# Patient Record
Sex: Male | Born: 1960 | Race: Black or African American | Hispanic: No | Marital: Married | Smoking: Former smoker
Health system: Southern US, Community
[De-identification: ages and names within clinical notes are randomized; demographics above are authoritative.]

## PROBLEM LIST (undated history)

## (undated) DIAGNOSIS — F1990 Other psychoactive substance use, unspecified, uncomplicated: Secondary | ICD-10-CM

## (undated) DIAGNOSIS — Z9981 Dependence on supplemental oxygen: Secondary | ICD-10-CM

## (undated) DIAGNOSIS — J90 Pleural effusion, not elsewhere classified: Secondary | ICD-10-CM

## (undated) DIAGNOSIS — F1010 Alcohol abuse, uncomplicated: Secondary | ICD-10-CM

## (undated) DIAGNOSIS — C801 Malignant (primary) neoplasm, unspecified: Secondary | ICD-10-CM

## (undated) DIAGNOSIS — F17200 Nicotine dependence, unspecified, uncomplicated: Secondary | ICD-10-CM

## (undated) HISTORY — DX: Malignant (primary) neoplasm, unspecified: C80.1

## (undated) HISTORY — PX: OTHER SURGICAL HISTORY: SHX169

---

## 1998-12-31 ENCOUNTER — Observation Stay (HOSPITAL_COMMUNITY): Admission: EM | Admit: 1998-12-31 | Discharge: 1998-12-31 | Payer: Self-pay | Admitting: Emergency Medicine

## 1999-01-13 ENCOUNTER — Emergency Department (HOSPITAL_COMMUNITY): Admission: EM | Admit: 1999-01-13 | Discharge: 1999-01-13 | Payer: Self-pay | Admitting: Emergency Medicine

## 2007-07-17 HISTORY — PX: HERNIA REPAIR: SHX51

## 2015-04-22 ENCOUNTER — Emergency Department (HOSPITAL_COMMUNITY): Payer: Medicaid Other

## 2015-04-22 ENCOUNTER — Inpatient Hospital Stay (HOSPITAL_COMMUNITY)
Admission: EM | Admit: 2015-04-22 | Discharge: 2015-04-28 | DRG: 166 | Disposition: A | Discharge status: Home / Self Care | Payer: Medicaid Other | Attending: Internal Medicine | Admitting: Internal Medicine

## 2015-04-22 ENCOUNTER — Encounter (HOSPITAL_COMMUNITY): Payer: Self-pay | Admitting: Emergency Medicine

## 2015-04-22 DIAGNOSIS — F17200 Nicotine dependence, unspecified, uncomplicated: Secondary | ICD-10-CM | POA: Diagnosis not present

## 2015-04-22 DIAGNOSIS — R079 Chest pain, unspecified: Secondary | ICD-10-CM

## 2015-04-22 DIAGNOSIS — J189 Pneumonia, unspecified organism: Secondary | ICD-10-CM | POA: Diagnosis present

## 2015-04-22 DIAGNOSIS — J441 Chronic obstructive pulmonary disease with (acute) exacerbation: Secondary | ICD-10-CM | POA: Diagnosis present

## 2015-04-22 DIAGNOSIS — F1020 Alcohol dependence, uncomplicated: Secondary | ICD-10-CM | POA: Diagnosis present

## 2015-04-22 DIAGNOSIS — Z6822 Body mass index (BMI) 22.0-22.9, adult: Secondary | ICD-10-CM

## 2015-04-22 DIAGNOSIS — C3411 Malignant neoplasm of upper lobe, right bronchus or lung: Secondary | ICD-10-CM | POA: Diagnosis present

## 2015-04-22 DIAGNOSIS — C801 Malignant (primary) neoplasm, unspecified: Secondary | ICD-10-CM | POA: Diagnosis not present

## 2015-04-22 DIAGNOSIS — J948 Other specified pleural conditions: Secondary | ICD-10-CM | POA: Diagnosis not present

## 2015-04-22 DIAGNOSIS — D72829 Elevated white blood cell count, unspecified: Secondary | ICD-10-CM | POA: Diagnosis not present

## 2015-04-22 DIAGNOSIS — F17210 Nicotine dependence, cigarettes, uncomplicated: Secondary | ICD-10-CM | POA: Diagnosis not present

## 2015-04-22 DIAGNOSIS — J690 Pneumonitis due to inhalation of food and vomit: Secondary | ICD-10-CM | POA: Diagnosis not present

## 2015-04-22 DIAGNOSIS — J9811 Atelectasis: Secondary | ICD-10-CM | POA: Diagnosis not present

## 2015-04-22 DIAGNOSIS — E440 Moderate protein-calorie malnutrition: Secondary | ICD-10-CM | POA: Diagnosis present

## 2015-04-22 DIAGNOSIS — R918 Other nonspecific abnormal finding of lung field: Secondary | ICD-10-CM | POA: Diagnosis not present

## 2015-04-22 DIAGNOSIS — J910 Malignant pleural effusion: Secondary | ICD-10-CM | POA: Diagnosis not present

## 2015-04-22 DIAGNOSIS — J984 Other disorders of lung: Secondary | ICD-10-CM | POA: Diagnosis not present

## 2015-04-22 DIAGNOSIS — J90 Pleural effusion, not elsewhere classified: Secondary | ICD-10-CM

## 2015-04-22 DIAGNOSIS — Z9889 Other specified postprocedural states: Secondary | ICD-10-CM

## 2015-04-22 HISTORY — DX: Nicotine dependence, unspecified, uncomplicated: F17.200

## 2015-04-22 LAB — CBC
HCT: 34 % — ABNORMAL LOW (ref 39.0–52.0)
Hemoglobin: 11.4 g/dL — ABNORMAL LOW (ref 13.0–17.0)
MCH: 29.2 pg (ref 26.0–34.0)
MCHC: 33.5 g/dL (ref 30.0–36.0)
MCV: 87.2 fL (ref 78.0–100.0)
PLATELETS: 641 10*3/uL — AB (ref 150–400)
RBC: 3.9 MIL/uL — AB (ref 4.22–5.81)
RDW: 14.5 % (ref 11.5–15.5)
WBC: 10.4 10*3/uL (ref 4.0–10.5)

## 2015-04-22 LAB — BASIC METABOLIC PANEL
Anion gap: 6 (ref 5–15)
BUN: 10 mg/dL (ref 6–20)
CALCIUM: 8.4 mg/dL — AB (ref 8.9–10.3)
CHLORIDE: 104 mmol/L (ref 101–111)
CO2: 27 mmol/L (ref 22–32)
CREATININE: 0.99 mg/dL (ref 0.61–1.24)
Glucose, Bld: 113 mg/dL — ABNORMAL HIGH (ref 65–99)
Potassium: 4.1 mmol/L (ref 3.5–5.1)
SODIUM: 137 mmol/L (ref 135–145)

## 2015-04-22 LAB — HEPATIC FUNCTION PANEL
ALBUMIN: 2.7 g/dL — AB (ref 3.5–5.0)
ALT: 11 U/L — ABNORMAL LOW (ref 17–63)
AST: 16 U/L (ref 15–41)
Alkaline Phosphatase: 87 U/L (ref 38–126)
BILIRUBIN TOTAL: 0.2 mg/dL — AB (ref 0.3–1.2)
Bilirubin, Direct: <0.1 mg/dL — ABNORMAL LOW (ref 0.1–0.5)
TOTAL PROTEIN: 6.6 g/dL (ref 6.5–8.1)

## 2015-04-22 LAB — TROPONIN I

## 2015-04-22 MED ORDER — IOHEXOL 300 MG/ML  SOLN
75.0000 mL | Freq: Once | INTRAMUSCULAR | Status: AC | PRN
  Administered 2015-04-22: 100 mL via INTRAVENOUS

## 2015-04-22 MED ORDER — SODIUM CHLORIDE 0.9 % IV SOLN
INTRAVENOUS | Status: DC
  Administered 2015-04-22 – 2015-04-23 (×3): via INTRAVENOUS

## 2015-04-22 MED ORDER — DEXTROSE 5 % IV SOLN
1.0000 g | INTRAVENOUS | Status: DC
  Administered 2015-04-22 – 2015-04-26 (×5): 1 g via INTRAVENOUS
  Filled 2015-04-22 (×7): qty 10

## 2015-04-22 MED ORDER — ASPIRIN 81 MG PO CHEW
324.0000 mg | CHEWABLE_TABLET | Freq: Once | ORAL | Status: AC
  Administered 2015-04-22: 324 mg via ORAL
  Filled 2015-04-22: qty 4

## 2015-04-22 MED ORDER — DEXTROSE 5 % IV SOLN
500.0000 mg | INTRAVENOUS | Status: DC
  Administered 2015-04-22 – 2015-04-26 (×5): 500 mg via INTRAVENOUS
  Filled 2015-04-22 (×8): qty 500

## 2015-04-22 MED ORDER — SODIUM CHLORIDE 0.9 % IV BOLUS (SEPSIS)
250.0000 mL | Freq: Once | INTRAVENOUS | Status: AC
Start: 2015-04-22 — End: 2015-04-22
  Administered 2015-04-22: 250 mL via INTRAVENOUS

## 2015-04-22 NOTE — H&P (Signed)
[facility] History and Physical  Marsalis Beaulieu GLO:[id number] DOB: 07-Sep-1960 DOA: 04/22/2015  Referring physician: Emergency department physician PCP: None   Chief Complaint: Chest pain  HPI: Brian Nelson is a 54 y.o. male  With no past medical history comes to the emergency department with the complaints of chest pain on the right side of the chest started since yesterday. The pain is sharp in nature 7 x 10 intensity, radiates to right shoulder. Workup in the emergency department the chest x-ray was concerning about pneumothorax. Patient underwent CTA of the chest which demonstrated a large right pleural effusion concerning about empyema, large infiltrate concerning about pneumonia commented as cannot exclude malignancy. Patient has underlying severe emphysema with multiple bullae largest being 12 cm. Patient smokes 2 packs a day. Patient states, has been having cough for the last 1 month with occasional blood-tinged sputum. Patient also states that has been having poor appetite with some loss of weight. Patient states does not weigh himself regularly. Concerning about the large pleural effusion concerning about empyema and infiltrate pretty much involving the entire right lung patient will need further evaluation with a thoracentesis, pulmonary consult, possible CT surgery consult. Patient denies having any fever. Patient does not have any elevated white blood cell count. Denied any shortness of breath.   Review of Systems:  Constitutional:  Positive for weight loss,  fatigue.  HEENT:  No headaches, Difficulty swallowing,Tooth/dental problems,Sore throat,  No sneezing, itching, ear ache, nasal congestion, post nasal drip,  Cardio-vascular:  Positive for chest pain, no Orthopnea, PND, swelling in lower extremities, anasarca, dizziness, palpitations  GI:  No heartburn, indigestion, abdominal pain, nausea, vomiting, diarrhea, change in bowel habits, loss of appetite  Resp:  No  shortness of breath with exertion or at rest. No excess mucus, no productive cough, No non-productive cough, No coughing up of blood.No change in color of mucus.No wheezing.No chest wall deformity  Skin:  no rash or lesions.  GU:  no dysuria, change in color of urine, no urgency or frequency. No flank pain.  Musculoskeletal:  No joint pain or swelling. No decreased range of motion. No back pain.  Psych:  No change in mood or affect. No depression or anxiety. No memory loss.   History reviewed. No pertinent past medical history. History reviewed. No pertinent past surgical history. Social History:  reports that he has been smoking Cigarettes.  He has been smoking about 2.00 packs per day. He has never used smokeless tobacco. He reports that he drinks alcohol. His drug history is not on file.  No Known Allergies  Family History  Problem Relation Age of Onset  . Diabetes Mother      Prior to Admission medications   Not on File   Physical Exam: Filed Vitals:   04/22/15 2100 04/22/15 2130 04/22/15 2200 04/22/15 2230  BP: 144/81 140/79 158/98 130/86  Pulse: 90 71  81  Temp:      TempSrc:      Resp: '20 20 18 18  '$ Height:      Weight:      SpO2: 98% 95%  96%    Wt Readings from Last 3 Encounters:  04/22/15 68.04 kg (150 lb)    General:  Appears calm and comfortable Eyes: PERRL, normal lids, irises & conjunctiva ENT: grossly normal hearing, lips & tongue Neck: no LAD, masses or thyromegaly Cardiovascular: RRR, no m/r/g. No LE edema. Telemetry: SR, no arrhythmias  Respiratory: Decreased breath sounds in the right lower lobe, bronchial breath sounds  on the right lung Abdomen: soft, ntnd Skin: no rash or induration seen on limited exam Musculoskeletal: grossly normal tone BUE/BLE Psychiatric: grossly normal mood and affect, speech fluent and appropriate Neurologic: grossly non-focal.          Labs on Admission:  Basic Metabolic Panel:  Recent Labs Lab 04/22/15 1953    NA 137  K 4.1  CL 104  CO2 27  GLUCOSE 113*  BUN 10  CREATININE 0.99  CALCIUM 8.4*   Liver Function Tests:  Recent Labs Lab 04/22/15 1953  AST 16  ALT 11*  ALKPHOS 87  BILITOT 0.2*  PROT 6.6  ALBUMIN 2.7*   No results for input(s): LIPASE, AMYLASE in the last 168 hours. No results for input(s): AMMONIA in the last 168 hours. CBC:  Recent Labs Lab 04/22/15 1953  WBC 10.4  HGB 11.4*  HCT 34.0*  MCV 87.2  PLT 641*   Cardiac Enzymes:  Recent Labs Lab 04/22/15 1953  TROPONINI <0.03    BNP (last 3 results) No results for input(s): BNP in the last 8760 hours.  ProBNP (last 3 results) No results for input(s): PROBNP in the last 8760 hours.  CBG: No results for input(s): GLUCAP in the last 168 hours.  Radiological Exams on Admission: Dg Chest 2 View  04/22/2015   CLINICAL DATA:  Right-sided chest pain and shortness of breath since yesterday.  EXAM: CHEST  2 VIEW  COMPARISON:  None.  FINDINGS: Cardiomediastinal silhouette is normal. Mediastinal contours appear intact.  The lungs are hyperinflated, and there is evidence of emphysematous changes with upper lobe predominance. There is a lucency within the left lung apex, which may represent a pneumothorax versus bullous changes. There is a small left pleural effusion. There is a large right pleural effusion. There is near complete opacification of the right upper lobe of the lung.  Osseous structures are without acute abnormality. Soft tissues are grossly normal.  IMPRESSION: Lung emphysema.  Left apical pneumothorax versus bullous changes of the lung parenchyma. Small left pleural effusion.  Large right pleural effusion. Near complete opacification of the right upper lobe of the lung. This may represent infectious consolidation, however endobronchial lesion causing postobstructive pneumonia cannot be excluded.  CT of the chest may be considered.   Electronically Signed   By: Fidela Salisbury M.D.   On: 04/22/2015 21:11    Ct Chest W Contrast  04/22/2015   CLINICAL DATA:  Intermittent right-sided chest pain and shortness of breath beginning yesterday. History of emphysema.  EXAM: CT CHEST WITH CONTRAST  TECHNIQUE: Multidetector CT imaging of the chest was performed during intravenous contrast administration.  CONTRAST:  155m OMNIPAQUE IOHEXOL 300 MG/ML  SOLN  COMPARISON:  Radiography same day  FINDINGS: There is a background pattern of advanced emphysema I with extensive bullous changes in the upper lobes. On the left, there is a dominant bulla measuring 12 cm in diameter. In the left lung, there are a few foci of nodular density measuring up to 1 cm in size which will require follow-up after treatment of this acute episode. Some of these could meet criteria for suspicion of malignancy.  On the right, there is a large effusion primarily loculated at the base worrisome for empyema. There is considerable volume loss in the right lower lung because of that. There is consolidation in the right upper lobe with fluid filling Bas lie at the apex consistent with advanced right upper lobe pneumonia. Certainly, a mass lesion could be hidden and this area  will will need to be evaluated after treatment. There are slightly prominent lymph nodes in the mediastinum that are likely reactive to the inflammatory process.  There is no pneumothorax. Scans in the upper abdomen do not show any abnormality.  IMPRESSION: Advanced right upper lobe pneumonia. Extensive empyema at the right lung base. Compressive atelectasis of the right lung.  Background pattern of centrilobular emphysema with a 12 cm bulla at the left lung apex. Areas of abnormal density in the left lung which will require follow-up after treatment of this acute inflammatory process. Some of these made meet criteria for suspicion for lung cancer.   Electronically Signed   By: Nelson Chimes M.D.   On: 04/22/2015 22:08    EKG: Independently reviewed. Normal sinus  rhythm  Assessment/Plan Active Problems:  Pleural effusion on right Highly concerning about malignancy Cannot excuse infection Transfer the patient to  [facility] Thoracentesis Fluid for analysis for cell count, pH, protein, LDH, cytology, cultures If concerning for empyema patient will need to have CT surgery consult  Right lung infiltrate Keep the patient on antibodies covering for community-acquired pneumonia with Rocephin and Zithromax Concerning about large infiltrate concerning about malignancy Consults pulmonology Patient is afebrile, normal white blood cell count, does not look toxic  COPD exacerbation mild Keep the patient on duonebs, Solu-Medrol  Tobacco abuse Consult with the patient for 4 minutes Keep the patient on nicotine patch  Alcohol use Patient could not quantify Keep the patient on thiamine  Thrombocytosis Most likely reactive  Protein malnutrition mild Has albumin of 2.7   Keep the patient on DVT prophylaxis with Lovenox    Code Status: Full DVT Prophylaxis: Lovenox Family Communication: Patient Disposition Plan: Home Time spent: 60 minutes  [facility] Pager [phone]

## 2015-04-22 NOTE — ED Provider Notes (Addendum)
CSN: [id number]     Arrival date & time 04/22/15  1917 History   First MD Initiated Contact with Patient 04/22/15 1938     Chief Complaint  Patient presents with  . Chest Pain     (Consider location/radiation/quality/duration/timing/severity/associated sxs/prior Treatment) Patient is a 54 y.o. male presenting with chest pain. The history is provided by the patient and the spouse.  Chest Pain Associated symptoms: back pain and shortness of breath   Associated symptoms: no abdominal pain, no cough, no fever, no headache, no nausea and not vomiting    patient with onset of intermittent substernal chest pain yesterday associated with shortness of breath. Pain last no more than 5 minutes. Nonradiating. Patient states not severe may be 6 out of 10 sometimes less. Also with a complaint of right posterior shoulder pain for a month. No fevers no nausea or vomiting no diaphoresis. No appetite change. Patient is a long-term smoker. No history of any cardiac problems however patient does not have a primary care doctor.  History reviewed. No pertinent past medical history. History reviewed. No pertinent past surgical history. Family History  Problem Relation Age of Onset  . Diabetes Mother    Social History  Substance Use Topics  . Smoking status: Current Every Day Smoker -- 2.00 packs/day    Types: Cigarettes  . Smokeless tobacco: Never Used  . Alcohol Use: Yes    Review of Systems  Constitutional: Negative for fever and unexpected weight change.  HENT: Negative for congestion.   Eyes: Negative for visual disturbance.  Respiratory: Positive for shortness of breath. Negative for cough.   Cardiovascular: Positive for chest pain.  Gastrointestinal: Negative for nausea, vomiting, abdominal pain and diarrhea.  Genitourinary: Negative for dysuria.  Musculoskeletal: Positive for back pain.  Skin: Negative for rash.  Neurological: Negative for headaches.  Hematological: Does not bruise/bleed  easily.  Psychiatric/Behavioral: Negative for confusion.      Allergies  Review of patient's allergies indicates no known allergies.  Home Medications   Prior to Admission medications   Not on File   BP 140/79 mmHg  Pulse 71  Temp(Src) 98.6 F (37 C) (Oral)  Resp 20  Ht '5\' 8"'$  (1.727 m)  Wt 150 lb (68.04 kg)  BMI 22.81 kg/m2  SpO2 95% Physical Exam  Constitutional: He appears well-developed and well-nourished. No distress.  HENT:  Head: Normocephalic and atraumatic.  Mouth/Throat: Oropharynx is clear and moist.  Eyes: Conjunctivae and EOM are normal. Pupils are equal, round, and reactive to light.  Neck: Normal range of motion.  Cardiovascular: Normal rate, regular rhythm and normal heart sounds.   No murmur heard. Pulmonary/Chest: Effort normal and breath sounds normal. No respiratory distress.  Decreased breath sounds on the right  Abdominal: Bowel sounds are normal.  Musculoskeletal: Normal range of motion. He exhibits no edema.  Neurological: He is alert. No cranial nerve deficit. He exhibits normal muscle tone. Coordination normal.  Skin: Skin is warm. No rash noted.  Nursing note and vitals reviewed.   ED Course  Procedures (including critical care time) Labs Review Labs Reviewed  BASIC METABOLIC PANEL - Abnormal; Notable for the following:    Glucose, Bld 113 (*)    Calcium 8.4 (*)    All other components within normal limits  CBC - Abnormal; Notable for the following:    RBC 3.90 (*)    Hemoglobin 11.4 (*)    HCT 34.0 (*)    Platelets 641 (*)    All other components within  normal limits  HEPATIC FUNCTION PANEL - Abnormal; Notable for the following:    Albumin 2.7 (*)    ALT 11 (*)    Total Bilirubin 0.2 (*)    Bilirubin, Direct <0.1 (*)    All other components within normal limits  TROPONIN I   Results for orders placed or performed during the hospital encounter of 27/03/50  Basic metabolic panel  Result Value Ref Range   Sodium 137 135 - 145  mmol/L   Potassium 4.1 3.5 - 5.1 mmol/L   Chloride 104 101 - 111 mmol/L   CO2 27 22 - 32 mmol/L   Glucose, Bld 113 (H) 65 - 99 mg/dL   BUN 10 6 - 20 mg/dL   Creatinine, Ser 0.99 0.61 - 1.24 mg/dL   Calcium 8.4 (L) 8.9 - 10.3 mg/dL   GFR calc non Af Amer >60 >60 mL/min   GFR calc Af Amer >60 >60 mL/min   Anion gap 6 5 - 15  CBC  Result Value Ref Range   WBC 10.4 4.0 - 10.5 K/uL   RBC 3.90 (L) 4.22 - 5.81 MIL/uL   Hemoglobin 11.4 (L) 13.0 - 17.0 g/dL   HCT 34.0 (L) 39.0 - 52.0 %   MCV 87.2 78.0 - 100.0 fL   MCH 29.2 26.0 - 34.0 pg   MCHC 33.5 30.0 - 36.0 g/dL   RDW 14.5 11.5 - 15.5 %   Platelets 641 (H) 150 - 400 K/uL  Troponin I  Result Value Ref Range   Troponin I <0.03 <0.031 ng/mL  Hepatic function panel  Result Value Ref Range   Total Protein 6.6 6.5 - 8.1 g/dL   Albumin 2.7 (L) 3.5 - 5.0 g/dL   AST 16 15 - 41 U/L   ALT 11 (L) 17 - 63 U/L   Alkaline Phosphatase 87 38 - 126 U/L   Total Bilirubin 0.2 (L) 0.3 - 1.2 mg/dL   Bilirubin, Direct <0.1 (L) 0.1 - 0.5 mg/dL   Indirect Bilirubin NOT CALCULATED 0.3 - 0.9 mg/dL     Imaging Review Dg Chest 2 View  04/22/2015   CLINICAL DATA:  Right-sided chest pain and shortness of breath since yesterday.  EXAM: CHEST  2 VIEW  COMPARISON:  None.  FINDINGS: Cardiomediastinal silhouette is normal. Mediastinal contours appear intact.  The lungs are hyperinflated, and there is evidence of emphysematous changes with upper lobe predominance. There is a lucency within the left lung apex, which may represent a pneumothorax versus bullous changes. There is a small left pleural effusion. There is a large right pleural effusion. There is near complete opacification of the right upper lobe of the lung.  Osseous structures are without acute abnormality. Soft tissues are grossly normal.  IMPRESSION: Lung emphysema.  Left apical pneumothorax versus bullous changes of the lung parenchyma. Small left pleural effusion.  Large right pleural effusion. Near  complete opacification of the right upper lobe of the lung. This may represent infectious consolidation, however endobronchial lesion causing postobstructive pneumonia cannot be excluded.  CT of the chest may be considered.   Electronically Signed   By: Fidela Salisbury M.D.   On: 04/22/2015 21:11   Ct Chest W Contrast  04/22/2015   CLINICAL DATA:  Intermittent right-sided chest pain and shortness of breath beginning yesterday. History of emphysema.  EXAM: CT CHEST WITH CONTRAST  TECHNIQUE: Multidetector CT imaging of the chest was performed during intravenous contrast administration.  CONTRAST:  179m OMNIPAQUE IOHEXOL 300 MG/ML  SOLN  COMPARISON:  Radiography same day  FINDINGS: There is a background pattern of advanced emphysema I with extensive bullous changes in the upper lobes. On the left, there is a dominant bulla measuring 12 cm in diameter. In the left lung, there are a few foci of nodular density measuring up to 1 cm in size which will require follow-up after treatment of this acute episode. Some of these could meet criteria for suspicion of malignancy.  On the right, there is a large effusion primarily loculated at the base worrisome for empyema. There is considerable volume loss in the right lower lung because of that. There is consolidation in the right upper lobe with fluid filling Bas lie at the apex consistent with advanced right upper lobe pneumonia. Certainly, a mass lesion could be hidden and this area will will need to be evaluated after treatment. There are slightly prominent lymph nodes in the mediastinum that are likely reactive to the inflammatory process.  There is no pneumothorax. Scans in the upper abdomen do not show any abnormality.  IMPRESSION: Advanced right upper lobe pneumonia. Extensive empyema at the right lung base. Compressive atelectasis of the right lung.  Background pattern of centrilobular emphysema with a 12 cm bulla at the left lung apex. Areas of abnormal density in  the left lung which will require follow-up after treatment of this acute inflammatory process. Some of these made meet criteria for suspicion for lung cancer.   Electronically Signed   By: Nelson Chimes M.D.   On: 04/22/2015 22:08   I have personally reviewed and evaluated these images and lab results as part of my medical decision-making.   EKG Interpretation   Date/Time:  Friday April 22 2015 19:33:05 EDT Ventricular Rate:  91 PR Interval:  123 QRS Duration: 55 QT Interval:  364 QTC Calculation: 448 R Axis:   79 Text Interpretation:  Sinus rhythm Ventricular premature complex  Anteroseptal infarct, age indeterminate Lateral leads are also involved  Baseline wander in lead(s) II III aVR aVL aVF V4 V5 No previous ECGs  available Confirmed by Malashia Kamaka  MD, [place] [phone]) on 04/22/2015 8:40:00  PM      MDM   Final diagnoses:  Chest pain, unspecified chest pain type  Pleural effusion  CAP (community acquired pneumonia)    Patient presented with chest pain and shortness of breath that started yesterday. Chest pain was substernal intermittent lasting less than 5 minutes. Still occurring today. Also with pain in the right shoulder right posterior aspect of shoulder that's been there for about a month. No nausea no vomiting.  Troponin was negative EKG without any acute changes other than some ventricular premature complexes. However no old EKG for comparison but with troponin being negative no evidence of an acute cardiac event.  Chest x-ray however showed marked abnormalities questionable left apical pneumothorax versus a bolus and patient is known to have emphysema. Small left pleural effusion large right pleural effusion, that is the area where the patient is having the shoulder pain. Findings very concerning for possible lung neoplasm. Patient is a smoker.  CT with contrast ordered to evaluate further also hepatic enzymes ordered. Patient not hypoxic but may require admission  depending on what patient wants to do close follow-up will be very important and patient currently does not have a primary care doctor. However patient could be referred to hematology oncology clinic here directly.    Fredia Sorrow, MD 04/22/15 2158  CT chest results with contrast discussed with hospitalist. Patient will require admission most  likely [facility] so that he can have interventional radiology and/or cardiothoracic consultation if needed. Patient clinically not acting like a severe or significant advanced pneumonia or empyema however very concerned about neoplastic process. Patient will be started on pneumonia antibodies consistent with community-acquired pneumonia. Will be seen by hospitalist here and she will arrange transfer. EMTALA completed.  Patient without fever not tachycardic not showing any signs of toxicity no hypotension. But further evaluation of the very large pleural effusion and questionable pneumonia and empyema is important and needs to be done during hospitalization.   Fredia Sorrow, MD 04/22/15 2234

## 2015-04-22 NOTE — ED Notes (Signed)
Patient starting having intermittent chest pain and shortness of breath yesterday.

## 2015-04-23 ENCOUNTER — Encounter (HOSPITAL_COMMUNITY): Payer: Self-pay | Admitting: Internal Medicine

## 2015-04-23 ENCOUNTER — Inpatient Hospital Stay (HOSPITAL_COMMUNITY): Payer: Medicaid Other

## 2015-04-23 DIAGNOSIS — E440 Moderate protein-calorie malnutrition: Secondary | ICD-10-CM | POA: Diagnosis present

## 2015-04-23 DIAGNOSIS — F17200 Nicotine dependence, unspecified, uncomplicated: Secondary | ICD-10-CM

## 2015-04-23 DIAGNOSIS — J441 Chronic obstructive pulmonary disease with (acute) exacerbation: Secondary | ICD-10-CM

## 2015-04-23 DIAGNOSIS — J189 Pneumonia, unspecified organism: Secondary | ICD-10-CM | POA: Diagnosis present

## 2015-04-23 DIAGNOSIS — F1020 Alcohol dependence, uncomplicated: Secondary | ICD-10-CM

## 2015-04-23 HISTORY — DX: Nicotine dependence, unspecified, uncomplicated: F17.200

## 2015-04-23 LAB — BODY FLUID CELL COUNT WITH DIFFERENTIAL
EOS FL: 2 %
LYMPHS FL: 87 %
MONOCYTE-MACROPHAGE-SEROUS FLUID: 4 % — AB (ref 50–90)
Neutrophil Count, Fluid: 7 % (ref 0–25)
Total Nucleated Cell Count, Fluid: 954 cu mm (ref 0–1000)

## 2015-04-23 LAB — GRAM STAIN

## 2015-04-23 LAB — BASIC METABOLIC PANEL
ANION GAP: 8 (ref 5–15)
BUN: 7 mg/dL (ref 6–20)
CHLORIDE: 104 mmol/L (ref 101–111)
CO2: 24 mmol/L (ref 22–32)
Calcium: 8.5 mg/dL — ABNORMAL LOW (ref 8.9–10.3)
Creatinine, Ser: 0.88 mg/dL (ref 0.61–1.24)
Glucose, Bld: 135 mg/dL — ABNORMAL HIGH (ref 65–99)
POTASSIUM: 3.8 mmol/L (ref 3.5–5.1)
SODIUM: 136 mmol/L (ref 135–145)

## 2015-04-23 LAB — LACTATE DEHYDROGENASE, PLEURAL OR PERITONEAL FLUID: LD FL: 965 U/L — AB (ref 3–23)

## 2015-04-23 LAB — GLUCOSE, SEROUS FLUID: GLUCOSE FL: 141 mg/dL

## 2015-04-23 LAB — CBC
HEMATOCRIT: 34.2 % — AB (ref 39.0–52.0)
HEMOGLOBIN: 11.5 g/dL — AB (ref 13.0–17.0)
MCH: 29.6 pg (ref 26.0–34.0)
MCHC: 33.6 g/dL (ref 30.0–36.0)
MCV: 87.9 fL (ref 78.0–100.0)
Platelets: 646 10*3/uL — ABNORMAL HIGH (ref 150–400)
RBC: 3.89 MIL/uL — AB (ref 4.22–5.81)
RDW: 14.6 % (ref 11.5–15.5)
WBC: 10.1 10*3/uL (ref 4.0–10.5)

## 2015-04-23 LAB — PROTEIN, BODY FLUID: TOTAL PROTEIN, FLUID: 3.8 g/dL

## 2015-04-23 LAB — APTT: APTT: 37 s (ref 24–37)

## 2015-04-23 LAB — PROTIME-INR
INR: 1.08 (ref 0.00–1.49)
Prothrombin Time: 14.2 seconds (ref 11.6–15.2)

## 2015-04-23 MED ORDER — HYDROCODONE-ACETAMINOPHEN 5-325 MG PO TABS
1.0000 | ORAL_TABLET | ORAL | Status: DC | PRN
  Administered 2015-04-23 – 2015-04-28 (×7): 1 via ORAL
  Filled 2015-04-23 (×7): qty 1

## 2015-04-23 MED ORDER — ALBUTEROL SULFATE (2.5 MG/3ML) 0.083% IN NEBU
2.5000 mg | INHALATION_SOLUTION | Freq: Four times a day (QID) | RESPIRATORY_TRACT | Status: DC
  Administered 2015-04-23: 2.5 mg via RESPIRATORY_TRACT
  Filled 2015-04-23: qty 3

## 2015-04-23 MED ORDER — IPRATROPIUM-ALBUTEROL 0.5-2.5 (3) MG/3ML IN SOLN
3.0000 mL | Freq: Four times a day (QID) | RESPIRATORY_TRACT | Status: DC
  Administered 2015-04-23 – 2015-04-24 (×7): 3 mL via RESPIRATORY_TRACT
  Filled 2015-04-23 (×7): qty 3

## 2015-04-23 MED ORDER — VITAMIN B-1 100 MG PO TABS
100.0000 mg | ORAL_TABLET | Freq: Every day | ORAL | Status: DC
  Administered 2015-04-23 – 2015-04-28 (×6): 100 mg via ORAL
  Filled 2015-04-23 (×6): qty 1

## 2015-04-23 MED ORDER — DOCUSATE SODIUM 100 MG PO CAPS
100.0000 mg | ORAL_CAPSULE | Freq: Two times a day (BID) | ORAL | Status: DC
  Administered 2015-04-23 – 2015-04-28 (×10): 100 mg via ORAL
  Filled 2015-04-23 (×11): qty 1

## 2015-04-23 MED ORDER — ENSURE ENLIVE PO LIQD
237.0000 mL | Freq: Two times a day (BID) | ORAL | Status: DC
  Administered 2015-04-23 – 2015-04-28 (×10): 237 mL via ORAL

## 2015-04-23 MED ORDER — THIAMINE HCL 100 MG/ML IJ SOLN
Freq: Once | INTRAVENOUS | Status: AC
  Administered 2015-04-23: 03:00:00 via INTRAVENOUS
  Filled 2015-04-23: qty 1000

## 2015-04-23 MED ORDER — ENOXAPARIN SODIUM 40 MG/0.4ML ~~LOC~~ SOLN
40.0000 mg | SUBCUTANEOUS | Status: DC
  Administered 2015-04-23 – 2015-04-26 (×4): 40 mg via SUBCUTANEOUS
  Filled 2015-04-23 (×4): qty 0.4

## 2015-04-23 MED ORDER — METHYLPREDNISOLONE SODIUM SUCC 125 MG IJ SOLR
60.0000 mg | Freq: Four times a day (QID) | INTRAMUSCULAR | Status: DC
  Administered 2015-04-23 – 2015-04-24 (×5): 60 mg via INTRAVENOUS
  Filled 2015-04-23 (×5): qty 2

## 2015-04-23 MED ORDER — LIDOCAINE HCL (PF) 1 % IJ SOLN
INTRAMUSCULAR | Status: AC
Start: 2015-04-23 — End: 2015-04-23
  Filled 2015-04-23: qty 10

## 2015-04-23 MED ORDER — NICOTINE 21 MG/24HR TD PT24
21.0000 mg | MEDICATED_PATCH | Freq: Every day | TRANSDERMAL | Status: DC
  Administered 2015-04-24: 21 mg via TRANSDERMAL
  Filled 2015-04-23 (×4): qty 1

## 2015-04-23 MED ORDER — IPRATROPIUM BROMIDE 0.02 % IN SOLN
0.5000 mg | Freq: Four times a day (QID) | RESPIRATORY_TRACT | Status: DC
  Administered 2015-04-23: 0.5 mg via RESPIRATORY_TRACT
  Filled 2015-04-23: qty 2.5

## 2015-04-23 MED ORDER — FOLIC ACID 1 MG PO TABS
1.0000 mg | ORAL_TABLET | Freq: Every day | ORAL | Status: DC
  Administered 2015-04-24 – 2015-04-28 (×5): 1 mg via ORAL
  Filled 2015-04-23 (×5): qty 1

## 2015-04-23 NOTE — Progress Notes (Signed)
[facility] PROGRESS NOTE  Brian Nelson ZOX:[id number] DOB: 1961-02-19 DOA: 04/22/2015 PCP: No primary care provider on file.  Assessment/Plan: #1 right pleural effusion Concern for malignancy. Possible infectious etiology. Patient status post ultrasound-guided diagnostic and therapeutic right thoracentesis 04/23/2015. Interventional radiology. Fluid studies pending. If cytology is positive will need a pulmonary consult for possible bronchoscopy. Continue empiric IV Rocephin and azithromycin. Will add nebulizers. Follow.  #2 mild COPD exacerbation Likely triggered by problem #1. Clinical improvement. Continue IV Solu-Medrol, nebulizer treatments, empiric antibiotics.  #3 probable community-acquired pneumonia Patient afebrile. WBC at 10.1. Continue oxygen, empiric IV Rocephin and azithromycin, nebulizer treatments.  #4 tobacco abuse Tobacco cessation. Nicotine patch.  #5 Alcohol use Stable. No signs of withdrawal. Continue thiamine. Add folic acid.  #6 thrombocytosis Likely reactive. Follow.  #7 moderate malnutrition  Continue on nutritional supplements.  #8 prophylaxis Lovenox for DVT prophylaxis.    Code Status: Full Family Communication: Updated patient. No family at bedside. Disposition Plan: Home when medically stable.   Consultants:  None  Procedures:  Chest x-ray 04/22/2015  CT chest 04/22/2015  Ultrasound-guided diagnostic and therapeutic right thoracentesis 04/23/2015 the interventional radiology Dr. Kathlene Cote.  Antibiotics:  IV Rocephin 04/22/2015  IV azithromycin 04/22/2015  HPI/Subjective: Patient states shortness of breath improved since admission. Patient denies chest pain. Patient states she's feeling better.  Objective: Filed Vitals:   04/23/15 1048  BP: 139/79  Pulse:   Temp:   Resp:     Intake/Output Summary (Last 24 hours) at 04/23/15 1238 Last data filed at 04/23/15 0730  Gross per 24 hour  Intake    360 ml  Output   1100  ml  Net   -740 ml   Filed Weights   04/22/15 1936  Weight: 68.04 kg (150 lb)    Exam:   General:  NAD  Cardiovascular: RRR  Respiratory: Decreased breath sounds on the right greater than left. No wheezing.  Abdomen: Soft, nontender, nondistended, positive bowel sounds.  Musculoskeletal: No edema. Clubbing noted on fingers.  Data Reviewed: Basic Metabolic Panel:  Recent Labs Lab 04/22/15 1953 04/23/15 0421  NA 137 136  K 4.1 3.8  CL 104 104  CO2 27 24  GLUCOSE 113* 135*  BUN 10 7  CREATININE 0.99 0.88  CALCIUM 8.4* 8.5*   Liver Function Tests:  Recent Labs Lab 04/22/15 1953  AST 16  ALT 11*  ALKPHOS 87  BILITOT 0.2*  PROT 6.6  ALBUMIN 2.7*   No results for input(s): LIPASE, AMYLASE in the last 168 hours. No results for input(s): AMMONIA in the last 168 hours. CBC:  Recent Labs Lab 04/22/15 1953 04/23/15 0421  WBC 10.4 10.1  HGB 11.4* 11.5*  HCT 34.0* 34.2*  MCV 87.2 87.9  PLT 641* 646*   Cardiac Enzymes:  Recent Labs Lab 04/22/15 1953  TROPONINI <0.03   BNP (last 3 results) No results for input(s): BNP in the last 8760 hours.  ProBNP (last 3 results) No results for input(s): PROBNP in the last 8760 hours.  CBG: No results for input(s): GLUCAP in the last 168 hours.  Recent Results (from the past 240 hour(s))  Gram stain     Status: None   Collection Time: 04/23/15 11:18 AM  Result Value Ref Range Status   Specimen Description FLUID PLEURAL RIGHT  Final   Special Requests NONE  Final   Gram Stain   Final    FEW WBC PRESENT, PREDOMINANTLY MONONUCLEAR NO ORGANISMS SEEN    Report Status 04/23/2015 FINAL  Final  Studies: Dg Chest 1 View  04/23/2015   CLINICAL DATA:  Right thoracentesis.  EXAM: CHEST 1 VIEW  COMPARISON:  04/22/2015  FINDINGS: Much less pleural density in the right lower chest compared to the yesterday study. Right upper lobe infiltrate with opacified bullous disease at the right apex appears unchanged. Severe  emphysema again noted on the left with increased interstitial markings. No pneumothorax post procedure.  IMPRESSION: No complication following right paracentesis. Much less pleural density at the right lung base. Persistent right upper lobe pneumonia with opacified bullous disease at the apex.   Electronically Signed   By: Nelson Chimes M.D.   On: 04/23/2015 11:23   Dg Chest 2 View  04/22/2015   CLINICAL DATA:  Right-sided chest pain and shortness of breath since yesterday.  EXAM: CHEST  2 VIEW  COMPARISON:  None.  FINDINGS: Cardiomediastinal silhouette is normal. Mediastinal contours appear intact.  The lungs are hyperinflated, and there is evidence of emphysematous changes with upper lobe predominance. There is a lucency within the left lung apex, which may represent a pneumothorax versus bullous changes. There is a small left pleural effusion. There is a large right pleural effusion. There is near complete opacification of the right upper lobe of the lung.  Osseous structures are without acute abnormality. Soft tissues are grossly normal.  IMPRESSION: Lung emphysema.  Left apical pneumothorax versus bullous changes of the lung parenchyma. Small left pleural effusion.  Large right pleural effusion. Near complete opacification of the right upper lobe of the lung. This may represent infectious consolidation, however endobronchial lesion causing postobstructive pneumonia cannot be excluded.  CT of the chest may be considered.   Electronically Signed   By: Fidela Salisbury M.D.   On: 04/22/2015 21:11   Ct Chest W Contrast  04/22/2015   CLINICAL DATA:  Intermittent right-sided chest pain and shortness of breath beginning yesterday. History of emphysema.  EXAM: CT CHEST WITH CONTRAST  TECHNIQUE: Multidetector CT imaging of the chest was performed during intravenous contrast administration.  CONTRAST:  164m OMNIPAQUE IOHEXOL 300 MG/ML  SOLN  COMPARISON:  Radiography same day  FINDINGS: There is a background  pattern of advanced emphysema I with extensive bullous changes in the upper lobes. On the left, there is a dominant bulla measuring 12 cm in diameter. In the left lung, there are a few foci of nodular density measuring up to 1 cm in size which will require follow-up after treatment of this acute episode. Some of these could meet criteria for suspicion of malignancy.  On the right, there is a large effusion primarily loculated at the base worrisome for empyema. There is considerable volume loss in the right lower lung because of that. There is consolidation in the right upper lobe with fluid filling Bas lie at the apex consistent with advanced right upper lobe pneumonia. Certainly, a mass lesion could be hidden and this area will will need to be evaluated after treatment. There are slightly prominent lymph nodes in the mediastinum that are likely reactive to the inflammatory process.  There is no pneumothorax. Scans in the upper abdomen do not show any abnormality.  IMPRESSION: Advanced right upper lobe pneumonia. Extensive empyema at the right lung base. Compressive atelectasis of the right lung.  Background pattern of centrilobular emphysema with a 12 cm bulla at the left lung apex. Areas of abnormal density in the left lung which will require follow-up after treatment of this acute inflammatory process. Some of these made meet criteria for suspicion  for lung cancer.   Electronically Signed   By: Nelson Chimes M.D.   On: 04/22/2015 22:08    Scheduled Meds: . azithromycin  500 mg Intravenous Q24H  . cefTRIAXone (ROCEPHIN)  IV  1 g Intravenous Q24H  . docusate sodium  100 mg Oral BID  . enoxaparin (LOVENOX) injection  40 mg Subcutaneous Q24H  . feeding supplement (ENSURE ENLIVE)  237 mL Oral BID BM  . ipratropium-albuterol  3 mL Nebulization Q6H  . lidocaine (PF)      . methylPREDNISolone (SOLU-MEDROL) injection  60 mg Intravenous Q6H  . nicotine  21 mg Transdermal Daily  . thiamine  100 mg Oral Daily    Continuous Infusions: . sodium chloride 75 mL/hr at 04/23/15 0920    Principal Problem:   Pleural effusion on right Active Problems:   Malnutrition of moderate degree   COPD exacerbation (HCC)   CAP (community acquired pneumonia)   Tobacco use disorder   Alcohol dependence ([place])    Time spent: 22 minutes    THOMPSON,DANIEL M.D. [facility] Pager [phone]. If 7PM-7AM, please contact night-coverage at [email or web address], password [facility] 04/23/2015, 12:38 PM  LOS: 1 day

## 2015-04-23 NOTE — Procedures (Signed)
US guided diagnostic/therapeutic right thoracentesis performed yielding 1.4 liters turbid, amber fluid. The fluid was sent to the lab for preordered studies. F/u CXR pending. No immediate complications.

## 2015-04-23 NOTE — Progress Notes (Signed)
Initial Nutrition Assessment  DOCUMENTATION CODES:  Non-severe (moderate) malnutrition in context of chronic illness  Pt meets criteria for MODERATE MALNUTRITION in the context of CHRONIC ILLNESS as evidenced by Mild muscle wasting and an oral intake that meets < or equal to 75% of estimated needs for > 1 month.  INTERVENTION:  Ensure Enlive po BID, each supplement provides 350 kcal and 20 grams of protein  Magic cup bID with meals, each supplement provides 290 kcal and 9 grams of protein  NUTRITION DIAGNOSIS:  Inadequate oral intake related to poor appetite as evidenced by energy intake < 75% for > or equal to 1 month.  GOAL:  Patient will meet greater than or equal to 90% of their needs  MONITOR:  PO intake, Supplement acceptance, Labs, I & O's, Work up  REASON FOR ASSESSMENT:  Malnutrition Screening Tool    ASSESSMENT:  54 y/o male w/ no significant PMHx presents with complaints of chest pain, wt loss, poor appetite. Evaluation in ED shows large right pleural effusion and infiltrate concerning for empyema r/t PNA vs malnignancy. now s/p thoracentesis.   Pt reports that he simply has not been hungry recently. However, pt has never been a big eater anyway. He states he  usually eats 1 meal a day or sometimes he will only just have small snacks. There has been no n/v/c/d associated with this weight loss. He also has been very stressed out and anxious because he does not know what is going on. He believes this also has had a significant impact on his appetite.  He has had significant fatigue and SOB. He says since his thoracentesis this is much improved.   He reports that he has never been a big person and the most he has ever weighed in his life has been 160-170 lbs. He thought he had lost more weight than he actually has.   He would like to gain some of his weight back. He was agreeable to Ensure BID and Magic Cup Ice cream on L/D Trays  NFPE: Mild muscle wasting in clavicle  region. He is unsure if his collar bone has always been this pronounced.    Diet Order:  Diet regular Room service appropriate?: Yes; Fluid consistency:: Thin  Skin:  Reviewed, no issues  Last BM:  10/7  Height:  Ht Readings from Last 1 Encounters:  04/22/15 '5\' 8"'$  (1.727 m)   Weight:  Wt Readings from Last 1 Encounters:  04/22/15 150 lb (68.04 kg)   Ideal Body Weight:  70 kg  BMI:  Body mass index is 22.81 kg/(m^2).  Estimated Nutritional Needs:  Kcal:  2050-2200 kcals (30-32 kcal/kg) Protein:  82-95 g Pro (1.2-1.4 g/kg bw) Fluid:  2 liters  EDUCATION NEEDS:  No education needs identified at this time  Burtis Junes RD, LDN Nutrition Pager: [phone] 04/23/2015 12:05 PM

## 2015-04-23 NOTE — Evaluation (Signed)
Physical Therapy Evaluation Patient Details Name: Brian Nelson MRN: [id number] DOB: [birth year] Today's Date: 04/23/2015   History of Present Illness  Patient is a 54 yo male admitted 04/22/15 with Rt-sided chest pain.  Patient with Rt pleural effusion.  Clinical Impression  Patient is functioning at independent level with all mobility and gait.  Good balance with gait.  No acute PT needs identified - PT will sign off.  Encouraged ambulation in hallway with nursing.    Follow Up Recommendations No PT follow up;Supervision - Intermittent    Equipment Recommendations  None recommended by PT    Recommendations for Other Services       Precautions / Restrictions Precautions Precautions: None Restrictions Weight Bearing Restrictions: No      Mobility  Bed Mobility Overal bed mobility: Independent                Transfers Overall transfer level: Independent Equipment used: None                Ambulation/Gait Ambulation/Gait assistance: Independent Ambulation Distance (Feet): 300 Feet Assistive device: None Gait Pattern/deviations: WFL(Within Functional Limits)   Gait velocity interpretation: at or above normal speed for age/gender General Gait Details: Patient with good gait pattern, speed, and balance.  Stairs            Wheelchair Mobility    Modified Rankin (Stroke Patients Only)       Balance Overall balance assessment: Independent                                           Pertinent Vitals/Pain Pain Assessment: No/denies pain    Home Living Family/patient expects to be discharged to:: Private residence Living Arrangements: Parent Available Help at Discharge: Family;Available 24 hours/day Type of Home: Mobile home Home Access: Stairs to enter Entrance Stairs-Rails: Psychiatric nurse of Steps: 4 Home Layout: One level Home Equipment: None      Prior Function Level of Independence: Independent                Hand Dominance        Extremity/Trunk Assessment   Upper Extremity Assessment: Overall WFL for tasks assessed           Lower Extremity Assessment: Overall WFL for tasks assessed      Cervical / Trunk Assessment: Normal  Communication   Communication: No difficulties  Cognition Arousal/Alertness: Awake/alert Behavior During Therapy: WFL for tasks assessed/performed Overall Cognitive Status: Within Functional Limits for tasks assessed                      General Comments      Exercises        Assessment/Plan    PT Assessment Patent does not need any further PT services  PT Diagnosis Generalized weakness   PT Problem List    PT Treatment Interventions     PT Goals (Current goals can be found in the Care Plan section) Acute Rehab PT Goals PT Goal Formulation: All assessment and education complete, DC therapy    Frequency     Barriers to discharge        Co-evaluation               End of Session   Activity Tolerance: Patient tolerated treatment well Patient left: in chair;with call bell/phone within reach Nurse Communication: Mobility status (In  chair; No PT needs; Encourage ambulation)         Time: 4967-5916 PT Time Calculation (min) (ACUTE ONLY): 15 min   Charges:   PT Evaluation $Initial PT Evaluation Tier I: 1 Procedure     PT G CodesDespina Nelson 05-11-15, 7:06 PM Brian Nelson. Brian Nelson, Brian Nelson Pager [phone]

## 2015-04-23 NOTE — Progress Notes (Signed)
Utilization Review Completed.  

## 2015-04-24 DIAGNOSIS — D72829 Elevated white blood cell count, unspecified: Secondary | ICD-10-CM | POA: Clinically undetermined

## 2015-04-24 DIAGNOSIS — Z9889 Other specified postprocedural states: Secondary | ICD-10-CM

## 2015-04-24 LAB — BASIC METABOLIC PANEL
Anion gap: 11 (ref 5–15)
BUN: 9 mg/dL (ref 6–20)
CHLORIDE: 102 mmol/L (ref 101–111)
CO2: 24 mmol/L (ref 22–32)
CREATININE: 0.75 mg/dL (ref 0.61–1.24)
Calcium: 9.4 mg/dL (ref 8.9–10.3)
GFR calc non Af Amer: >60 mL/min (ref >60)
Glucose, Bld: 149 mg/dL — ABNORMAL HIGH (ref 65–99)
Potassium: 4.1 mmol/L (ref 3.5–5.1)
SODIUM: 137 mmol/L (ref 135–145)

## 2015-04-24 LAB — CBC
HCT: 33.9 % — ABNORMAL LOW (ref 39.0–52.0)
HEMOGLOBIN: 11.3 g/dL — AB (ref 13.0–17.0)
MCH: 29.2 pg (ref 26.0–34.0)
MCHC: 33.3 g/dL (ref 30.0–36.0)
MCV: 87.6 fL (ref 78.0–100.0)
Platelets: 707 10*3/uL — ABNORMAL HIGH (ref 150–400)
RBC: 3.87 MIL/uL — AB (ref 4.22–5.81)
RDW: 14.7 % (ref 11.5–15.5)
WBC: 22.6 10*3/uL — AB (ref 4.0–10.5)

## 2015-04-24 LAB — MAGNESIUM: MAGNESIUM: 2 mg/dL (ref 1.7–2.4)

## 2015-04-24 MED ORDER — METHYLPREDNISOLONE SODIUM SUCC 125 MG IJ SOLR
60.0000 mg | Freq: Three times a day (TID) | INTRAMUSCULAR | Status: DC
  Administered 2015-04-24 – 2015-04-25 (×3): 60 mg via INTRAVENOUS
  Filled 2015-04-24 (×4): qty 2

## 2015-04-24 MED ORDER — IPRATROPIUM-ALBUTEROL 0.5-2.5 (3) MG/3ML IN SOLN
3.0000 mL | Freq: Two times a day (BID) | RESPIRATORY_TRACT | Status: DC
  Administered 2015-04-25: 3 mL via RESPIRATORY_TRACT
  Filled 2015-04-24: qty 3

## 2015-04-24 NOTE — Progress Notes (Signed)
[facility] PROGRESS NOTE  Brian Nelson PJS:[id number] DOB: [birth year] DOA: 04/22/2015 PCP: No primary care provider on file.  Assessment/Plan: #1 right pleural effusion Concern for malignancy. Per Lights criteria and exudate. Pleural fluid Gram stain with no organisms seen. Pleural fluid culture pending. Cytology is pending. Patient status post ultrasound-guided diagnostic and therapeutic right thoracentesis 04/23/2015. Interventional radiology.  If cytology is positive will need a pulmonary consult for possible bronchoscopy. Continue empiric IV Rocephin and azithromycin, nebulizers. Follow.  #2 mild COPD exacerbation Likely triggered by problem #1. Clinical improvement. Continue IV Solu-Medrol taper, nebulizer treatments, empiric antibiotics.  #3 probable community-acquired pneumonia Patient afebrile. WBC at 22.6. Likely a component of steroids. Continue oxygen, empiric IV Rocephin and azithromycin, nebulizer treatments.  #4 leukocytosis Likely a component of steroids. Patient on empiric IV antibiotics. Pleural fluid Gram stain with no organisms seen. Pleural fluid cultures pending. Patient is afebrile. Monitor with steroids taper.  #5 tobacco abuse Tobacco cessation. Nicotine patch.  #6 Alcohol use Stable. No signs of withdrawal. Continue thiamine and folic acid.  #7 thrombocytosis Likely reactive. Follow.  #8 moderate malnutrition  Continue on nutritional supplements.  #9 prophylaxis Lovenox for DVT prophylaxis.    Code Status: Full Family Communication: Updated patient. No family at bedside. Disposition Plan: Home when medically stable.   Consultants:  None  Procedures:  Chest x-ray 04/22/2015  CT chest 04/22/2015  Ultrasound-guided diagnostic and therapeutic right thoracentesis 04/23/2015 the interventional radiology Dr. Kathlene Cote.  Antibiotics:  IV Rocephin 04/22/2015  IV azithromycin 04/22/2015  HPI/Subjective: Patient states shortness of breath  improved since admission. Patient denies chest pain. Patient states she's feeling better. Patient asking when he can go home.  Objective: Filed Vitals:   04/24/15 1335  BP: 122/68  Pulse: 95  Temp: 98.1 F (36.7 C)  Resp: 18    Intake/Output Summary (Last 24 hours) at 04/24/15 1428 Last data filed at 04/24/15 1230  Gross per 24 hour  Intake 4293.33 ml  Output   3100 ml  Net 1193.33 ml   Filed Weights   04/22/15 1936  Weight: 68.04 kg (150 lb)    Exam:   General:  NAD  Cardiovascular: RRR  Respiratory: Decreased breath sounds on the right greater than left. No wheezing.  Abdomen: Soft, nontender, nondistended, positive bowel sounds.  Musculoskeletal: No edema. Clubbing noted on fingers.  Data Reviewed: Basic Metabolic Panel:  Recent Labs Lab 04/22/15 1953 04/23/15 0421 04/24/15 0330  NA 137 136 137  K 4.1 3.8 4.1  CL 104 104 102  CO2 '27 24 24  '$ GLUCOSE 113* 135* 149*  BUN '10 7 9  '$ CREATININE 0.99 0.88 0.75  CALCIUM 8.4* 8.5* 9.4  MG  --   --  2.0   Liver Function Tests:  Recent Labs Lab 04/22/15 1953  AST 16  ALT 11*  ALKPHOS 87  BILITOT 0.2*  PROT 6.6  ALBUMIN 2.7*   No results for input(s): LIPASE, AMYLASE in the last 168 hours. No results for input(s): AMMONIA in the last 168 hours. CBC:  Recent Labs Lab 04/22/15 1953 04/23/15 0421 04/24/15 0330  WBC 10.4 10.1 22.6*  HGB 11.4* 11.5* 11.3*  HCT 34.0* 34.2* 33.9*  MCV 87.2 87.9 87.6  PLT 641* 646* 707*   Cardiac Enzymes:  Recent Labs Lab 04/22/15 1953  TROPONINI <0.03   BNP (last 3 results) No results for input(s): BNP in the last 8760 hours.  ProBNP (last 3 results) No results for input(s): PROBNP in the last 8760 hours.  CBG: No  results for input(s): GLUCAP in the last 168 hours.  Recent Results (from the past 240 hour(s))  Culture, body fluid-bottle     Status: None (Preliminary result)   Collection Time: 04/23/15 11:18 AM  Result Value Ref Range Status   Specimen  Description FLUID PLEURAL RIGHT  Final   Special Requests NONE  Final   Culture NO GROWTH < 24 HOURS  Final   Report Status PENDING  Incomplete  Gram stain     Status: None   Collection Time: 04/23/15 11:18 AM  Result Value Ref Range Status   Specimen Description FLUID PLEURAL RIGHT  Final   Special Requests NONE  Final   Gram Stain   Final    FEW WBC PRESENT, PREDOMINANTLY MONONUCLEAR NO ORGANISMS SEEN    Report Status 04/23/2015 FINAL  Final     Studies: Dg Chest 1 View  04/23/2015   CLINICAL DATA:  Right thoracentesis.  EXAM: CHEST 1 VIEW  COMPARISON:  04/22/2015  FINDINGS: Much less pleural density in the right lower chest compared to the yesterday study. Right upper lobe infiltrate with opacified bullous disease at the right apex appears unchanged. Severe emphysema again noted on the left with increased interstitial markings. No pneumothorax post procedure.  IMPRESSION: No complication following right paracentesis. Much less pleural density at the right lung base. Persistent right upper lobe pneumonia with opacified bullous disease at the apex.   Electronically Signed   By: Nelson Chimes M.D.   On: 04/23/2015 11:23   Dg Chest 2 View  04/22/2015   CLINICAL DATA:  Right-sided chest pain and shortness of breath since yesterday.  EXAM: CHEST  2 VIEW  COMPARISON:  None.  FINDINGS: Cardiomediastinal silhouette is normal. Mediastinal contours appear intact.  The lungs are hyperinflated, and there is evidence of emphysematous changes with upper lobe predominance. There is a lucency within the left lung apex, which may represent a pneumothorax versus bullous changes. There is a small left pleural effusion. There is a large right pleural effusion. There is near complete opacification of the right upper lobe of the lung.  Osseous structures are without acute abnormality. Soft tissues are grossly normal.  IMPRESSION: Lung emphysema.  Left apical pneumothorax versus bullous changes of the lung  parenchyma. Small left pleural effusion.  Large right pleural effusion. Near complete opacification of the right upper lobe of the lung. This may represent infectious consolidation, however endobronchial lesion causing postobstructive pneumonia cannot be excluded.  CT of the chest may be considered.   Electronically Signed   By: Fidela Salisbury M.D.   On: 04/22/2015 21:11   Ct Chest W Contrast  04/22/2015   CLINICAL DATA:  Intermittent right-sided chest pain and shortness of breath beginning yesterday. History of emphysema.  EXAM: CT CHEST WITH CONTRAST  TECHNIQUE: Multidetector CT imaging of the chest was performed during intravenous contrast administration.  CONTRAST:  165m OMNIPAQUE IOHEXOL 300 MG/ML  SOLN  COMPARISON:  Radiography same day  FINDINGS: There is a background pattern of advanced emphysema I with extensive bullous changes in the upper lobes. On the left, there is a dominant bulla measuring 12 cm in diameter. In the left lung, there are a few foci of nodular density measuring up to 1 cm in size which will require follow-up after treatment of this acute episode. Some of these could meet criteria for suspicion of malignancy.  On the right, there is a large effusion primarily loculated at the base worrisome for empyema. There is considerable volume loss  in the right lower lung because of that. There is consolidation in the right upper lobe with fluid filling Bas lie at the apex consistent with advanced right upper lobe pneumonia. Certainly, a mass lesion could be hidden and this area will will need to be evaluated after treatment. There are slightly prominent lymph nodes in the mediastinum that are likely reactive to the inflammatory process.  There is no pneumothorax. Scans in the upper abdomen do not show any abnormality.  IMPRESSION: Advanced right upper lobe pneumonia. Extensive empyema at the right lung base. Compressive atelectasis of the right lung.  Background pattern of centrilobular  emphysema with a 12 cm bulla at the left lung apex. Areas of abnormal density in the left lung which will require follow-up after treatment of this acute inflammatory process. Some of these made meet criteria for suspicion for lung cancer.   Electronically Signed   By: Nelson Chimes M.D.   On: 04/22/2015 22:08   US Thoracentesis Asp Pleural Space W/img Guide  04/23/2015   INDICATION: Pneumonia, smoker, cough, dyspnea, chest pain, right pleural effusion. Request is made for diagnostic and therapeutic right thoracentesis.  EXAM: ULTRASOUND GUIDED DIAGNOSTIC AND THERAPEUTIC RIGHT THORACENTESIS  COMPARISON:  None.  MEDICATIONS: None  COMPLICATIONS: None immediate  TECHNIQUE: Informed written consent was obtained from the patient after a discussion of the risks, benefits and alternatives to treatment. A timeout was performed prior to the initiation of the procedure.  Initial ultrasound scanning demonstrates a moderate to large right pleural effusion. The lower chest was prepped and draped in the usual sterile fashion. 1% lidocaine was used for local anesthesia.  An ultrasound image was saved for documentation purposes. A 6 Fr Safe-T-Centesis catheter was introduced. The thoracentesis was performed. The catheter was removed and a dressing was applied. The patient tolerated the procedure well without immediate post procedural complication. The patient was escorted to have an upright chest radiograph.  FINDINGS: A total of approximately 1.4 liters of turbid, amber fluid was removed. Requested samples were sent to the laboratory.  IMPRESSION: Successful ultrasound-guided diagnostic and therapeutic right sided thoracentesis yielding 1.4 liters of pleural fluid.  Read by: Rowe Robert, PA-C   Electronically Signed   By: Aletta Edouard M.D.   On: 04/23/2015 11:06    Scheduled Meds: . azithromycin  500 mg Intravenous Q24H  . cefTRIAXone (ROCEPHIN)  IV  1 g Intravenous Q24H  . docusate sodium  100 mg Oral BID  .  enoxaparin (LOVENOX) injection  40 mg Subcutaneous Q24H  . feeding supplement (ENSURE ENLIVE)  237 mL Oral BID BM  . folic acid  1 mg Oral Daily  . ipratropium-albuterol  3 mL Nebulization Q6H  . methylPREDNISolone (SOLU-MEDROL) injection  60 mg Intravenous 3 times per day  . nicotine  21 mg Transdermal Daily  . thiamine  100 mg Oral Daily   Continuous Infusions:    Principal Problem:   Pleural effusion on right Active Problems:   Malnutrition of moderate degree   COPD exacerbation (HCC)   CAP (community acquired pneumonia)   Tobacco use disorder   Alcohol dependence ([place])   Leukocytosis    Time spent: 53 minutes    Remer Couse M.D. [facility] Pager [phone]. If 7PM-7AM, please contact night-coverage at [email or web address], password [facility] 04/24/2015, 2:28 PM  LOS: 2 days

## 2015-04-25 ENCOUNTER — Telehealth (HOSPITAL_BASED_OUTPATIENT_CLINIC_OR_DEPARTMENT_OTHER): Payer: Self-pay | Admitting: Emergency Medicine

## 2015-04-25 ENCOUNTER — Inpatient Hospital Stay (HOSPITAL_COMMUNITY): Payer: Medicaid Other

## 2015-04-25 DIAGNOSIS — J948 Other specified pleural conditions: Secondary | ICD-10-CM

## 2015-04-25 DIAGNOSIS — E440 Moderate protein-calorie malnutrition: Secondary | ICD-10-CM

## 2015-04-25 DIAGNOSIS — J189 Pneumonia, unspecified organism: Secondary | ICD-10-CM

## 2015-04-25 LAB — BASIC METABOLIC PANEL
Anion gap: 9 (ref 5–15)
BUN: 13 mg/dL (ref 6–20)
CALCIUM: 9.2 mg/dL (ref 8.9–10.3)
CO2: 28 mmol/L (ref 22–32)
CREATININE: 0.82 mg/dL (ref 0.61–1.24)
Chloride: 100 mmol/L — ABNORMAL LOW (ref 101–111)
Glucose, Bld: 124 mg/dL — ABNORMAL HIGH (ref 65–99)
Potassium: 4.3 mmol/L (ref 3.5–5.1)
SODIUM: 137 mmol/L (ref 135–145)

## 2015-04-25 LAB — CBC WITH DIFFERENTIAL/PLATELET
BASOS PCT: 0 %
Basophils Absolute: 0 10*3/uL (ref 0.0–0.1)
EOS ABS: 0 10*3/uL (ref 0.0–0.7)
EOS PCT: 0 %
HCT: 32.6 % — ABNORMAL LOW (ref 39.0–52.0)
HEMOGLOBIN: 10.7 g/dL — AB (ref 13.0–17.0)
LYMPHS ABS: 0.7 10*3/uL (ref 0.7–4.0)
Lymphocytes Relative: 3 %
MCH: 28.2 pg (ref 26.0–34.0)
MCHC: 32.8 g/dL (ref 30.0–36.0)
MCV: 86 fL (ref 78.0–100.0)
MONOS PCT: 4 %
Monocytes Absolute: 0.9 10*3/uL (ref 0.1–1.0)
NEUTROS PCT: 93 %
Neutro Abs: 21.3 10*3/uL — ABNORMAL HIGH (ref 1.7–7.7)
PLATELETS: 718 10*3/uL — AB (ref 150–400)
RBC: 3.79 MIL/uL — ABNORMAL LOW (ref 4.22–5.81)
RDW: 14.7 % (ref 11.5–15.5)
WBC: 22.9 10*3/uL — AB (ref 4.0–10.5)

## 2015-04-25 LAB — PH, BODY FLUID: PH, BODY FLUID: 7.4

## 2015-04-25 MED ORDER — IPRATROPIUM-ALBUTEROL 0.5-2.5 (3) MG/3ML IN SOLN: 3.0000 mL | Freq: Four times a day (QID) | RESPIRATORY_TRACT | Status: DC

## 2015-04-25 MED ORDER — IPRATROPIUM-ALBUTEROL 0.5-2.5 (3) MG/3ML IN SOLN: 3.0000 mL | Freq: Four times a day (QID) | RESPIRATORY_TRACT | Status: DC | PRN

## 2015-04-25 MED ORDER — ONDANSETRON HCL 4 MG/2ML IJ SOLN: 4.0000 mg | Freq: Four times a day (QID) | INTRAMUSCULAR | Status: DC | PRN

## 2015-04-25 MED ORDER — METHYLPREDNISOLONE SODIUM SUCC 125 MG IJ SOLR: 60.0000 mg | Freq: Two times a day (BID) | INTRAMUSCULAR | Status: DC

## 2015-04-25 MED ORDER — METHYLPREDNISOLONE SODIUM SUCC 40 MG IJ SOLR
40.0000 mg | INTRAMUSCULAR | Status: DC
  Administered 2015-04-25: 40 mg via INTRAVENOUS
  Filled 2015-04-25 (×2): qty 1

## 2015-04-25 NOTE — Progress Notes (Signed)
OT Cancellation Note  Patient Details Name: Brian Nelson MRN: [id number] DOB: 12/27/1960   Cancelled Treatment:    Reason Eval/Treat Not Completed: OT screened, no needs identified, will sign off  [name] OTR/L [phone] 04/25/2015, 9:52 AM

## 2015-04-25 NOTE — Telephone Encounter (Signed)
Call placed to attending Dr. Irine Seal, patient needs to be trnsferred to Medical tele airborne bed not cardiac tele

## 2015-04-25 NOTE — Progress Notes (Addendum)
Pt transferred to 5N32; reported off to RN on unit. Transferring pt off unit with belongings to the side to 5N32. P. Amo Brian Wilfong RN.

## 2015-04-25 NOTE — Progress Notes (Signed)
[facility] PROGRESS NOTE  Brian Nelson ZYS:[id number] DOB: [birth year] DOA: 04/22/2015 PCP: No primary care provider on file.  Assessment/Plan: #1 right exudative pleural effusion Concern for malignancy. Per Lights criteria exudate. Pleural fluid Gram stain with no organisms seen. Pleural fluid culture pending. Cytology is pending. Patient status post ultrasound-guided diagnostic and therapeutic right thoracentesis 04/23/2015. Interventional radiology.  If cytology is positive may need a bronchoscopy. Continue empiric IV Rocephin and azithromycin, nebulizers. Pulmonary have been consulted and are following.  #2 mild COPD exacerbation Likely triggered by problem #1. Clinical improvement. Continue IV Solu-Medrol taper, nebulizer treatments, empiric antibiotics.  #3 probable RUL community-acquired pneumonia vs malignancy Versus TB  Patient afebrile. WBC at 22.9. Likely a component of steroids.  patient with history of clubbing, tobacco use, history of incarceration. Concern for possible TB and malignancy. Patient has been seen in consultation by pulmonary and recommend possible repeat CT chest, Quant [place], HIV, alpha-1 antitrypsin, sputum AFB. Continue oxygen, empiric IV Rocephin and azithromycin, nebulizer treatments.May need a bronchoscopy. Pulmonary following and appreciate input and recommendations.  #4 leukocytosis Likely a component of steroids. Patient on empiric IV antibiotics. Pleural fluid Gram stain with no organisms seen. Pleural fluid cultures pending. Patient is afebrile. Monitor with steroids taper.  #5 tobacco abuse Tobacco cessation. Nicotine patch.  #6 Alcohol use Stable. No signs of withdrawal. Continue thiamine and folic acid.  #7 thrombocytosis Likely reactive. Follow.  #8 moderate malnutrition  Continue on nutritional supplements.  #9 prophylaxis Lovenox for DVT prophylaxis.    Code Status: Full Family Communication: Updated patient. No family at  bedside. Disposition Plan: Home when medically stable.   Consultants:  Pulmonary: Dr. Lake Bells 04/25/2015  Procedures:  Chest x-ray 04/22/2015, 04/25/2015  CT chest 04/22/2015  Ultrasound-guided diagnostic and therapeutic right thoracentesis 04/23/2015 the interventional radiology Dr. Kathlene Cote.  Antibiotics:  IV Rocephin 04/22/2015  IV azithromycin 04/22/2015  HPI/Subjective: Patient denies SOB. No CP. Feeling better. Nonproductive cough.  Objective: Filed Vitals:   04/25/15 0457  BP: 122/78  Pulse: 83  Temp: 97.9 F (36.6 C)  Resp: 18    Intake/Output Summary (Last 24 hours) at 04/25/15 1343 Last data filed at 04/25/15 0900  Gross per 24 hour  Intake    840 ml  Output    600 ml  Net    240 ml   Filed Weights   04/22/15 1936  Weight: 68.04 kg (150 lb)    Exam:   General:  NAD  Cardiovascular: RRR  Respiratory: Decreased breath sounds on the right greater than left. No wheezing.  Abdomen: Soft, nontender, nondistended, positive bowel sounds.  Musculoskeletal: No edema. Clubbing noted on fingers.  Data Reviewed: Basic Metabolic Panel:  Recent Labs Lab 04/22/15 1953 04/23/15 0421 04/24/15 0330 04/25/15 0421  NA 137 136 137 137  K 4.1 3.8 4.1 4.3  CL 104 104 102 100*  CO2 '27 24 24 28  '$ GLUCOSE 113* 135* 149* 124*  BUN '10 7 9 13  '$ CREATININE 0.99 0.88 0.75 0.82  CALCIUM 8.4* 8.5* 9.4 9.2  MG  --   --  2.0  --    Liver Function Tests:  Recent Labs Lab 04/22/15 1953  AST 16  ALT 11*  ALKPHOS 87  BILITOT 0.2*  PROT 6.6  ALBUMIN 2.7*   No results for input(s): LIPASE, AMYLASE in the last 168 hours. No results for input(s): AMMONIA in the last 168 hours. CBC:  Recent Labs Lab 04/22/15 1953 04/23/15 0421 04/24/15 0330 04/25/15 0421  WBC 10.4 10.1 22.6*  22.9*  NEUTROABS  --   --   --  21.3*  HGB 11.4* 11.5* 11.3* 10.7*  HCT 34.0* 34.2* 33.9* 32.6*  MCV 87.2 87.9 87.6 86.0  PLT 641* 646* 707* 718*   Cardiac Enzymes:  Recent  Labs Lab 04/22/15 1953  TROPONINI <0.03   BNP (last 3 results) No results for input(s): BNP in the last 8760 hours.  ProBNP (last 3 results) No results for input(s): PROBNP in the last 8760 hours.  CBG: No results for input(s): GLUCAP in the last 168 hours.  Recent Results (from the past 240 hour(s))  Culture, body fluid-bottle     Status: None (Preliminary result)   Collection Time: 04/23/15 11:18 AM  Result Value Ref Range Status   Specimen Description FLUID PLEURAL RIGHT  Final   Special Requests NONE  Final   Culture NO GROWTH 2 DAYS  Final   Report Status PENDING  Incomplete  Gram stain     Status: None   Collection Time: 04/23/15 11:18 AM  Result Value Ref Range Status   Specimen Description FLUID PLEURAL RIGHT  Final   Special Requests NONE  Final   Gram Stain   Final    FEW WBC PRESENT, PREDOMINANTLY MONONUCLEAR NO ORGANISMS SEEN    Report Status 04/23/2015 FINAL  Final     Studies: Dg Chest 2 View  04/25/2015   CLINICAL DATA:  Pleural effusion  EXAM: CHEST  2 VIEW  COMPARISON:  04/23/2015  FINDINGS: Dense consolidation again noted in the right upper lobe. Small right pleural effusion. Underlying COPD with bullous changes in the apices. No confluent opacity on the left. Heart is normal size.  IMPRESSION: Small right pleural effusion. Dense consolidation persists in the right upper lobe.  Severe COPD.   Electronically Signed   By: Rolm Baptise M.D.   On: 04/25/2015 11:26    Scheduled Meds: . azithromycin  500 mg Intravenous Q24H  . cefTRIAXone (ROCEPHIN)  IV  1 g Intravenous Q24H  . docusate sodium  100 mg Oral BID  . enoxaparin (LOVENOX) injection  40 mg Subcutaneous Q24H  . feeding supplement (ENSURE ENLIVE)  237 mL Oral BID BM  . folic acid  1 mg Oral Daily  . methylPREDNISolone (SOLU-MEDROL) injection  40 mg Intravenous Q24H  . nicotine  21 mg Transdermal Daily  . thiamine  100 mg Oral Daily   Continuous Infusions:    Principal Problem:   Pleural  effusion on right Active Problems:   Malnutrition of moderate degree   COPD exacerbation (HCC)   CAP (community acquired pneumonia)   Tobacco use disorder   Alcohol dependence ([place])   Leukocytosis   S/P thoracentesis    Time spent: 40 minutes    Glendon Fiser M.D. [facility] Pager [phone]. If 7PM-7AM, please contact night-coverage at [email or web address], password [email or web address] 04/25/2015, 1:43 PM  LOS: 3 days

## 2015-04-25 NOTE — Consult Note (Signed)
Name: Brian Nelson MRN: [id number] DOB: [birth year]    ADMISSION DATE:  04/22/2015 CONSULTATION DATE:  04/25/15  REFERRING MD :  Dr. Grandville Silos   CHIEF COMPLAINT:  Right pleural effusion  BRIEF PATIENT DESCRIPTION: 54 y/o M, 2ppd smoker, with no documented medical history admitted on 10/7 with complaints of right sided chest pain and shortness of breath with exertion.  Patient was found to have an exudative R pleural effusion  SIGNIFICANT EVENTS  10/07  Admit with R sided chest pain, found to have large R pleural effusion, large infiltrate, severe emphysema w multiple bullae 10/08  R Thoracentesis per IR (exudate).  Pleural fluid:  1.4 L turbid, amber,LD 965, 87% lymphs, mesothelial cells+, protein 3.8  STUDIES:  10/10  CT Chest >> advanced RUL PNA, extensive empyema at R lung base, compressive atx of R lung, background pattern of centrilobular emphysema with 12 cm bulla at L lung base   HISTORY OF PRESENT ILLNESS:  54 y/o M, 2ppd smoker / marijuana abuse (started at age 54, quit for 7 years while in prison, released in 2010), no known medical history, who presented to [facility] on 10/7 with complaints of right sided chest pain and shortness of breath with exertion.    The patient reports he lives with his mother in a trailer park and works as a Horticulturist, commercial.  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  He was previously incarcerated for 7 years - he quit smoking during that time and was reportedly TB negative.  On 10/7 that he had difficulty walking to the mailbox due to shortness of breath which he normally does not experience.  Upon questioning, he reports he has had approximately 10 lb weight loss over the few months and cough (typically non-productive) for one year.  The patient reports one episode of hemoptysis but none since.  He denies fevers, chills, & night sweats.  Upon admission, CXR demonstrated left emphysema and questionable pneumothorax, large  R effusion, near complete opacification of RUL.  He was further evaluated with a CTA of the chest which advanced RUL PNA, extensive empyema at R lung base, compressive atx of R lung, background pattern of centrilobular emphysema with 12 cm bulla at L lung base.  He was admitted per [facility] and treated for CAP with rocephin & azithromycin.  On 10/8, he underwent a R thoracentesis which yeilded 1.4 ml of turbid amber fluid consistent with an exudate by Light's Criteria.  The patient has noted a decreased appetite over the past few months but reports it is back to normal.  PCCM consulted for evaluation of effusion & emphysema.   PAST MEDICAL HISTORY :   has a past medical history of Tobacco use disorder (04/23/2015).  has no past surgical history on file.   Prior to Admission medications   Not on File   No Known Allergies  FAMILY HISTORY:  family history includes Diabetes in his mother.   SOCIAL HISTORY:  reports that he has been smoking Cigarettes.  He has been smoking about 2.00 packs per day. He has never used smokeless tobacco. He reports that he drinks alcohol.  REVIEW OF SYSTEMS:   Constitutional: Negative for fever, chills, weight loss, malaise/fatigue and diaphoresis.  HENT: Negative for hearing loss, ear pain, nosebleeds, congestion, sore throat, neck pain, tinnitus and ear discharge.   Eyes: Negative for blurred vision, double vision, photophobia, pain, discharge and redness.  Respiratory: Negative for sputum production,  wheezing and stridor.  SEE HPI - positive for non-productive cough, shortness of breath with exertion and one episode of hemoptysis.  Cardiovascular: Negative for chest pain, palpitations, orthopnea, claudication, leg swelling and PND.  Gastrointestinal: Negative for heartburn, nausea, vomiting, abdominal pain, diarrhea, constipation, blood in stool and melena.  Genitourinary: Negative for dysuria, urgency, frequency, hematuria and flank pain.  Musculoskeletal: Negative  for myalgias, back pain, joint pain and falls.  Skin: Negative for itching and rash.  Neurological: Negative for dizziness, tingling, tremors, sensory change, speech change, focal weakness, seizures, loss of consciousness, weakness and headaches.  Endo/Heme/Allergies: Negative for environmental allergies and polydipsia. Does not bruise/bleed easily.  SUBJECTIVE:   VITAL SIGNS: Temp:  [97.9 F (36.6 C)-98.3 F (36.8 C)] 97.9 F (36.6 C) (10/10 0457) Pulse Rate:  [83-103] 83 (10/10 0457) Resp:  [18] 18 (10/10 0457) BP: (122-134)/(68-78) 122/78 mmHg (10/10 0457) SpO2:  [94 %-97 %] 97 % (10/10 0800)  PHYSICAL EXAMINATION: General:  Thin adult male in NAD Neuro:  AAOx4, speech clear, MAE  HEENT:  MM pink/moist, no JVD Cardiovascular:  s1s2 rrr, no m/r/g  Lungs:  resp's even/non-labored, lungs bilaterally distant Abdomen:  Flat, soft, NTND Musculoskeletal:  No acute deformities Skin:  Warm/dry, no edema, no rashes or lesions Extremities: clubbing of nails   Recent Labs Lab 04/23/15 0421 04/24/15 0330 04/25/15 0421  NA 136 137 137  K 3.8 4.1 4.3  CL 104 102 100*  CO2 '24 24 28  '$ BUN '7 9 13  '$ CREATININE 0.88 0.75 0.82  GLUCOSE 135* 149* 124*    Recent Labs Lab 04/23/15 0421 04/24/15 0330 04/25/15 0421  HGB 11.5* 11.3* 10.7*  HCT 34.2* 33.9* 32.6*  WBC 10.1 22.6* 22.9*  PLT 646* 707* 718*   Dg Chest 1 View  04/23/2015   CLINICAL DATA:  Right thoracentesis.  EXAM: CHEST 1 VIEW  COMPARISON:  04/22/2015  FINDINGS: Much less pleural density in the right lower chest compared to the yesterday study. Right upper lobe infiltrate with opacified bullous disease at the right apex appears unchanged. Severe emphysema again noted on the left with increased interstitial markings. No pneumothorax post procedure.  IMPRESSION: No complication following right paracentesis. Much less pleural density at the right lung base. Persistent right upper lobe pneumonia with opacified bullous disease at  the apex.   Electronically Signed   By: Nelson Chimes M.D.   On: 04/23/2015 11:23   US Thoracentesis Asp Pleural Space W/img Guide  04/23/2015   INDICATION: Pneumonia, smoker, cough, dyspnea, chest pain, right pleural effusion. Request is made for diagnostic and therapeutic right thoracentesis.  EXAM: ULTRASOUND GUIDED DIAGNOSTIC AND THERAPEUTIC RIGHT THORACENTESIS  COMPARISON:  None.  MEDICATIONS: None  COMPLICATIONS: None immediate  TECHNIQUE: Informed written consent was obtained from the patient after a discussion of the risks, benefits and alternatives to treatment. A timeout was performed prior to the initiation of the procedure.  Initial ultrasound scanning demonstrates a moderate to large right pleural effusion. The lower chest was prepped and draped in the usual sterile fashion. 1% lidocaine was used for local anesthesia.  An ultrasound image was saved for documentation purposes. A 6 Fr Safe-T-Centesis catheter was introduced. The thoracentesis was performed. The catheter was removed and a dressing was applied. The patient tolerated the procedure well without immediate post procedural complication. The patient was escorted to have an upright chest radiograph.  FINDINGS: A total of approximately 1.4 liters of turbid, amber fluid was removed. Requested samples were sent to the laboratory.  IMPRESSION: Successful ultrasound-guided diagnostic and therapeutic right sided thoracentesis yielding 1.4 liters of pleural fluid.  Read by: Rowe Robert, PA-C   Electronically Signed   By: Aletta Edouard M.D.   On: 04/23/2015 11:06    ASSESSMENT / PLAN:   Right Exudative Pleural Effusion - ddx includes infection vs malignancy.  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    Plan: Await pleural cytology.  Pending am CXR, we may repeat a contrasted CT of the chest to review assist in differentiating pleural findings He may need TCTS consultation pending above.   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   Mr. Samons is an ex-prisoner, active smoker with clubbing, a loculated effusion vs mass vs complicated pneumonia in the right upper lobe in the setting of weight loss and hemoptysis.  He has had a cough for several months.    On exam he has rhonchi in the right base, normal CV exam and he has digital clubbing.  CT chest images personally reviewed: right lower lobe loculated effusion with consolidation in the upper lobes> either necrotic pneumonia or pneumonia with consolidation in the right upper lobe or fluid filled bullae.    Lymphocyte predominant pleural effusion  Impression/Plan Given his weight loss, smoking history and long duration of his symptoms my ddx includes lung cancer vs TB.  I suppose this could be an atypical presentation of acute community acquired pneumonia but the clubbing and weight loss argue against this. Centrilobular emphysema  -respiratory precaution -f/u repeat CXR today> may need repeat CT chest to assess extent of residual ?mass vs loculated effusion? -send quanitferon gold, HIV, alpha-1 -Sputum AFB  Depending on blood work results, may need bronchoscopy later this week.  Roselie Awkward, MD [place] [facility] Pager: [phone] Cell: [phone] After 3pm or if no response, call [phone]

## 2015-04-26 DIAGNOSIS — R918 Other nonspecific abnormal finding of lung field: Secondary | ICD-10-CM

## 2015-04-26 LAB — BASIC METABOLIC PANEL
Anion gap: 8 (ref 5–15)
BUN: 15 mg/dL (ref 6–20)
CHLORIDE: 98 mmol/L — AB (ref 101–111)
CO2: 30 mmol/L (ref 22–32)
Calcium: 9.3 mg/dL (ref 8.9–10.3)
Creatinine, Ser: 0.82 mg/dL (ref 0.61–1.24)
GFR calc Af Amer: >60 mL/min (ref >60)
GFR calc non Af Amer: >60 mL/min (ref >60)
GLUCOSE: 87 mg/dL (ref 65–99)
POTASSIUM: 3.9 mmol/L (ref 3.5–5.1)
Sodium: 136 mmol/L (ref 135–145)

## 2015-04-26 LAB — CBC WITH DIFFERENTIAL/PLATELET
Basophils Absolute: 0 10*3/uL (ref 0.0–0.1)
Basophils Relative: 0 %
EOS PCT: 0 %
Eosinophils Absolute: 0.1 10*3/uL (ref 0.0–0.7)
HCT: 34.4 % — ABNORMAL LOW (ref 39.0–52.0)
HEMOGLOBIN: 11.5 g/dL — AB (ref 13.0–17.0)
LYMPHS ABS: 2.3 10*3/uL (ref 0.7–4.0)
LYMPHS PCT: 13 %
MCH: 29.3 pg (ref 26.0–34.0)
MCHC: 33.4 g/dL (ref 30.0–36.0)
MCV: 87.5 fL (ref 78.0–100.0)
Monocytes Absolute: 2.2 10*3/uL — ABNORMAL HIGH (ref 0.1–1.0)
Monocytes Relative: 12 %
Neutro Abs: 13.2 10*3/uL — ABNORMAL HIGH (ref 1.7–7.7)
Neutrophils Relative %: 75 %
PLATELETS: 694 10*3/uL — AB (ref 150–400)
RBC: 3.93 MIL/uL — AB (ref 4.22–5.81)
RDW: 14.8 % (ref 11.5–15.5)
WBC: 17.7 10*3/uL — AB (ref 4.0–10.5)

## 2015-04-26 LAB — MAGNESIUM: Magnesium: 2 mg/dL (ref 1.7–2.4)

## 2015-04-26 LAB — HIV ANTIBODY (ROUTINE TESTING W REFLEX): HIV Screen 4th Generation wRfx: NONREACTIVE

## 2015-04-26 LAB — ALPHA-1-ANTITRYPSIN: A-1 Antitrypsin, Ser: 239 mg/dL — ABNORMAL HIGH (ref 90–200)

## 2015-04-26 MED ORDER — ENOXAPARIN SODIUM 40 MG/0.4ML ~~LOC~~ SOLN: 40.0000 mg | SUBCUTANEOUS | Status: DC

## 2015-04-26 NOTE — Progress Notes (Signed)
   Name: Brian Nelson MRN: [id number] DOB: [birth year]    ADMISSION DATE:  04/22/2015 CONSULTATION DATE:  04/25/15  REFERRING MD :  Dr. Grandville Silos   CHIEF COMPLAINT:  Right pleural effusion  BRIEF PATIENT DESCRIPTION: 54 y/o M, 2ppd smoker, with no documented medical history admitted on 10/7 with complaints of right sided chest pain and shortness of breath with exertion.  Patient was found to have an exudative R pleural effusion  SIGNIFICANT EVENTS  10/07  Admit with R sided chest pain, found to have large R pleural effusion, large infiltrate, severe emphysema w multiple bullae 10/08  R Thoracentesis per IR (exudate).  Pleural fluid:  1.4 L turbid, amber,LD 965, 87% lymphs, mesothelial cells+, protein 3.8  STUDIES:  10/10  CT Chest >> advanced RUL PNA, extensive empyema at R lung base, compressive atx of R lung, background pattern of centrilobular emphysema with 12 cm bulla at L lung base   SUBJECTIVE:  Feels well, not coughing up anything  VITAL SIGNS: Temp:  [98 F (36.7 C)-98.8 F (37.1 C)] 98.8 F (37.1 C) (10/11 0547) Pulse Rate:  [67-92] 67 (10/11 0547) Resp:  [16-18] 18 (10/11 0547) BP: (112-141)/(65-90) 129/79 mmHg (10/11 0547) SpO2:  [97 %-100 %] 98 % (10/11 0547)  PHYSICAL EXAMINATION: General:  No distress HEENT: NCAT OP clear PULM: CTA B CV: RRR, no mgr GI: BS+, soft, nontender MSK: normal bulk and tone Neuro: Awake and alert, no distress   Recent Labs Lab 04/24/15 0330 04/25/15 0421 04/26/15 0552  NA 137 137 136  K 4.1 4.3 3.9  CL 102 100* 98*  CO2 '24 28 30  '$ BUN '9 13 15  '$ CREATININE 0.75 0.82 0.82  GLUCOSE 149* 124* 87    Recent Labs Lab 04/24/15 0330 04/25/15 0421 04/26/15 0552  HGB 11.3* 10.7* 11.5*  HCT 33.9* 32.6* 34.4*  WBC 22.6* 22.9* 17.7*  PLT 707* 718* 694*   Dg Chest 2 View  04/25/2015   CLINICAL DATA:  Pleural effusion  EXAM: CHEST  2 VIEW  COMPARISON:  04/23/2015  FINDINGS: Dense consolidation again noted in the right upper lobe.  Small right pleural effusion. Underlying COPD with bullous changes in the apices. No confluent opacity on the left. Heart is normal size.  IMPRESSION: Small right pleural effusion. Dense consolidation persists in the right upper lobe.  Severe COPD.   Electronically Signed   By: Rolm Baptise M.D.   On: 04/25/2015 11:26    ASSESSMENT / PLAN:   Right Exudative Pleural Effusion - ddx includes infection vs malignancy.  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

## 2015-04-26 NOTE — Progress Notes (Signed)
[facility] PROGRESS NOTE  Brian Nelson XTK:[id number] DOB: September 04, 1960 DOA: 04/22/2015 PCP: No primary care provider on file.  Brief interval summary Brian Nelson is a 54 y.o. male  With no past medical history presented to the emergency department with the complaints of chest pain on the right side of the chest started since yesterday. The pain is sharp in nature 7 x 10 intensity, radiated to right shoulder. Workup in the emergency department the chest x-ray was concerning about pneumothorax. Patient underwent CTA of the chest which demonstrated a large right pleural effusion concerning about empyema, large infiltrate concerning about pneumonia commented as cannot exclude malignancy. Patient has underlying severe emphysema with multiple bullae largest being 12 cm. Patient smokes 2 packs a day. Patient stated, has been having cough for the last 1 month with occasional blood-tinged sputum. Patient also stated that has been having poor appetite with some loss of weight. Patient stated does not weigh himself regularly. Concerning about the large pleural effusion concerning about empyema and infiltrate pretty much involving the entire right lung patient will need further evaluation with a thoracentesis, pulmonary consult, possible CT surgery consult. Patient denied having any fever. Patient does not have any elevated white blood cell count. Denied any shortness of breath.  Patient was hospitalized. Placed on telemetry and patient underwent thoracentesis per interventional radiology which per lights criteria was consistent with an exudate. Gram stain and culture had no growth. Cytology pending. Patient was placed empirically on IV antibiotics, nebulizer treatments, steroid taper. Pulmonary was consulted and assessed the patient and due to patient's history of incarceration, weight loss, cough, upper lobe findings there was concern for reactivation TB versus malignancy. Patient was subsequently transferred  to a negative pressure room. Sputum was ordered for AF bees, QuantiFERON gold was also ordered. Patient was placed on airborne isolation. Patient to undergo bronchoscopy per pulmonary tomorrow, 04/27/2015. Patient currently stable.       Assessment/Plan: #1 right exudative pleural effusion Concern for malignancy. Per Lights criteria exudate. Pleural fluid Gram stain with no organisms seen. Pleural fluid culture pending. Cytology is pending. Patient status post ultrasound-guided diagnostic and therapeutic right thoracentesis 04/23/2015. Interventional radiology.  If cytology is positive may need a bronchoscopy. Continue empiric IV Rocephin and azithromycin, nebulizers. Patient for bronchoscopy tomorrow. Pulmonary following.   #2 mild COPD exacerbation Likely triggered by problem #1. Clinical improvement. Steroids have been discontinued by pulmonary. Continue  nebulizer treatments, empiric antibiotics.  #3 probable RUL community-acquired pneumonia vs malignancy Versus TB  Patient afebrile. WBC at 22.9. Likely a component of steroids.  patient with history of clubbing, tobacco use, history of incarceration. Concern for possible TB and malignancy. Patient has been seen in consultation by pulmonary and recommend possible repeat CT chest, Quantferon gold pending, HIV negative, alpha-1 antitrypsin WNL, sputum AFB pending. Continue oxygen, empiric IV Rocephin and azithromycin, nebulizer treatments. Patient for bronchoscopy tomorrow for further evaluation. Pulmonary following and appreciate input and recommendations.  #4 leukocytosis Likely a component of steroids. WBC trending down. Patient on empiric IV antibiotics. Pleural fluid Gram stain with no organisms seen. Pleural fluid cultures pending. Patient is afebrile. Steroids have been discontinued per pulmonary.   #5 tobacco abuse Tobacco cessation. Nicotine patch.  #6 Alcohol use Stable. No signs of withdrawal. Continue thiamine and folic  acid.  #7 thrombocytosis Likely reactive. Follow.  #8 moderate malnutrition  Continue on nutritional supplements.  #9 prophylaxis SCDs for DVT prophylaxis.    Code Status: Full Family Communication: Updated patient. No family at bedside.  Disposition Plan: Home when medically stable, and per pulmonary.   Consultants:  Pulmonary: Dr. Lake Bells 04/25/2015  Procedures:  Chest x-ray 04/22/2015, 04/25/2015  CT chest 04/22/2015  Ultrasound-guided diagnostic and therapeutic right thoracentesis 04/23/2015 the interventional radiology Dr. Kathlene Cote.  Antibiotics:  IV Rocephin 04/22/2015  IV azithromycin 04/22/2015  HPI/Subjective: Patient states SOB improved. No CP. Feeling better. Nonproductive cough. Patient asking for food.Patient asking when he can go home.  Objective: Filed Vitals:   04/26/15 1300  BP: 116/64  Pulse: 84  Temp: 98.6 F (37 C)  Resp: 18    Intake/Output Summary (Last 24 hours) at 04/26/15 1556 Last data filed at 04/26/15 0600  Gross per 24 hour  Intake    720 ml  Output      0 ml  Net    720 ml   Filed Weights   04/22/15 1936  Weight: 68.04 kg (150 lb)    Exam:   General:  NAD  Cardiovascular: RRR  Respiratory: Decreased breath sounds on the right upper lobe greater than left. No wheezing.  Abdomen: Soft, nontender, nondistended, positive bowel sounds.  Musculoskeletal: No edema. Clubbing noted on fingers.  Data Reviewed: Basic Metabolic Panel:  Recent Labs Lab 04/22/15 1953 04/23/15 0421 04/24/15 0330 04/25/15 0421 04/26/15 0552  NA 137 136 137 137 136  K 4.1 3.8 4.1 4.3 3.9  CL 104 104 102 100* 98*  CO2 '27 24 24 28 30  '$ GLUCOSE 113* 135* 149* 124* 87  BUN '10 7 9 13 15  '$ CREATININE 0.99 0.88 0.75 0.82 0.82  CALCIUM 8.4* 8.5* 9.4 9.2 9.3  MG  --   --  2.0  --  2.0   Liver Function Tests:  Recent Labs Lab 04/22/15 1953  AST 16  ALT 11*  ALKPHOS 87  BILITOT 0.2*  PROT 6.6  ALBUMIN 2.7*   No results for  input(s): LIPASE, AMYLASE in the last 168 hours. No results for input(s): AMMONIA in the last 168 hours. CBC:  Recent Labs Lab 04/22/15 1953 04/23/15 0421 04/24/15 0330 04/25/15 0421 04/26/15 0552  WBC 10.4 10.1 22.6* 22.9* 17.7*  NEUTROABS  --   --   --  21.3* 13.2*  HGB 11.4* 11.5* 11.3* 10.7* 11.5*  HCT 34.0* 34.2* 33.9* 32.6* 34.4*  MCV 87.2 87.9 87.6 86.0 87.5  PLT 641* 646* 707* 718* 694*   Cardiac Enzymes:  Recent Labs Lab 04/22/15 1953  TROPONINI <0.03   BNP (last 3 results) No results for input(s): BNP in the last 8760 hours.  ProBNP (last 3 results) No results for input(s): PROBNP in the last 8760 hours.  CBG: No results for input(s): GLUCAP in the last 168 hours.  Recent Results (from the past 240 hour(s))  Culture, body fluid-bottle     Status: None (Preliminary result)   Collection Time: 04/23/15 11:18 AM  Result Value Ref Range Status   Specimen Description FLUID PLEURAL RIGHT  Final   Special Requests NONE  Final   Culture NO GROWTH 3 DAYS  Final   Report Status PENDING  Incomplete  Gram stain     Status: None   Collection Time: 04/23/15 11:18 AM  Result Value Ref Range Status   Specimen Description FLUID PLEURAL RIGHT  Final   Special Requests NONE  Final   Gram Stain   Final    FEW WBC PRESENT, PREDOMINANTLY MONONUCLEAR NO ORGANISMS SEEN    Report Status 04/23/2015 FINAL  Final     Studies: Dg Chest 2 View  04/25/2015  CLINICAL DATA:  Pleural effusion  EXAM: CHEST  2 VIEW  COMPARISON:  04/23/2015  FINDINGS: Dense consolidation again noted in the right upper lobe. Small right pleural effusion. Underlying COPD with bullous changes in the apices. No confluent opacity on the left. Heart is normal size.  IMPRESSION: Small right pleural effusion. Dense consolidation persists in the right upper lobe.  Severe COPD.   Electronically Signed   By: Rolm Baptise M.D.   On: 04/25/2015 11:26    Scheduled Meds: . azithromycin  500 mg Intravenous Q24H  .  cefTRIAXone (ROCEPHIN)  IV  1 g Intravenous Q24H  . docusate sodium  100 mg Oral BID  . feeding supplement (ENSURE ENLIVE)  237 mL Oral BID BM  . folic acid  1 mg Oral Daily  . nicotine  21 mg Transdermal Daily  . thiamine  100 mg Oral Daily   Continuous Infusions:    Principal Problem:   Pleural effusion on right Active Problems:   Malnutrition of moderate degree   COPD exacerbation (HCC)   CAP (community acquired pneumonia)   Tobacco use disorder   Alcohol dependence ([place])   Leukocytosis   S/P thoracentesis    Time spent: 18 minutes    Sarabi Sockwell M.D. [facility] Pager [phone]. If 7PM-7AM, please contact night-coverage at [email or web address], password [facility] 04/26/2015, 3:56 PM  LOS: 4 days

## 2015-04-27 ENCOUNTER — Inpatient Hospital Stay (HOSPITAL_COMMUNITY): Payer: Medicaid Other

## 2015-04-27 ENCOUNTER — Encounter (HOSPITAL_COMMUNITY)
Admission: EM | Disposition: A | Discharge status: Home / Self Care | Payer: Self-pay | Source: Home / Self Care | Attending: Internal Medicine

## 2015-04-27 ENCOUNTER — Encounter (HOSPITAL_COMMUNITY): Payer: Self-pay | Admitting: Radiology

## 2015-04-27 DIAGNOSIS — F17200 Nicotine dependence, unspecified, uncomplicated: Secondary | ICD-10-CM

## 2015-04-27 DIAGNOSIS — J984 Other disorders of lung: Secondary | ICD-10-CM

## 2015-04-27 HISTORY — PX: VIDEO BRONCHOSCOPY: SHX5072

## 2015-04-27 LAB — BASIC METABOLIC PANEL
Anion gap: 11 (ref 5–15)
BUN: 14 mg/dL (ref 6–20)
CALCIUM: 9 mg/dL (ref 8.9–10.3)
CHLORIDE: 95 mmol/L — AB (ref 101–111)
CO2: 27 mmol/L (ref 22–32)
CREATININE: 0.94 mg/dL (ref 0.61–1.24)
GFR calc Af Amer: >60 mL/min (ref >60)
GFR calc non Af Amer: >60 mL/min (ref >60)
Glucose, Bld: 89 mg/dL (ref 65–99)
Potassium: 4.2 mmol/L (ref 3.5–5.1)
SODIUM: 133 mmol/L — AB (ref 135–145)

## 2015-04-27 LAB — CBC WITH DIFFERENTIAL/PLATELET
Basophils Absolute: 0.1 10*3/uL (ref 0.0–0.1)
Basophils Relative: 1 %
EOS ABS: 1 10*3/uL — AB (ref 0.0–0.7)
EOS PCT: 8 %
HCT: 35.2 % — ABNORMAL LOW (ref 39.0–52.0)
HEMOGLOBIN: 12 g/dL — AB (ref 13.0–17.0)
LYMPHS ABS: 1.9 10*3/uL (ref 0.7–4.0)
Lymphocytes Relative: 14 %
MCH: 29.1 pg (ref 26.0–34.0)
MCHC: 34.1 g/dL (ref 30.0–36.0)
MCV: 85.2 fL (ref 78.0–100.0)
MONO ABS: 2.2 10*3/uL — AB (ref 0.1–1.0)
MONOS PCT: 16 %
Neutro Abs: 8.4 10*3/uL — ABNORMAL HIGH (ref 1.7–7.7)
Neutrophils Relative %: 61 %
PLATELETS: 681 10*3/uL — AB (ref 150–400)
RBC: 4.13 MIL/uL — ABNORMAL LOW (ref 4.22–5.81)
RDW: 14.7 % (ref 11.5–15.5)
WBC: 13.6 10*3/uL — ABNORMAL HIGH (ref 4.0–10.5)

## 2015-04-27 LAB — QUANTIFERON IN TUBE
QFT TB AG MINUS NIL VALUE: <0 IU/mL
QUANTIFERON MITOGEN VALUE: 0.11 [IU]/mL
QUANTIFERON NIL VALUE: 0.08 [IU]/mL
QUANTIFERON TB AG VALUE: 0.05 [IU]/mL
QUANTIFERON TB GOLD: UNDETERMINED

## 2015-04-27 LAB — QUANTIFERON TB GOLD ASSAY (BLOOD)

## 2015-04-27 SURGERY — BRONCHOSCOPY, WITH FLUOROSCOPY
Anesthesia: Moderate Sedation | Laterality: Bilateral

## 2015-04-27 MED ORDER — PHENYLEPHRINE HCL 0.25 % NA SOLN
NASAL | Status: DC | PRN
  Administered 2015-04-27: 2 via NASAL

## 2015-04-27 MED ORDER — LIDOCAINE HCL (PF) 1 % IJ SOLN
INTRAMUSCULAR | Status: DC | PRN
  Administered 2015-04-27: 6 mL

## 2015-04-27 MED ORDER — IOHEXOL 300 MG/ML  SOLN
100.0000 mL | Freq: Once | INTRAMUSCULAR | Status: AC | PRN
  Administered 2015-04-27: 100 mL via INTRAVENOUS

## 2015-04-27 MED ORDER — LIDOCAINE HCL 2 % EX GEL
CUTANEOUS | Status: DC | PRN
  Administered 2015-04-27: 1

## 2015-04-27 MED ORDER — CEFUROXIME AXETIL 500 MG PO TABS
500.0000 mg | ORAL_TABLET | Freq: Two times a day (BID) | ORAL | Status: DC
  Administered 2015-04-27 – 2015-04-28 (×3): 500 mg via ORAL
  Filled 2015-04-27 (×4): qty 1

## 2015-04-27 MED ORDER — BUTAMBEN-TETRACAINE-BENZOCAINE 2-2-14 % EX AERO
1.0000 | INHALATION_SPRAY | Freq: Once | CUTANEOUS | Status: DC
  Filled 2015-04-27: qty 20

## 2015-04-27 MED ORDER — FENTANYL CITRATE (PF) 100 MCG/2ML IJ SOLN
INTRAMUSCULAR | Status: AC
  Filled 2015-04-27: qty 4

## 2015-04-27 MED ORDER — PHENYLEPHRINE HCL 0.25 % NA SOLN
1.0000 | Freq: Four times a day (QID) | NASAL | Status: DC | PRN
  Filled 2015-04-27: qty 15

## 2015-04-27 MED ORDER — SODIUM CHLORIDE 0.9 % IV SOLN
INTRAVENOUS | Status: DC
  Administered 2015-04-27: 08:00:00 via INTRAVENOUS

## 2015-04-27 MED ORDER — MIDAZOLAM HCL 10 MG/2ML IJ SOLN
INTRAMUSCULAR | Status: DC | PRN
  Administered 2015-04-27 (×2): 1 mg via INTRAVENOUS
  Administered 2015-04-27: 2 mg via INTRAVENOUS

## 2015-04-27 MED ORDER — LIDOCAINE HCL 2 % EX GEL
1.0000 "application " | Freq: Once | CUTANEOUS | Status: DC
  Filled 2015-04-27: qty 5

## 2015-04-27 MED ORDER — IOHEXOL 300 MG/ML  SOLN
25.0000 mL | INTRAMUSCULAR | Status: AC
  Administered 2015-04-27 (×2): 25 mL via ORAL

## 2015-04-27 MED ORDER — FENTANYL CITRATE (PF) 100 MCG/2ML IJ SOLN
INTRAMUSCULAR | Status: DC | PRN
  Administered 2015-04-27 (×2): 25 ug via INTRAVENOUS
  Administered 2015-04-27: 50 ug via INTRAVENOUS

## 2015-04-27 MED ORDER — MIDAZOLAM HCL 5 MG/ML IJ SOLN
INTRAMUSCULAR | Status: AC
  Filled 2015-04-27: qty 2

## 2015-04-27 NOTE — Op Note (Signed)
PCCM Video Bronchoscopy Procedure Note  The patient was informed of the risks (including but not limited to bleeding, infection, respiratory failure, lung injury, tooth/oral injury) and benefits of the procedure and gave consent, see chart.  Indication: right upper lobe mass  Post Procedure Diagnosis: right upper lobe mass  Location: [facility]  Condition pre procedure: Stable  Medications for procedure: Versed '4mg'$  IV, Fentanyl 162mg IV  Procedure description: The bronchoscope was introduced through the nose and passed to the bilateral lungs to the level of the subsegmental bronchi throughout the tracheobronchial tree.  Airway exam revealed acanthosis of the larynx but no mass, the larynx was functioning normally.  The trachea was normal and the carina was sharp.  The left tracheobronchial tree was normal in appearance without airway lesion, secretion or edema.  Right tracheobronchial tree was normal with the exception of the right upper lobe.  The anterior segment was patent but the apical segment and the posterior segments of the right upper lobe were nearly occluded from either infiltration of the mucosa or edema.  The scope could not be passed into these airways.  A BAL was performed in the posterior segment of the right upper lobe, then brushings and biopsies were performed there, see details below.  After completion of the procedures the scope was removed.  Procedures performed:  1) BAL right upper lobe, posterior segment, 60cc sterile saline injected, 20cc cloudy return 2) Transbronchial brushings from the right upper lobe: two from the apical segment, two from the posterior segment under fluorscopic guidance 3) Endobronchial biopsies from the right upper lobe: the area of edema vs infiltration in the right upper lobe  Specimens sent:  1) BAL> culture (AFB, Fungal, Bacterial and MTB-PCR) and cytology 2) Brushing> cytology, special stains requested 3) Biopsy> routine  histology with special stains  Condition post procedure:  Stable  EBL: < 5cc  Complications: None immediate  BRoselie Awkward MD [facility] Pager: [phone]: [phone] 3pm or if no response, call [phone]

## 2015-04-27 NOTE — Progress Notes (Signed)
[facility] PROGRESS NOTE  Brian Nelson YSA:[id number] DOB: Mar 15, 1961 DOA: 04/22/2015 PCP: No primary care provider on file.  Brief interval summary Brian Nelson is a 54 y.o. male  With no past medical history presented to the emergency department with the complaints of chest pain on the right side of the chest started since yesterday. The pain is sharp in nature 7 x 10 intensity, radiated to right shoulder. Workup in the emergency department the chest x-ray was concerning about pneumothorax. Patient underwent CTA of the chest which demonstrated a large right pleural effusion concerning about empyema, large infiltrate concerning about pneumonia commented as cannot exclude malignancy. Patient has underlying severe emphysema with multiple bullae largest being 12 cm. Patient smokes 2 packs a day. Patient stated, has been having cough for the last 1 month with occasional blood-tinged sputum. Patient also stated that has been having poor appetite with some loss of weight. Patient stated does not weigh himself regularly. Concerning about the large pleural effusion concerning about empyema and infiltrate pretty much involving the entire right lung patient will need further evaluation with a thoracentesis, pulmonary consult, possible CT surgery consult. Patient denied having any fever. Patient does not have any elevated white blood cell count. Denied any shortness of breath.  Patient was hospitalized. Placed on telemetry and patient underwent thoracentesis per interventional radiology which per lights criteria was consistent with an exudate. Gram stain and culture had no growth. Cytology pending. Patient was placed empirically on IV antibiotics, nebulizer treatments, steroid taper. Pulmonary was consulted and assessed the patient and due to patient's history of incarceration, weight loss, cough, upper lobe findings there was concern for reactivation TB versus malignancy. Patient was subsequently transferred  to a negative pressure room. Sputum was ordered for AFb, QuantiFERON gold was also ordered. Patient was placed on airborne isolation.        Assessment/Plan: #1 right malignant pleural effusion/mass/infiltrate Thoracentesis cytology back today and shows non-small cell malignant cells, positive for CDX-2 bringing up the possibility of GI origin.  Had broke with biopsy today. Have consulted oncology. Discussed with Dr. Edson Snowball. Will get MRI brain and CT abdomen and pelvis. Discontinue telemetry. Change to by mouth antibiotics. TB studies pending.  HIV negative. Alpha-1 antitrypsin normal.  #2 mild COPD exacerbation/bullous emphysema Exacerbation resolved Resolved  tobacco abuse Tobacco cessation. Nicotine patch.   Alcohol use Stable. No signs of withdrawal. Continue thiamine and folic acid.  moderate malnutrition  Continue on nutritional supplements.  prophylaxis SCDs for DVT prophylaxis.  Code Status: Full Family Communication: Updated patient. No family at bedside. Disposition Plan: Not stable for discharge.   Consultants:  Pulmonary: Dr. [place] Bells 04/25/2015  Procedures:  Chest x-ray 04/22/2015, 04/25/2015  CT chest 04/22/2015  Ultrasound-guided diagnostic and therapeutic right thoracentesis 04/23/2015 the interventional radiology Dr. Kathlene Cote.  Antibiotics:  IV Rocephin 04/22/2015  IV azithromycin 04/22/2015  HPI/Subjective: Wants to go home. No dyspnea.  Objective: Filed Vitals:   04/27/15 1300  BP: 113/54  Pulse: 99  Temp: 98.8 F (37.1 C)  Resp: 20    Intake/Output Summary (Last 24 hours) at 04/27/15 1453 Last data filed at 04/27/15 1300  Gross per 24 hour  Intake    240 ml  Output      0 ml  Net    240 ml   Filed Weights   04/22/15 1936 04/27/15 0810  Weight: 68.04 kg (150 lb) 68.04 kg (150 lb)    Exam:   General:  Initially calm. Periods of agitation. Thin  Cardiovascular: RRR  Respiratory: Decreased breath sounds on the right upper  lobe greater than left. No wheezing.  Abdomen: Soft, nontender, nondistended, positive bowel sounds.  Musculoskeletal: No edema. Clubbing noted on fingers.  Data Reviewed: Basic Metabolic Panel:  Recent Labs Lab 04/23/15 0421 04/24/15 0330 04/25/15 0421 04/26/15 0552 04/27/15 0715  NA 136 137 137 136 133*  K 3.8 4.1 4.3 3.9 4.2  CL 104 102 100* 98* 95*  CO2 '24 24 28 30 27  '$ GLUCOSE 135* 149* 124* 87 89  BUN '7 9 13 15 14  '$ CREATININE 0.88 0.75 0.82 0.82 0.94  CALCIUM 8.5* 9.4 9.2 9.3 9.0  MG  --  2.0  --  2.0  --    Liver Function Tests:  Recent Labs Lab 04/22/15 1953  AST 16  ALT 11*  ALKPHOS 87  BILITOT 0.2*  PROT 6.6  ALBUMIN 2.7*   No results for input(s): LIPASE, AMYLASE in the last 168 hours. No results for input(s): AMMONIA in the last 168 hours. CBC:  Recent Labs Lab 04/23/15 0421 04/24/15 0330 04/25/15 0421 04/26/15 0552 04/27/15 0715  WBC 10.1 22.6* 22.9* 17.7* 13.6*  NEUTROABS  --   --  21.3* 13.2* 8.4*  HGB 11.5* 11.3* 10.7* 11.5* 12.0*  HCT 34.2* 33.9* 32.6* 34.4* 35.2*  MCV 87.9 87.6 86.0 87.5 85.2  PLT 646* 707* 718* 694* 681*   Cardiac Enzymes:  Recent Labs Lab 04/22/15 1953  TROPONINI <0.03   BNP (last 3 results) No results for input(s): BNP in the last 8760 hours.  ProBNP (last 3 results) No results for input(s): PROBNP in the last 8760 hours.  CBG: No results for input(s): GLUCAP in the last 168 hours.  Recent Results (from the past 240 hour(s))  Culture, body fluid-bottle     Status: None (Preliminary result)   Collection Time: 04/23/15 11:18 AM  Result Value Ref Range Status   Specimen Description FLUID PLEURAL RIGHT  Final   Special Requests NONE  Final   Culture NO GROWTH 4 DAYS  Final   Report Status PENDING  Incomplete  Gram stain     Status: None   Collection Time: 04/23/15 11:18 AM  Result Value Ref Range Status   Specimen Description FLUID PLEURAL RIGHT  Final   Special Requests NONE  Final   Gram Stain    Final    FEW WBC PRESENT, PREDOMINANTLY MONONUCLEAR NO ORGANISMS SEEN    Report Status 04/23/2015 FINAL  Final     Studies: Dg Chest Port 1 View  04/27/2015  CLINICAL DATA:  Status post bronchoscopy with biopsy EXAM: PORTABLE CHEST 1 VIEW COMPARISON:  April 25, 2015. FINDINGS: The heart size and mediastinal contours are within normal limits. No pneumothorax is noted. Mild right pleural effusion is noted. 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 Stable diffuse opacification of right upper lobe is noted. The visualized skeletal structures are unremarkable. IMPRESSION: Stable right upper lobe opacity is noted concerning for pneumonia. Mild right pleural effusion is noted. Electronically Signed   By: Marijo Conception, M.D.   On: 04/27/2015 10:36   Dg C-arm Bronchoscopy  04/27/2015  CLINICAL DATA:  C-ARM BRONCHOSCOPY Fluoroscopy was utilized by the requesting physician.  No radiographic interpretation.    Scheduled Meds: . azithromycin  500 mg Intravenous Q24H  . butamben-tetracaine-benzocaine  1 spray Topical Once  . cefTRIAXone (ROCEPHIN)  IV  1 g Intravenous Q24H  . docusate sodium  100 mg Oral BID  .  feeding supplement (ENSURE ENLIVE)  237 mL Oral BID BM  . folic acid  1 mg Oral Daily  . lidocaine  1 application Topical Once  . nicotine  21 mg Transdermal Daily  . thiamine  100 mg Oral Daily   Continuous Infusions: . sodium chloride Stopped (04/27/15 0958)    Principal Problem:   Pleural effusion on right Active Problems:   Malnutrition of moderate degree   COPD exacerbation (HCC)   CAP (community acquired pneumonia)   Tobacco use disorder   Alcohol dependence ([place])   Leukocytosis   S/P thoracentesis   Cavitating mass in right upper lung lobe    Time spent: 35 minutes    Skanda Worlds L M.D. [facility] [email or web address], password [facility] 04/27/2015, 2:53 PM  LOS: 5 days

## 2015-04-27 NOTE — Progress Notes (Signed)
[facility]  Pre-sedation evaluation  Exam as I have documented this morning He feels well Lungs clear Labs, vitals normal  Moderate sedation planned Respiratory complications possible  Roselie Awkward, MD [place] [facility] Pager: [phone] Cell: [phone] After 3pm or if no response, call [phone]

## 2015-04-27 NOTE — H&P (Signed)
[facility] H&P for procedure  HPI: Mr. Heitzenrater is a 54 y/o male smoker, ex prisoner with a right upper lobe mass and effusion.  He is here for a bronchoscopy.  See my notes from this week.  Past Medical History  Diagnosis Date  . Tobacco use disorder 04/23/2015     Family History  Problem Relation Age of Onset  . Diabetes Mother      Social History   Social History  . Marital Status: Married    Spouse Name: N/A  . Number of Children: N/A  . Years of Education: N/A   Occupational History  . Not on file.   Social History Main Topics  . Smoking status: Current Every Day Smoker -- 2.00 packs/day    Types: Cigarettes  . Smokeless tobacco: Never Used  . Alcohol Use: Yes  . Drug Use: Not on file  . Sexual Activity: Not on file   Other Topics Concern  . Not on file   Social History Narrative     No Known Allergies   '@encmedstart'$ @  Filed Vitals:   04/27/15 0845 04/27/15 0850 04/27/15 0855 04/27/15 0900  BP: 116/72 126/70 132/107 124/70  Pulse: 66 69 72 66  Temp:      TempSrc:      Resp: '16 16 15 16  '$ Height:      Weight:      SpO2: 98% 100% 98% 98%   Gen: well appearing HENT: OP clear, neck supple PULM: CTA B, normal percussion CV: RRR, no mgr, trace edema GI: BS+, soft, nontender Derm: no cyanosis or rash Psyche: normal mood and affect  CBC    Component Value Date/Time   WBC 13.6* 04/27/2015 0715   RBC 4.13* 04/27/2015 0715   HGB 12.0* 04/27/2015 0715   HCT 35.2* 04/27/2015 0715   PLT 681* 04/27/2015 0715   MCV 85.2 04/27/2015 0715   MCH 29.1 04/27/2015 0715   MCHC 34.1 04/27/2015 0715   RDW 14.7 04/27/2015 0715   LYMPHSABS 1.9 04/27/2015 0715   MONOABS 2.2* 04/27/2015 0715   EOSABS 1.0* 04/27/2015 0715   BASOSABS 0.1 04/27/2015 0715    BMET    Component Value Date/Time   NA 133* 04/27/2015 0715   K 4.2 04/27/2015 0715   CL 95* 04/27/2015 0715   CO2 27 04/27/2015 0715   GLUCOSE 89 04/27/2015 0715   BUN 14 04/27/2015 0715   CREATININE 0.94  04/27/2015 0715   CALCIUM 9.0 04/27/2015 0715   GFRNONAA >60 04/27/2015 0715   GFRAA >60 04/27/2015 0715      CT images reviewed> right upper lobe posterior segment mass, right sided effusion, large bullae left upper lobe  Impression/Plan Right upper lobe mass> TB vs malignancy vs pneumonia> plan bronchoscopy today for further evaluation  Roselie Awkward, MD [place] [facility] Pager: [phone] Cell: [phone] After 3pm or if no response, call [phone]

## 2015-04-27 NOTE — Progress Notes (Signed)
Video bronchoscopy performed.  Intervention bronchial washing. Intervention bronchial brushing. Intervention bronchial biopsy.  No complications noted.  Will continue to monitor. 

## 2015-04-27 NOTE — Progress Notes (Signed)
Oncology Short Note  Oncology consult received from Dr Conley Canal. 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 Lung v/s GI source though lung primary more likely. Malignant pleural effusion would metastatic disease. Unindentifiable characteristics of primary tumor at this time. Had bronchoscopy today. Awaiting pathology from bronchoscopy.  Plan -will official see patient tomorrow morning -CT abd w contrast and MRI brain w contrast ordered for completion of staging -CEA tumor marker. -if lung NSCLC would need EGFR/ALK gene rearrangement tumor markers. -will continue to follow  Sullivan Lone MD [place] AAHIVMS [facility] Hematology/Oncology Physician [place]  (Office):       [phone] (Work cell):  [phone] (Fax):           [phone]

## 2015-04-28 ENCOUNTER — Encounter (HOSPITAL_COMMUNITY): Payer: Self-pay | Admitting: Pulmonary Disease

## 2015-04-28 ENCOUNTER — Inpatient Hospital Stay (HOSPITAL_COMMUNITY): Payer: Medicaid Other

## 2015-04-28 DIAGNOSIS — C801 Malignant (primary) neoplasm, unspecified: Secondary | ICD-10-CM

## 2015-04-28 LAB — CULTURE, BODY FLUID-BOTTLE: CULTURE: NO GROWTH

## 2015-04-28 LAB — MISC LABCORP TEST (SEND OUT): Labcorp test code: 183764

## 2015-04-28 LAB — CEA: CEA: 96.5 ng/mL — ABNORMAL HIGH (ref 0.0–4.7)

## 2015-04-28 LAB — CULTURE, BODY FLUID W GRAM STAIN -BOTTLE

## 2015-04-28 NOTE — Progress Notes (Signed)
CM received call from RN caring for patient. CM advised that the discharge order and disposition needed to come from the discharging MD but that it needed to be noted in the chart that the TB was negative and that isolation could be discontinued so that the patient can be safely discharged into the community. CM remains available for further discharge planning needs.

## 2015-04-28 NOTE — Progress Notes (Signed)
[facility]  Came by to see patient, he was gone for a procedure Chart reviewed> pleural fluid with non-small cell carcinoma quantiferon gold test re-assuring but did not have expected positive control response  From my perspective this is all likely just lung cancer, but certainly there are many people who will have both TB and lung cancer, so it is important to follow up the MTB-PCR sent from the BAL yesterday.  If negative then would remove from respiratory isolation.  Call us back if we are needed.  Roselie Awkward, MD [place] [facility] Pager: [phone] Cell: [phone] After 3pm or if no response, call [phone]

## 2015-04-28 NOTE — Progress Notes (Signed)
Oncologist saw patient, attending provider Dr. Conley Canal made aware.  New orders for discharge present.  Case management consulted.  Dr. Conley Canal made aware that discharge summary needed to be complete, Negative TB confirmation needed and Airborne Isolation discontinuation needed prior to discharge.  Will pass in report to oncoming RN, patient has contacted discharge transportation.

## 2015-04-28 NOTE — Progress Notes (Signed)
Received call back from Dr. Conley Canal. Received verbal confirmation that isolation precautions would be d/c'd and that the d/c summary would include a negative TB result. Case Management consulted and aware. AC made aware. Pt will now be discharged at this time.

## 2015-04-28 NOTE — Progress Notes (Signed)
Patient discharge teaching given, including activity, diet, follow-up appoints, and medications. Patient verbalized understanding of all discharge instructions. IV access was d/c'd. Vitals are stable. Skin is intact except as charted in most recent assessments. Pt to be escorted out by NT, to be driven home by family. 

## 2015-04-28 NOTE — Consult Note (Signed)
Marland Kitchen    HEMATOLOGY/ONCOLOGY CONSULTATION NOTE  Date of Service: 04/28/2015  Dr Kara Dies  CHIEF COMPLAINTS/PURPOSE OF CONSULTATION:   Newly diagnosed malignant pleural effusion likely due to non-small cell lung cancer  HISTORY OF PRESENTING ILLNESS:  Brian Nelson is a  54 y.o. male African-American gentleman who has been referred to [place] by Dr Annia Friendly for evaluation and management of newly diagnosed malignant pleural effusion likely due to underlying primary lung non-small cell lung cancer.  Patient  hahas a history of heavy smoking with no known past medical history and presented to the emergency department complaining of right-sided chest pain and increasing shortness of breath and dyspnea on exertion on 04/22/2015.  he also noted one-month history of occasionally but in sputum, poor appetite and about 20 pound weight loss . He underwent a CT of the chest which showed a right-sided large pleural effusion with concern for empyema. He had a therapeutic drainage of the right-sided effusion which on cytology showed MOC 31,CDX2 positive, chromogranin, Naphasin, calreticulin,TTF-1 negative malignant cells. His CT also showed severe emphysema with large emphysematous bullae. Patient was treated with empiric antibiotics IV, nebulizer treatments and steroid taper. Patient was also placed on MRI isolation for concern of possible AFB due to upper lobe infiltrates, previous history of incarceration and his symptoms. He underwent a bronchoscopy the right upper lobe was nearly occluded with infiltration of the mucosa or edema. A BAL was performed from the right upper lobe along with brushings and biopsy which also showed malignant cells which were morphologically thought to appear like adenocarcinoma. Patient ruled out for TB. He had an MRI of the brain that showed no evidence of metastatic disease. Had a CT of the abdomen did not show any overt abdominal disease.  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 His pleural effusion immunophenotype was not very typical of non-small cell lung cancer and could not rule out the possibility of an upper GI cancer. His CEA levels were elevated At 96.5 which could be from lung cancer or colorectal source. His last colonoscopy was 7 years ago .  Patient notes that he lives in [place] and would like to follow up for his cancer clear cares close to home . I recommended he consult with Dr. Ancil Linsey hospital for continued care if she has availability.  Patient notes that he is feeling much better after thoracentesis.   MEDICAL HISTORY:  Past Medical History  Diagnosis Date  . Tobacco use disorder 04/23/2015    SURGICAL HISTORY: Past Surgical History  Procedure Laterality Date  . Video bronchoscopy Bilateral 04/27/2015    Procedure: VIDEO BRONCHOSCOPY WITH FLUORO;  Surgeon: Juanito Doom, MD;  Location: [facility];  Service: Cardiopulmonary;  Laterality: Bilateral;    SOCIAL HISTORY: Social History   Social History  . Marital Status: Married    Spouse Name: N/A  . Number of Children: N/A  . Years of Education: N/A   Occupational History  . Not on file.   Social History Main Topics  . Smoking status: Current Every Day Smoker -- 2.00 packs/day    Types: Cigarettes  . Smokeless tobacco: Never Used  . Alcohol Use: Yes  . Drug Use: Not on file  . Sexual Activity: Not on file   Other Topics Concern  . Not on file   Social History Narrative    FAMILY HISTORY: Family History  Problem Relation Age of Onset  . Diabetes Mother  ALLERGIES:  has No Known Allergies.  MEDICATIONS:  Current Facility-Administered Medications  Medication Dose Route Frequency Provider Last Rate Last Dose  . butamben-tetracaine-benzocaine (CETACAINE) spray 1 spray  1 spray Topical Once Juanito Doom, MD      . cefUROXime (CEFTIN) tablet 500 mg  500  mg Oral BID WC Delfina Redwood, MD   500 mg at 04/28/15 0852  . docusate sodium (COLACE) capsule 100 mg  100 mg Oral BID Monica Becton, MD   100 mg at 04/28/15 1100  . feeding supplement (ENSURE ENLIVE) (ENSURE ENLIVE) liquid 237 mL  237 mL Oral BID BM Etta Quill, DO   237 mL at 04/28/15 1100  . folic acid (FOLVITE) tablet 1 mg  1 mg Oral Daily Eugenie Filler, MD   1 mg at 04/28/15 1101  . HYDROcodone-acetaminophen (NORCO/VICODIN) 5-325 MG per tablet 1 tablet  1 tablet Oral Q4H PRN Dianne Dun, NP   1 tablet at 04/28/15 1255  . ipratropium-albuterol (DUONEB) 0.5-2.5 (3) MG/3ML nebulizer solution 3 mL  3 mL Nebulization Q6H PRN Donita Brooks, NP      . lidocaine (XYLOCAINE) 2 % jelly 1 application  1 application Topical Once Juanito Doom, MD      . nicotine (NICODERM CQ - dosed in mg/24 hours) patch 21 mg  21 mg Transdermal Daily Padmaja Vasireddy, MD   21 mg at 04/24/15 1007  . ondansetron (ZOFRAN) injection 4 mg  4 mg Intravenous Q6H PRN Gardiner Barefoot, NP      . phenylephrine (NEO-SYNEPHRINE) 0.25 % nasal spray 1 spray  1 spray Each Nare Q6H PRN Juanito Doom, MD      . thiamine (VITAMIN B-1) tablet 100 mg  100 mg Oral Daily Padmaja Vasireddy, MD   100 mg at 04/28/15 1101    REVIEW OF SYSTEMS:    10 Point review of Systems was done is negative except as noted above.  PHYSICAL EXAMINATION: ECOG PERFORMANCE STATUS: 1 - Symptomatic but completely ambulatory  .'@VITALS2'$ @ Filed Weights   04/22/15 1936 04/27/15 0810  Weight: 150 lb (68.04 kg) 150 lb (68.04 kg)   .Body mass index is 22.81 kg/(m^2).  GENERAL: African-American gentleman in no acute distress  SKIN: no acute rashes EYES:  Anicteric but muddy sclera OROPHARYNX:  some evidence of submucous fibrosis vs leukoplakia, melanoplakia. NECK: supple, no JVD, thyroid normal size, non-tender, without nodularity LYMPH:  no palpable lymphadenopathy in the cervical, axillary or inguinal LUNGS:  decreased air entry bilaterally, more on the right lung base  HEART: regular rate & rhythm,  no murmurs and no lower extremity edema ABDOMEN: abdomen soft, non-tender, normoactive bowel sounds  Musculoskeletal:  Clubbing noted PSYCH:  Anxious to leave the hospital  NEURO:Alert and oriented 3 with somewhat limited comprehension , no focal motor/sensory deficits  LABORATORY DATA:  I have reviewed the data as listed  . CBC Latest Ref Rng 04/27/2015 04/26/2015 04/25/2015  WBC 4.0 - 10.5 K/uL 13.6(H) 17.7(H) 22.9(H)  Hemoglobin 13.0 - 17.0 g/dL 12.0(L) 11.5(L) 10.7(L)  Hematocrit 39.0 - 52.0 % 35.2(L) 34.4(L) 32.6(L)  Platelets 150 - 400 K/uL 681(H) 694(H) 718(H)    . CMP Latest Ref Rng 04/27/2015 04/26/2015 04/25/2015  Glucose 65 - 99 mg/dL 89 87 124(H)  BUN 6 - 20 mg/dL '14 15 13  '$ Creatinine 0.61 - 1.24 mg/dL 0.94 0.82 0.82  Sodium 135 - 145 mmol/L 133(L) 136 137  Potassium 3.5 - 5.1 mmol/L 4.2 3.9 4.3  Chloride 101 -  111 mmol/L 95(L) 98(L) 100(L)  CO2 22 - 32 mmol/L $RemoveB'27 30 28  'MvBMrrJT$ Calcium 8.9 - 10.3 mg/dL 9.0 9.3 9.2  Total Protein 6.5 - 8.1 g/dL - - -  Total Bilirubin 0.3 - 1.2 mg/dL - - -  Alkaline Phos 38 - 126 U/L - - -  AST 15 - 41 U/L - - -  ALT 17 - 63 U/L - - -       Comment Morphologically adenocarcinoma is favored. There is no material for ancillary testing.   RADIOGRAPHIC STUDIES: I have personally reviewed the radiological images as listed and agreed with the findings in the report. Dg Chest 1 View  04/23/2015  CLINICAL DATA:  Right thoracentesis. EXAM: CHEST 1 VIEW COMPARISON:  04/22/2015 FINDINGS: Much less pleural density in the right lower chest compared to the yesterday study. Right upper lobe infiltrate with opacified bullous disease at the right apex appears unchanged. Severe emphysema again noted on the left with increased interstitial markings. No pneumothorax post procedure. IMPRESSION: No complication following right paracentesis. Much less pleural density  at the right lung base. Persistent right upper lobe pneumonia with opacified bullous disease at the apex. Electronically Signed   By: Nelson Chimes M.D.   On: 04/23/2015 11:23   Dg Chest 2 View  04/25/2015  CLINICAL DATA:  Pleural effusion EXAM: CHEST  2 VIEW COMPARISON:  04/23/2015 FINDINGS: Dense consolidation again noted in the right upper lobe. Small right pleural effusion. Underlying COPD with bullous changes in the apices. No confluent opacity on the left. Heart is normal size. IMPRESSION: Small right pleural effusion. Dense consolidation persists in the right upper lobe. Severe COPD. Electronically Signed   By: Rolm Baptise M.D.   On: 04/25/2015 11:26   Dg Chest 2 View  04/22/2015  CLINICAL DATA:  Right-sided chest pain and shortness of breath since yesterday. EXAM: CHEST  2 VIEW COMPARISON:  None. FINDINGS: Cardiomediastinal silhouette is normal. Mediastinal contours appear intact. The lungs are hyperinflated, and there is evidence of emphysematous changes with upper lobe predominance. There is a lucency within the left lung apex, which may represent a pneumothorax versus bullous changes. There is a small left pleural effusion. There is a large right pleural effusion. There is near complete opacification of the right upper lobe of the lung. Osseous structures are without acute abnormality. Soft tissues are grossly normal. IMPRESSION: Lung emphysema. Left apical pneumothorax versus bullous changes of the lung parenchyma. Small left pleural effusion. Large right pleural effusion. Near complete opacification of the right upper lobe of the lung. This may represent infectious consolidation, however endobronchial lesion causing postobstructive pneumonia cannot be excluded. CT of the chest may be considered. Electronically Signed   By: Fidela Salisbury M.D.   On: 04/22/2015 21:11   Ct Chest W Contrast  04/22/2015  CLINICAL DATA:  Intermittent right-sided chest pain and shortness of breath beginning  yesterday. History of emphysema. EXAM: CT CHEST WITH CONTRAST TECHNIQUE: Multidetector CT imaging of the chest was performed during intravenous contrast administration. CONTRAST:  175mL OMNIPAQUE IOHEXOL 300 MG/ML  SOLN COMPARISON:  Radiography same day FINDINGS: There is a background pattern of advanced emphysema I with extensive bullous changes in the upper lobes. On the left, there is a dominant bulla measuring 12 cm in diameter. In the left lung, there are a few foci of nodular density measuring up to 1 cm in size which will require follow-up after treatment of this acute episode. Some of these could meet criteria for suspicion of  malignancy. On the right, there is a large effusion primarily loculated at the base worrisome for empyema. There is considerable volume loss in the right lower lung because of that. There is consolidation in the right upper lobe with fluid filling Bas lie at the apex consistent with advanced right upper lobe pneumonia. Certainly, a mass lesion could be hidden and this area will will need to be evaluated after treatment. There are slightly prominent lymph nodes in the mediastinum that are likely reactive to the inflammatory process. There is no pneumothorax. Scans in the upper abdomen do not show any abnormality. IMPRESSION: Advanced right upper lobe pneumonia. Extensive empyema at the right lung base. Compressive atelectasis of the right lung. Background pattern of centrilobular emphysema with a 12 cm bulla at the left lung apex. Areas of abnormal density in the left lung which will require follow-up after treatment of this acute inflammatory process. Some of these made meet criteria for suspicion for lung cancer. Electronically Signed   By: Nelson Chimes M.D.   On: 04/22/2015 22:08   Mr Jeri Cos GY Contrast  04/28/2015  CLINICAL DATA:  Malignant effusion. Cytology positive for non-small cell carcinoma. EXAM: MRI HEAD WITHOUT AND WITH CONTRAST TECHNIQUE: Multiplanar, multiecho pulse  sequences of the brain and surrounding structures were obtained without and with intravenous contrast. CONTRAST:  14 mL MultiHance COMPARISON:  None. FINDINGS: No acute infarct, hemorrhage, or mass lesion is present. The ventricles are of normal size. No significant extraaxial fluid collection is present. The postcontrast images demonstrate no pathologic enhancement to suggest metastatic disease the brain or meninges. No significant white matter disease is present. The internal auditory canals are within normal limits. The brainstem is within normal limits. Flow is present in the major intracranial arteries. The globes and orbits are within normal limits. The paranasal sinuses are clear. There is some fluid in the left mastoid air cells. No obstructing nasopharyngeal lesion is present. The skullbase is within normal limits. Midline structures demonstrate degenerative change in the upper cervical spine. Intracranial midline structures are within normal limits. IMPRESSION: 1. Normal MRI appearance of the brain. No evidence for metastatic disease to the brain or meninges. 2. Degenerative changes in the upper cervical spine without definite osseous metastases. Electronically Signed   By: San Morelle M.D.   On: 04/28/2015 12:10   Ct Abdomen Pelvis W Contrast  04/27/2015  CLINICAL DATA:  Malignant pleural effusions suggesting GI origin. No abdominal complaints. EXAM: CT ABDOMEN AND PELVIS WITH CONTRAST TECHNIQUE: Multidetector CT imaging of the abdomen and pelvis was performed using the standard protocol following bolus administration of intravenous contrast. CONTRAST:  180mL OMNIPAQUE IOHEXOL 300 MG/ML  SOLN COMPARISON:  Ultrasound abdomen 04/23/2015 FINDINGS: Moderate size right pleural effusion with probable loculation. Mild nodular pleural thickening. This may represent malignant effusion. Atelectasis or consolidation in the right lung base. Irregular nodule in the left lung base anteriorly measuring 11  mm diameter. Additional ground-glass nodules demonstrated in the left lung base. Metastasis is suspected. Emphysematous changes in the lungs. Scattered calcified granulomas in the liver. No focal liver lesions otherwise demonstrated. Gallbladder is contracted, likely physiologic. The pancreas, spleen, kidneys, and inferior vena cava are unremarkable. Calcification of abdominal aorta without aneurysm. No definite retroperitoneal lymphadenopathy. Decreased abdominal fat limits evaluation of the retroperitoneum. Stomach and small bowel are decompressed. Contrast material is present in the colon suggesting no evidence of bowel obstruction. Diffusely stool-filled colon. No free air or free fluid demonstrated in the abdomen. Pelvis: Calcification in the prostate  gland. Mild thickening of the bladder wall may indicate cystitis or hypertrophy due to outlet obstruction. No free or loculated pelvic fluid collections. Appendix not identified. Rectosigmoid colon is mostly decompressed. There is a bone sclerosis at multiple endplates likely representing discogenic change. No definite destructive bone lesions. Degenerative changes in the lumbar spine. IMPRESSION: Moderate right pleural effusion with loculation and pleural nodularity suggesting malignant effusion. Atelectasis in the right lung base. Ground-glass nodules in the left lung base, largest 1.1 cm. This is likely metastatic although inflammatory nodularity not entirely excluded. No definite evidence of malignancy demonstrated in the abdomen or pelvis although technical factors limits evaluation of bowel and colon. Bladder wall thickening may be due to cystitis or outlet obstruction. Electronically Signed   By: Lucienne Capers M.D.   On: 04/27/2015 23:05   Dg Chest Port 1 View  04/27/2015  CLINICAL DATA:  Status post bronchoscopy with biopsy EXAM: PORTABLE CHEST 1 VIEW COMPARISON:  April 25, 2015. FINDINGS: The heart size and mediastinal contours are within normal  limits. No pneumothorax is noted. Mild right pleural effusion is noted. 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 Stable diffuse opacification of right upper lobe is noted. The visualized skeletal structures are unremarkable. IMPRESSION: Stable right upper lobe opacity is noted concerning for pneumonia. Mild right pleural effusion is noted. Electronically Signed   By: Marijo Conception, M.D.   On: 04/27/2015 10:36   US Thoracentesis Asp Pleural Space W/img Guide  04/23/2015  INDICATION: Pneumonia, smoker, cough, dyspnea, chest pain, right pleural effusion. Request is made for diagnostic and therapeutic right thoracentesis. EXAM: ULTRASOUND GUIDED DIAGNOSTIC AND THERAPEUTIC RIGHT THORACENTESIS COMPARISON:  None. MEDICATIONS: None COMPLICATIONS: None immediate TECHNIQUE: Informed written consent was obtained from the patient after a discussion of the risks, benefits and alternatives to treatment. A timeout was performed prior to the initiation of the procedure. Initial ultrasound scanning demonstrates a moderate to large right pleural effusion. The lower chest was prepped and draped in the usual sterile fashion. 1% lidocaine was used for local anesthesia. An ultrasound image was saved for documentation purposes. A 6 Fr Safe-T-Centesis catheter was introduced. The thoracentesis was performed. The catheter was removed and a dressing was applied. The patient tolerated the procedure well without immediate post procedural complication. The patient was escorted to have an upright chest radiograph. FINDINGS: A total of approximately 1.4 liters of turbid, amber fluid was removed. Requested samples were sent to the laboratory. IMPRESSION: Successful ultrasound-guided diagnostic and therapeutic right sided thoracentesis yielding 1.4 liters of pleural fluid. Read by: Rowe Robert, PA-C Electronically Signed   By: Aletta Edouard M.D.   On: 04/23/2015 11:06   Dg C-arm  Bronchoscopy  04/27/2015  CLINICAL DATA:  C-ARM BRONCHOSCOPY Fluoroscopy was utilized by the requesting physician.  No radiographic interpretation.    ASSESSMENT & PLAN:   54 year old African-American gentleman with heavy history of smoking with  #1 Newly diagnosed malignancy presenting with malignant right pleural effusion. Likely TxNxM1a lung adenocarcinoma though the immunophenotype of the malignant cells in the pleural effusion did not rule out a gastrointestinal/colorectal malignancy.  Elevated CEA level last colonoscopy was 7 years ago .  #2 heavy smoker  #3 severe emphysema . #4  reactive thrombocytosis likely related to his malignancy . Plan  -Patient would likely need a colonoscopy. -A GI workup negative it will confirm the presence of a primary lung cancer which clinically and radiographically appears to be the case . -Will  discuss with pathology to determine if we have enough tissue for EGFR mutation/ALK gene rearrangement and ROS-1 testing. -If insufficient tissue might consider EGFR mutation testing on peripheral blood . -Smoking cessation counseling done .patient does not appear very keen to quit smoking . -Patient will need to set up a primary care physician for other cares . -Optimize COPD management as per pulmonary . -Patient lives in [place] and does not drive. He and his family are keen to get their care locally. We will refer the patient to Dr. Ancil Linsey and [facility] for further evaluation and management of his newly diagnosed malignancy. -If the patient has recurrent pleural effusion despite treatment might need to consider the possibility of pleurodesis. -Patient and family was counseled that the malignant pleural effusion represents a metastatic process and that his malignancy is incurable but could be treated with palliative intent and potentially might be life-prolonging.  All of the patients and his family's questions were answered to their  satisfaction.  I spent 60 minutes counseling the patient face to face. The total time spent in the appointment was 80 minutes and more than 50% was on counseling and direct patient cares.    Sullivan Lone MD [place] AAHIVMS [facility] Hematology/Oncology Physician [facility]  (Office):       [phone] (Work cell):  [phone] (Fax):           [phone]  04/28/2015 2:58 PM

## 2015-04-28 NOTE — Discharge Summary (Signed)
Physician Discharge Summary  Brian Brian GYJ:[id number] DOB: 09/04/1960 DOA: 04/22/2015  PCP: No primary care provider on file.  Admit date: 04/22/2015 Discharge date: 04/28/2015  Time spent: greater than 30 minutes  Recommendations for Outpatient Follow-up:  1. Outpatient colonoscopy.  Patient will call Dr. Gala Brian, on call for GI at [facility].  Patient requests discharge tonight at 6:45 pm 2. F/u Dr. 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 male  With no past medical history comes to the emergency department with the complaints of chest pain on the right side of the chest started since yesterday. The pain is sharp in nature 7 x 10 intensity, radiates to right shoulder. Workup in the emergency department the chest x-ray was concerning about pneumothorax. Patient underwent CTA of the chest which demonstrated a large right pleural effusion concerning about empyema, large infiltrate concerning about pneumonia commented as cannot exclude malignancy. Patient has underlying severe emphysema with multiple bullae largest being 12 cm. Patient smokes 2 packs a day. Patient states, has been having cough for the last 1 month with occasional blood-tinged sputum. Patient also states that has been having poor appetite with some loss of weight. Patient states does not weigh himself regularly. Concerning about the large pleural effusion concerning about empyema and infiltrate pretty much involving the entire  right lung patient will need further evaluation with a thoracentesis, pulmonary consult, possible CT surgery consult. Patient denies having any fever. Patient does not have any elevated white blood cell count. Denied any shortness of breath.  Hospital Course:  Started on rocephin azithromycin for CAP coverage.  IR consulted for thoracentesis.  Pulmonary medicine consulted. Patient placed in respiratory isolation.  Thoracentesis positive for malignant cells.  Non-small cell carcinoma "THE MALIGNANT CELLS ARE POSITIVE FOR MOC-31, CYTOKERATIN AE1/AE3, AND CDX-2. THEY ARE NEGATIVE FOR CHROMOGRANIN, NAPSIN, TTF-1, WT-1, SYNAPTOPHYSIN, CYTOKERATIN 5/6, CD56, AND CALRETININ. THIS PROFILE IS NON-SPECIFIC, BUT SUPPORTS THE DIAGNOSIS OF CARCINOMA. THE POSITIVE STAINING WITH CDX-2 RAISES THE POSSIBILITY OF A GASTROINTESTINAL PRIMARY, ALTHOUGH RADIOLOGIC AND CLINICAL CORRELATION IS NEEDED." patient had bronchoscopy. Bronchial brushings suspicious for malignancy and BAL showed malignant cells. biosy nondiagnostic, NO AFB. AFB PCR negative.   Staging CT abd/pelvis without malignancy. MRI brain negative for metastases. CEA elevated at 96.  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 Whitney Nelson in [place].  Patient demanding discharge tonight after offices are closed. Have given him phone numbers and he will call in am.  Dr. Gala Brian on call for GI, so have asked pt to call his office for colonoscopy  Procedures: Thoracentesis, right Bronchoscopy, BAL, brushing and bx  Consultations:  IR, pulmonary, oncology  Discharge Exam: Filed Vitals:   04/28/15 1536  BP: 126/63  Pulse: 99  Temp: 98.6 F (37 C)  Resp: 18    General: cooperative, a and o, comfortable Cardiovascular:  RRR Respiratory: CTA  Discharge  Instructions   Discharge Instructions    Activity as tolerated - No restrictions    Complete by:  As directed      Diet general    Complete by:  As directed           There are no discharge medications for this patient.  No Known Allergies Follow-up Information    Schedule an appointment as soon as possible for a visit with Brian Hazard, MD.   Specialties:  Hematology and Oncology, Oncology   Contact information:   [place] [address] [phone]       Schedule an appointment as soon as possible for a visit with Brian Rudd, MD.   Specialty:  Gastroenterology   Why:  TO SCHEDULE COLONOSCOPY   Contact information:   [address] [place] [address] [phone]        The results of significant diagnostics from this hospitalization (including imaging, microbiology, ancillary and laboratory) are listed below for reference.    Significant Diagnostic Studies: Dg Chest 1 View  04/23/2015  CLINICAL DATA:  Right thoracentesis. EXAM: CHEST 1 VIEW COMPARISON:  04/22/2015 FINDINGS: Much less pleural density in the right lower chest compared to the yesterday study. Right upper lobe infiltrate with opacified bullous disease at the right apex appears unchanged. Severe emphysema again noted on the left with increased interstitial markings. No pneumothorax post procedure. IMPRESSION: No complication following right paracentesis. Much less pleural density at the right lung base. Persistent right upper lobe pneumonia with opacified bullous disease at the apex. Electronically Signed   By: Brian Brian M.D.   On: 04/23/2015 11:23   Dg Chest 2 View  04/25/2015  CLINICAL DATA:  Pleural effusion EXAM: CHEST  2 VIEW COMPARISON:  04/23/2015 FINDINGS: Dense consolidation again noted in the right upper lobe. Small right pleural effusion. Underlying COPD with bullous changes in the apices. No confluent opacity on the left. Heart is normal size. IMPRESSION: Small right  pleural effusion. Dense consolidation persists in the right upper lobe. Severe COPD. Electronically Signed   By: Brian Brian M.D.   On: 04/25/2015 11:26   Dg Chest 2 View  04/22/2015  CLINICAL DATA:  Right-sided chest pain and shortness of breath since yesterday. EXAM: CHEST  2 VIEW COMPARISON:  None. FINDINGS: Cardiomediastinal silhouette is normal. Mediastinal contours appear intact. The lungs are hyperinflated, and there is evidence of emphysematous changes with upper lobe predominance. There is a lucency within the left lung apex, which may represent a pneumothorax versus bullous changes. There is a small left pleural effusion. There is a large right pleural effusion. There is near complete opacification of the right upper lobe of the lung. Osseous structures are without acute abnormality. Soft tissues are grossly normal. IMPRESSION: Lung emphysema. Left apical pneumothorax versus bullous changes of the lung parenchyma. Small left pleural effusion. Large right pleural effusion. Near complete opacification of the right upper lobe of the lung. This may represent infectious consolidation, however endobronchial lesion causing postobstructive pneumonia cannot be excluded. CT of the chest may be considered. Electronically Signed   By: Fidela Salisbury M.D.   On: 04/22/2015 21:11   Ct Chest W Contrast  04/22/2015  CLINICAL DATA:  Intermittent right-sided chest pain and shortness of breath beginning yesterday. History of emphysema. EXAM: CT CHEST WITH CONTRAST TECHNIQUE: Multidetector CT imaging of the chest was performed during intravenous contrast administration. CONTRAST:  188m OMNIPAQUE IOHEXOL 300 MG/ML  SOLN COMPARISON:  Radiography same day FINDINGS: There is a background pattern of advanced emphysema I with extensive bullous changes in the upper lobes. On the left, there is a dominant bulla measuring 12 cm in diameter. In the left lung, there are a few foci of nodular density measuring up to 1 cm in  size which will require follow-up after treatment of this acute episode. Some of these could meet criteria for suspicion of malignancy. On the right, there is a large effusion primarily loculated at the base worrisome for empyema. There is considerable volume loss in the right lower lung because of that. There is consolidation in the right upper lobe with fluid filling Bas lie at the apex consistent with advanced right upper lobe pneumonia. Certainly, a mass lesion could be hidden and this area will will need to be evaluated after treatment. There are slightly prominent lymph nodes in the mediastinum that are likely reactive to the inflammatory process. There is no pneumothorax. Scans in the upper abdomen do not show any abnormality. IMPRESSION: Advanced right upper lobe pneumonia. Extensive empyema at the right lung base. Compressive atelectasis of the right lung. Background pattern of centrilobular emphysema with a 12 cm bulla at the left lung apex. Areas of abnormal density in the left lung which will require follow-up after treatment of this acute inflammatory process. Some of these made meet criteria for suspicion for lung cancer. Electronically Signed   By: Brian Brian M.D.   On: 04/22/2015 22:08   Mr Jeri Cos FI Contrast  04/28/2015  CLINICAL DATA:  Malignant effusion. Cytology positive for non-small cell carcinoma. EXAM: MRI HEAD WITHOUT AND WITH CONTRAST TECHNIQUE: Multiplanar, multiecho pulse sequences of the brain and surrounding structures were obtained without and with intravenous contrast. CONTRAST:  14 mL MultiHance COMPARISON:  None. FINDINGS: No acute infarct, hemorrhage, or mass lesion is present. The ventricles are of normal size. No significant extraaxial fluid collection is present. The postcontrast images demonstrate no pathologic enhancement to suggest metastatic disease the brain or meninges. No significant white matter disease is present. The internal auditory canals are within normal  limits. The brainstem is within normal limits. Flow is present in the major intracranial arteries. The globes and orbits are within normal limits. The paranasal sinuses are clear. There is some fluid in the left mastoid air cells. No obstructing nasopharyngeal lesion is present. The skullbase is within normal limits. Midline structures demonstrate degenerative change in the upper cervical spine. Intracranial midline structures are within normal limits. IMPRESSION: 1. Normal MRI appearance of the brain. No evidence for metastatic disease to the brain or meninges. 2. Degenerative changes in the upper cervical spine without definite osseous metastases. Electronically Signed   By: San Morelle M.D.   On: 04/28/2015 12:10   Ct Abdomen Pelvis W Contrast  04/27/2015  CLINICAL DATA:  Malignant pleural effusions suggesting GI origin. No abdominal complaints. EXAM: CT ABDOMEN AND PELVIS WITH CONTRAST TECHNIQUE: Multidetector CT imaging of the abdomen and pelvis was performed using the standard protocol following bolus administration of intravenous contrast. CONTRAST:  187m OMNIPAQUE IOHEXOL 300 MG/ML  SOLN COMPARISON:  Ultrasound abdomen 04/23/2015 FINDINGS: Moderate size right pleural effusion with probable loculation. Mild nodular pleural thickening. This may represent malignant effusion. Atelectasis or consolidation in the right lung base. Irregular nodule in the left lung base anteriorly measuring 11 mm diameter. Additional ground-glass nodules demonstrated in the left lung base. Metastasis is suspected. Emphysematous changes in the lungs. Scattered calcified granulomas in the liver. No focal liver lesions  otherwise demonstrated. Gallbladder is contracted, likely physiologic. The pancreas, spleen, kidneys, and inferior vena cava are unremarkable. Calcification of abdominal aorta without aneurysm. No definite retroperitoneal lymphadenopathy. Decreased abdominal fat limits evaluation of the retroperitoneum.  Stomach and small bowel are decompressed. Contrast material is present in the colon suggesting no evidence of bowel obstruction. Diffusely stool-filled colon. No free air or free fluid demonstrated in the abdomen. Pelvis: Calcification in the prostate gland. Mild thickening of the bladder wall may indicate cystitis or hypertrophy due to outlet obstruction. No free or loculated pelvic fluid collections. Appendix not identified. Rectosigmoid colon is mostly decompressed. There is a bone sclerosis at multiple endplates likely representing discogenic change. No definite destructive bone lesions. Degenerative changes in the lumbar spine. IMPRESSION: Moderate right pleural effusion with loculation and pleural nodularity suggesting malignant effusion. Atelectasis in the right lung base. Ground-glass nodules in the left lung base, largest 1.1 cm. This is likely metastatic although inflammatory nodularity not entirely excluded. No definite evidence of malignancy demonstrated in the abdomen or pelvis although technical factors limits evaluation of bowel and colon. Bladder wall thickening may be due to cystitis or outlet obstruction. Electronically Signed   By: Lucienne Capers M.D.   On: 04/27/2015 23:05   Dg Chest Port 1 View  04/27/2015  CLINICAL DATA:  Status post bronchoscopy with biopsy EXAM: PORTABLE CHEST 1 VIEW COMPARISON:  April 25, 2015. FINDINGS: The heart size and mediastinal contours are within normal limits. No pneumothorax is noted. Mild right pleural effusion is noted. 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 Stable diffuse opacification of right upper lobe is noted. The visualized skeletal structures are unremarkable. IMPRESSION: Stable right upper lobe opacity is noted concerning for pneumonia. Mild right pleural effusion is noted. Electronically Signed   By: Marijo Conception, M.D.   On: 04/27/2015 10:36   US Thoracentesis Asp Pleural Space W/img  Guide  04/23/2015  INDICATION: Pneumonia, smoker, cough, dyspnea, chest pain, right pleural effusion. Request is made for diagnostic and therapeutic right thoracentesis. EXAM: ULTRASOUND GUIDED DIAGNOSTIC AND THERAPEUTIC RIGHT THORACENTESIS COMPARISON:  None. MEDICATIONS: None COMPLICATIONS: None immediate TECHNIQUE: Informed written consent was obtained from the patient after a discussion of the risks, benefits and alternatives to treatment. A timeout was performed prior to the initiation of the procedure. Initial ultrasound scanning demonstrates a moderate to large right pleural effusion. The lower chest was prepped and draped in the usual sterile fashion. 1% lidocaine was used for local anesthesia. An ultrasound image was saved for documentation purposes. A 6 Fr Safe-T-Centesis catheter was introduced. The thoracentesis was performed. The catheter was removed and a dressing was applied. The patient tolerated the procedure well without immediate post procedural complication. The patient was escorted to have an upright chest radiograph. FINDINGS: A total of approximately 1.4 liters of turbid, amber fluid was removed. Requested samples were sent to the laboratory. IMPRESSION: Successful ultrasound-guided diagnostic and therapeutic right sided thoracentesis yielding 1.4 liters of pleural fluid. Read by: Rowe Robert, PA-C Electronically Signed   By: Aletta Edouard M.D.   On: 04/23/2015 11:06   Dg C-arm Bronchoscopy  04/27/2015  CLINICAL DATA:  C-ARM BRONCHOSCOPY Fluoroscopy was utilized by the requesting physician.  No radiographic interpretation.    Microbiology: Recent Results (from the past 240 hour(s))  Culture, body fluid-bottle     Status: None   Collection Time: 04/23/15 11:18 AM  Result Value Ref Range Status   Specimen Description FLUID PLEURAL RIGHT  Final   Special Requests NONE  Final   Culture NO GROWTH 5 DAYS  Final   Report Status 04/28/2015 FINAL  Final  Gram stain     Status: None    Collection Time: 04/23/15 11:18 AM  Result Value Ref Range Status   Specimen Description FLUID PLEURAL RIGHT  Final   Special Requests NONE  Final   Gram Stain   Final    FEW WBC PRESENT, PREDOMINANTLY MONONUCLEAR NO ORGANISMS SEEN    Report Status 04/23/2015 FINAL  Final  Culture, respiratory (NON-Expectorated)     Status: None (Preliminary result)   Collection Time: 04/27/15  9:25 AM  Result Value Ref Range Status   Specimen Description BRONCHIAL ALVEOLAR LAVAGE  Final   Special Requests Normal  Final   Gram Stain   Final    FEW WBC PRESENT,BOTH PMN AND MONONUCLEAR NO SQUAMOUS EPITHELIAL CELLS SEEN NO ORGANISMS SEEN Performed at [facility]    Culture   Final    NO GROWTH 1 DAY Performed at [facility]    Report Status PENDING  Incomplete  Fungus Culture with Smear     Status: None (Preliminary result)   Collection Time: 04/27/15  9:25 AM  Result Value Ref Range Status   Specimen Description BRONCHIAL ALVEOLAR LAVAGE  Final   Special Requests Normal  Final   Fungal Smear   Final    NO YEAST OR FUNGAL ELEMENTS SEEN Performed at [facility]    Culture   Final    CULTURE IN PROGRESS FOR FOUR WEEKS Performed at [facility]    Report Status PENDING  Incomplete  AFB culture with smear     Status: None (Preliminary result)   Collection Time: 04/27/15  9:25 AM  Result Value Ref Range Status   Specimen Description BRONCHIAL ALVEOLAR LAVAGE  Final   Special Requests Normal  Final   Acid Fast Smear   Final    NO ACID FAST BACILLI SEEN Performed at [facility]    Culture   Final    CULTURE WILL BE EXAMINED FOR 6 WEEKS BEFORE ISSUING A FINAL REPORT Performed at [facility]    Report Status PENDING  Incomplete     Labs: Basic Metabolic Panel:  Recent Labs Lab 04/23/15 0421 04/24/15 0330 04/25/15 0421 04/26/15 0552 04/27/15 0715  NA 136 137 137 136 133*  K 3.8 4.1 4.3 3.9 4.2  CL 104 102 100* 98* 95*  CO2  '24 24 28 30 27  '$ GLUCOSE 135* 149* 124* 87 89  BUN '7 9 13 15 14  '$ CREATININE 0.88 0.75 0.82 0.82 0.94  CALCIUM 8.5* 9.4 9.2 9.3 9.0  MG  --  2.0  --  2.0  --    Liver Function Tests:  Recent Labs Lab 04/22/15 1953  AST 16  ALT 11*  ALKPHOS 87  BILITOT 0.2*  PROT 6.6  ALBUMIN 2.7*   No results for input(s): LIPASE, AMYLASE in the last 168 hours. No results for input(s): AMMONIA in the last 168 hours. CBC:  Recent Labs Lab 04/23/15 0421 04/24/15 0330 04/25/15 0421 04/26/15 0552 04/27/15 0715  WBC 10.1 22.6* 22.9* 17.7* 13.6*  NEUTROABS  --   --  21.3* 13.2* 8.4*  HGB 11.5* 11.3* 10.7* 11.5* 12.0*  HCT 34.2* 33.9* 32.6* 34.4* 35.2*  MCV 87.9 87.6 86.0 87.5 85.2  PLT 646* 707* 718* 694* 681*   Cardiac Enzymes:  Recent Labs Lab 04/22/15 1953  TROPONINI <0.03  BNP: BNP (last 3 results) No results for input(s): BNP in the last 8760 hours.  ProBNP (last 3 results) No results for input(s): PROBNP in the last 8760 hours.  CBG: No results for input(s): GLUCAP in the last 168 hours.     SignedDelfina Redwood  [facility] 04/28/2015, 6:41 PM

## 2015-04-29 ENCOUNTER — Telehealth: Payer: Self-pay | Admitting: Internal Medicine

## 2015-04-29 LAB — CULTURE, RESPIRATORY W GRAM STAIN
Culture: NO GROWTH
Special Requests: NORMAL

## 2015-04-29 LAB — CULTURE, RESPIRATORY

## 2015-04-29 NOTE — Telephone Encounter (Signed)
Harlen Labs (cousin) of patient called to say that patient had been in the hospital recently and to the ED.  She said they he needed to schedule a colonoscopy with [facility] ASAP.  This would be a new patient to [place]. I told the cousin that I would have the nurse and provider take a look at the epic records and advise what we needed to do.  I also told the cousin that it may be Monday before we could get back with her and/or patient. Please advise if we need to bring patient in for OV first as a new patient.  Lattie Haw can be reached at [phone] and patient's number is [phone]

## 2015-04-30 MED ORDER — GADOBENATE DIMEGLUMINE 529 MG/ML IV SOLN
15.0000 mL | Freq: Once | INTRAVENOUS | Status: AC | PRN
  Administered 2015-04-28: 15 mL via INTRAVENOUS

## 2015-05-02 ENCOUNTER — Other Ambulatory Visit: Payer: Self-pay | Admitting: *Deleted

## 2015-05-02 NOTE — Telephone Encounter (Signed)
Reviewed records. Patient with newly diagnosed malignancy pleural effuse likely underlying primary lung non-small cell lung cancer. CEA elevated and oncology recommending colonoscopy to rule out colon cancer. Patient will be establishing care with [name] in [place].   Discussed with Rosendo Gros. Patient has Medicaid and PCP is reported to be Dr. Legrand Rams. He will need referral.  Once referral obtained, get patient in within two weeks.

## 2015-05-03 ENCOUNTER — Emergency Department (HOSPITAL_COMMUNITY): Payer: Medicaid Other

## 2015-05-03 ENCOUNTER — Encounter (HOSPITAL_COMMUNITY): Payer: Self-pay | Admitting: Emergency Medicine

## 2015-05-03 ENCOUNTER — Inpatient Hospital Stay (HOSPITAL_COMMUNITY)
Admission: EM | Admit: 2015-05-03 | Discharge: 2015-05-07 | DRG: 181 | Disposition: A | Discharge status: Home / Self Care | Payer: Medicaid Other | Attending: Internal Medicine | Admitting: Internal Medicine

## 2015-05-03 DIAGNOSIS — Z9889 Other specified postprocedural states: Secondary | ICD-10-CM

## 2015-05-03 DIAGNOSIS — J441 Chronic obstructive pulmonary disease with (acute) exacerbation: Secondary | ICD-10-CM | POA: Diagnosis present

## 2015-05-03 DIAGNOSIS — D649 Anemia, unspecified: Secondary | ICD-10-CM | POA: Diagnosis present

## 2015-05-03 DIAGNOSIS — D696 Thrombocytopenia, unspecified: Secondary | ICD-10-CM | POA: Diagnosis present

## 2015-05-03 DIAGNOSIS — K449 Diaphragmatic hernia without obstruction or gangrene: Secondary | ICD-10-CM | POA: Diagnosis present

## 2015-05-03 DIAGNOSIS — J90 Pleural effusion, not elsewhere classified: Secondary | ICD-10-CM | POA: Diagnosis present

## 2015-05-03 DIAGNOSIS — F1910 Other psychoactive substance abuse, uncomplicated: Secondary | ICD-10-CM | POA: Diagnosis not present

## 2015-05-03 DIAGNOSIS — Z681 Body mass index (BMI) 19 or less, adult: Secondary | ICD-10-CM

## 2015-05-03 DIAGNOSIS — C801 Malignant (primary) neoplasm, unspecified: Secondary | ICD-10-CM | POA: Diagnosis present

## 2015-05-03 DIAGNOSIS — Z833 Family history of diabetes mellitus: Secondary | ICD-10-CM

## 2015-05-03 DIAGNOSIS — R0600 Dyspnea, unspecified: Secondary | ICD-10-CM

## 2015-05-03 DIAGNOSIS — J910 Malignant pleural effusion: Secondary | ICD-10-CM | POA: Diagnosis present

## 2015-05-03 DIAGNOSIS — F1210 Cannabis abuse, uncomplicated: Secondary | ICD-10-CM | POA: Diagnosis present

## 2015-05-03 DIAGNOSIS — F1010 Alcohol abuse, uncomplicated: Secondary | ICD-10-CM | POA: Diagnosis present

## 2015-05-03 DIAGNOSIS — E440 Moderate protein-calorie malnutrition: Secondary | ICD-10-CM | POA: Diagnosis present

## 2015-05-03 DIAGNOSIS — F17210 Nicotine dependence, cigarettes, uncomplicated: Secondary | ICD-10-CM | POA: Diagnosis present

## 2015-05-03 DIAGNOSIS — C3491 Malignant neoplasm of unspecified part of right bronchus or lung: Principal | ICD-10-CM | POA: Diagnosis present

## 2015-05-03 DIAGNOSIS — F1410 Cocaine abuse, uncomplicated: Secondary | ICD-10-CM | POA: Diagnosis present

## 2015-05-03 DIAGNOSIS — R0602 Shortness of breath: Secondary | ICD-10-CM | POA: Diagnosis present

## 2015-05-03 DIAGNOSIS — F17200 Nicotine dependence, unspecified, uncomplicated: Secondary | ICD-10-CM | POA: Diagnosis present

## 2015-05-03 HISTORY — DX: Alcohol abuse, uncomplicated: F10.10

## 2015-05-03 HISTORY — DX: Other psychoactive substance use, unspecified, uncomplicated: F19.90

## 2015-05-03 LAB — COMPREHENSIVE METABOLIC PANEL
ALT: 32 U/L (ref 17–63)
ANION GAP: 8 (ref 5–15)
AST: 17 U/L (ref 15–41)
Albumin: 2.5 g/dL — ABNORMAL LOW (ref 3.5–5.0)
Alkaline Phosphatase: 104 U/L (ref 38–126)
BUN: 12 mg/dL (ref 6–20)
CHLORIDE: 98 mmol/L — AB (ref 101–111)
CO2: 28 mmol/L (ref 22–32)
Calcium: 8.9 mg/dL (ref 8.9–10.3)
Creatinine, Ser: 0.81 mg/dL (ref 0.61–1.24)
GFR calc Af Amer: >60 mL/min (ref >60)
Glucose, Bld: 93 mg/dL (ref 65–99)
POTASSIUM: 4.4 mmol/L (ref 3.5–5.1)
Sodium: 134 mmol/L — ABNORMAL LOW (ref 135–145)
Total Bilirubin: 0.2 mg/dL — ABNORMAL LOW (ref 0.3–1.2)
Total Protein: 6.7 g/dL (ref 6.5–8.1)

## 2015-05-03 LAB — CBC WITH DIFFERENTIAL/PLATELET
BASOS ABS: 0.1 10*3/uL (ref 0.0–0.1)
BASOS PCT: 1 %
EOS PCT: 6 %
Eosinophils Absolute: 0.8 10*3/uL — ABNORMAL HIGH (ref 0.0–0.7)
HCT: 34.7 % — ABNORMAL LOW (ref 39.0–52.0)
Hemoglobin: 11.6 g/dL — ABNORMAL LOW (ref 13.0–17.0)
LYMPHS PCT: 14 %
Lymphs Abs: 2 10*3/uL (ref 0.7–4.0)
MCH: 29.1 pg (ref 26.0–34.0)
MCHC: 33.4 g/dL (ref 30.0–36.0)
MCV: 87 fL (ref 78.0–100.0)
MONO ABS: 1.5 10*3/uL — AB (ref 0.1–1.0)
Monocytes Relative: 10 %
Neutro Abs: 9.8 10*3/uL — ABNORMAL HIGH (ref 1.7–7.7)
Neutrophils Relative %: 69 %
PLATELETS: 726 10*3/uL — AB (ref 150–400)
RBC: 3.99 MIL/uL — ABNORMAL LOW (ref 4.22–5.81)
RDW: 14.2 % (ref 11.5–15.5)
WBC: 14.2 10*3/uL — ABNORMAL HIGH (ref 4.0–10.5)

## 2015-05-03 MED ORDER — DEXTROSE-NACL 5-0.9 % IV SOLN
INTRAVENOUS | Status: DC
  Administered 2015-05-04 (×2): 50 mL/h via INTRAVENOUS
  Administered 2015-05-05: 16:00:00 via INTRAVENOUS

## 2015-05-03 MED ORDER — ACETAMINOPHEN 325 MG PO TABS: 650.0000 mg | ORAL_TABLET | Freq: Four times a day (QID) | ORAL | Status: DC | PRN

## 2015-05-03 MED ORDER — IPRATROPIUM-ALBUTEROL 0.5-2.5 (3) MG/3ML IN SOLN
3.0000 mL | Freq: Once | RESPIRATORY_TRACT | Status: AC
  Administered 2015-05-03: 3 mL via RESPIRATORY_TRACT
  Filled 2015-05-03: qty 3

## 2015-05-03 MED ORDER — OXYCODONE-ACETAMINOPHEN 5-325 MG PO TABS
1.0000 | ORAL_TABLET | ORAL | Status: DC | PRN
  Administered 2015-05-03: 1 via ORAL
  Administered 2015-05-04: 2 via ORAL
  Administered 2015-05-04: 1 via ORAL
  Administered 2015-05-05 – 2015-05-06 (×5): 2 via ORAL
  Administered 2015-05-06: 1 via ORAL
  Administered 2015-05-07 (×2): 2 via ORAL
  Filled 2015-05-03: qty 1
  Filled 2015-05-03 (×5): qty 2
  Filled 2015-05-03: qty 1
  Filled 2015-05-03 (×3): qty 2
  Filled 2015-05-03: qty 1

## 2015-05-03 MED ORDER — HEPARIN SODIUM (PORCINE) 5000 UNIT/ML IJ SOLN
5000.0000 [IU] | Freq: Three times a day (TID) | INTRAMUSCULAR | Status: DC
  Administered 2015-05-04 – 2015-05-05 (×4): 5000 [IU] via SUBCUTANEOUS
  Filled 2015-05-03 (×4): qty 1

## 2015-05-03 MED ORDER — ONDANSETRON HCL 4 MG PO TABS: 4.0000 mg | ORAL_TABLET | Freq: Four times a day (QID) | ORAL | Status: DC | PRN

## 2015-05-03 MED ORDER — ACETAMINOPHEN 650 MG RE SUPP: 650.0000 mg | Freq: Four times a day (QID) | RECTAL | Status: DC | PRN

## 2015-05-03 MED ORDER — ONDANSETRON HCL 4 MG/2ML IJ SOLN: 4.0000 mg | Freq: Four times a day (QID) | INTRAMUSCULAR | Status: DC | PRN

## 2015-05-03 MED ORDER — ALBUTEROL SULFATE (2.5 MG/3ML) 0.083% IN NEBU
2.5000 mg | INHALATION_SOLUTION | Freq: Once | RESPIRATORY_TRACT | Status: AC
Start: 2015-05-03 — End: 2015-05-03
  Administered 2015-05-03: 2.5 mg via RESPIRATORY_TRACT
  Filled 2015-05-03: qty 3

## 2015-05-03 MED ORDER — IOHEXOL 300 MG/ML  SOLN
75.0000 mL | Freq: Once | INTRAMUSCULAR | Status: AC | PRN
  Administered 2015-05-03: 75 mL via INTRAVENOUS

## 2015-05-03 NOTE — ED Provider Notes (Signed)
CSN: [id number]     Arrival date & time 05/03/15  1719 History   First MD Initiated Contact with Patient 05/03/15 1720     Chief Complaint  Patient presents with  . Shortness of Breath     (Consider location/radiation/quality/duration/timing/severity/associated sxs/prior Treatment) Patient is a 54 y.o. male presenting with shortness of breath. The history is provided by the patient (The patient complains of shortness of breath the patient states that he cannot walk for 5 steps without getting short of breath).  Shortness of Breath Severity:  Severe Onset quality:  Gradual Timing:  Constant Progression:  Worsening Chronicity:  Recurrent Context: activity   Associated symptoms: no abdominal pain, no chest pain, no cough, no headaches and no rash     Past Medical History  Diagnosis Date  . Tobacco use disorder 04/23/2015   Past Surgical History  Procedure Laterality Date  . Video bronchoscopy Bilateral 04/27/2015    Procedure: VIDEO BRONCHOSCOPY WITH FLUORO;  Surgeon: Juanito Doom, MD;  Location: [facility];  Service: Cardiopulmonary;  Laterality: Bilateral;   Family History  Problem Relation Age of Onset  . Diabetes Mother    Social History  Substance Use Topics  . Smoking status: Current Every Day Smoker -- 2.00 packs/day    Types: Cigarettes  . Smokeless tobacco: Never Used  . Alcohol Use: Yes    Review of Systems  Constitutional: Negative for appetite change and fatigue.  HENT: Negative for congestion, ear discharge and sinus pressure.   Eyes: Negative for discharge.  Respiratory: Positive for shortness of breath. Negative for cough.   Cardiovascular: Negative for chest pain.  Gastrointestinal: Negative for abdominal pain and diarrhea.  Genitourinary: Negative for frequency and hematuria.  Musculoskeletal: Negative for back pain.  Skin: Negative for rash.  Neurological: Negative for seizures and headaches.  Psychiatric/Behavioral: Negative for  hallucinations.      Allergies  Review of patient's allergies indicates no known allergies.  Home Medications   Prior to Admission medications   Not on File   BP 128/86 mmHg  Pulse 94  Temp(Src) 97.9 F (36.6 C) (Oral)  Resp 18  Ht '5\' 8"'$  (1.727 m)  Wt 140 lb (63.504 kg)  BMI 21.29 kg/m2  SpO2 96% Physical Exam  Constitutional: He is oriented to person, place, and time. He appears well-developed.  HENT:  Head: Normocephalic.  Eyes: Conjunctivae and EOM are normal. No scleral icterus.  Neck: Neck supple. No thyromegaly present.  Cardiovascular: Normal rate and regular rhythm.  Exam reveals no gallop and no friction rub.   No murmur heard. Pulmonary/Chest: No stridor. He has no wheezes. He has no rales. He exhibits no tenderness.  Decrease breath sounds right lung  Abdominal: He exhibits no distension. There is no tenderness. There is no rebound.  Musculoskeletal: Normal range of motion. He exhibits no edema.  Lymphadenopathy:    He has no cervical adenopathy.  Neurological: He is oriented to person, place, and time. He exhibits normal muscle tone. Coordination normal.  Skin: No rash noted. No erythema.  Psychiatric: He has a normal mood and affect. His behavior is normal.    ED Course  Procedures (including critical care time) Labs Review Labs Reviewed  CBC WITH DIFFERENTIAL/PLATELET - Abnormal; Notable for the following:    WBC 14.2 (*)    RBC 3.99 (*)    Hemoglobin 11.6 (*)    HCT 34.7 (*)    Platelets 726 (*)    Neutro Abs 9.8 (*)    Monocytes Absolute  1.5 (*)    Eosinophils Absolute 0.8 (*)    All other components within normal limits  COMPREHENSIVE METABOLIC PANEL - Abnormal; Notable for the following:    Sodium 134 (*)    Chloride 98 (*)    Albumin 2.5 (*)    Total Bilirubin 0.2 (*)    All other components within normal limits    Imaging Review Dg Chest 2 View  05/03/2015  CLINICAL DATA:  Shortness of breath, mild chest pain, had fluid drawn from  RIGHT hemi thorax last week EXAM: CHEST  2 VIEW COMPARISON:  04/27/2015 FINDINGS: Subtotal opacification of the RIGHT hemi thorax by a combination of fluid and increased lung atelectasis versus consolidation. Slight mediastinal shift to the LEFT. Stable heart size and pulmonary vascularity. Underlying emphysematous changes with linear subsegmental atelectasis versus scarring in LEFT upper lobe. Questionable mild LEFT perihilar infiltrate. No LEFT pleural effusion or pneumothorax. IMPRESSION: Increased opacification of the RIGHT hemi thorax by combination of pleural effusion and increased RIGHT lung atelectasis versus consolidation. COPD changes with atelectasis versus scarring in LEFT upper lobe and question mild LEFT perihilar infiltrate. Electronically Signed   By: Lavonia Dana M.D.   On: 05/03/2015 17:56   Ct Chest W Contrast  05/03/2015  CLINICAL DATA:  Chest pain and shortness of breath for couple weeks, recent CT chest with RIGHT pleural effusion, underwent thoracentesis, re-accumulation of fluid in increased infiltrate, history COPD ; cytology positive for malignant effusion though uncertain as to whether pulmonary or GI source EXAM: CT CHEST WITH CONTRAST TECHNIQUE: Multidetector CT imaging of the chest was performed during intravenous contrast administration. Sagittal and coronal MPR images reconstructed from axial data set. CONTRAST:  72m OMNIPAQUE IOHEXOL 300 MG/ML  SOLN IV COMPARISON:  04/22/2015; correlation chest radiograph 05/03/2015 FINDINGS: Mild scattered atherosclerotic calcification aorta and coronary arteries. Persistent large at least partially loculated RIGHT pleural effusion with pleural thickening and scattered enhancement consistent with malignant effusion. Multiple normal sized lymph nodes at RIGHT cardiophrenic angle. Complete atelectasis of RIGHT middle and RIGHT lower lobes. Extensive opacity within LEFT upper lobe without significant volume loss ; this may represent diffuse  infiltrate though tumor is not excluded. Abnormal soft tissue at RIGHT hilum suspicious for adenopathy, node 17 mm short axis image 30. Enlarged precarinal lymph node 16 mm short axis image 29. Loculated fluid collection at RIGHT apex. Scattered upper normal sized LEFT hilar nodes. Visualized upper abdomen unremarkable. Large bulla LEFT apex 12.2 cm diameter. Again identified multiple subpleural nodular foci in LEFT lung question tumor versus infiltrate, largest 12 mm diameter. Scattered areas of minimal infiltrate in the LEFT upper and LEFT lower lobes are slightly increased since previous exam. No LEFT pleural effusion or pneumothorax. Degenerative disc disease changes lower cervical and mid thoracic spine. No definite osseous metastatic lesions. IMPRESSION: Persistent large at least partially loculated RIGHT pleural effusion with scattered areas of pleural thickening and enhancement consistent with malignant pleural effusion as identified by prior thoracentesis and cytology. Complete atelectasis of RIGHT middle and RIGHT lower lobes. Subtotal opacification of the RIGHT upper lobe may represent diffuse consolidation or tumor. Mediastinal and suspected RIGHT hilar adenopathy with upper normal sized LEFT hilar nodes. Minimal patchy infiltrate LEFT lung increased since previous exam including several subpleural nodular foci in the LEFT upper and LEFT lower lobes, question infection versus tumor. 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 Electronically Signed   By: MLavonia DanaM.D.   On: 05/03/2015 18:59   I have personally reviewed and  evaluated these images and lab results as part of my medical decision-making.   EKG Interpretation   Date/Time:  Tuesday May 03 2015 17:30:28 EDT Ventricular Rate:  83 PR Interval:  114 QRS Duration: 78 QT Interval:  374 QTC Calculation: 439 R Axis:   87 Text Interpretation:  Sinus rhythm Borderline short PR interval Confirmed  by Cacey Willow  MD,  Netra Postlethwait [phone]) on 05/03/2015 7:31:32 PM      MDM   Final diagnoses:  Dyspnea    CT scan shows worsening effusion on the right lung from his lung cancer. patient will be admitted to the hospital. Patient will be seen by oncology again    [name] Ferguson, MD 05/03/15 2014

## 2015-05-03 NOTE — ED Notes (Signed)
Pt brought in by EMS for SOB. Pt was seen by EMS last night and given neb tx but refused transport. Pt called back today.

## 2015-05-03 NOTE — H&P (Signed)
[facility] History and Physical  Brian Nelson NKN:[id number] DOB: Sep 11, 1960    PCP:   [facility]   Chief Complaint: SOB.   Newly diagnosed of lung cancer  HPI: Brian Nelson is an 54 y.o. male with hx of newly diagnosed lung cancer, unknown if primary or mets, hx of malnutrition, on going tobacco abuse, hx of prior incarceration, presented to the ER with increase SOB.  He denied chest pain, fever, chills, or any other symptomology.  He had seen Dr Lake Bells, and had bronchoscopy, with bronchial washing showed adenocarcinoma, and also had elevated CEA, pending colonoscopy.  He had seen Dr Whitney Muse of oncology and GI as well.  Work up [employer] included Hb of 12 g per dL, and normal renal and liver Fx tests.  His EKG showed NSR with no acute ST T changes.  Hospitalist was asked to admit him for supplemental oxygen, and consider further inpatient work up.   Rewiew of Systems:  Constitutional: Negative for malaise, fever and chills. No weight gain Eyes: Negative for eye pain, redness and discharge, diplopia, visual changes, or flashes of light. ENMT: Negative for ear pain, hoarseness, nasal congestion, sinus pressure and sore throat. No headaches; tinnitus, drooling, or problem swallowing. Cardiovascular: Negative for chest pain, palpitations, diaphoresis, dyspnea and peripheral edema. ; No orthopnea, PND Respiratory: Negative for cough, hemoptysis, wheezing and stridor. No pleuritic chestpain. Gastrointestinal: Negative for nausea, vomiting, diarrhea, constipation, abdominal pain, melena, blood in stool, hematemesis, jaundice and rectal bleeding.    Genitourinary: Negative for frequency, dysuria, incontinence,flank pain and hematuria; Musculoskeletal: Negative for back pain and neck pain. Negative for swelling and trauma.;  Skin: . Negative for pruritus, rash, abrasions, bruising and skin lesion.; ulcerations Neuro: Negative for headache, lightheadedness and neck  stiffness. Negative for weakness, altered level of consciousness , altered mental status, extremity weakness, burning feet, involuntary movement, seizure and syncope.  Psych: negative for anxiety, depression, insomnia, tearfulness, panic attacks, hallucinations, paranoia, suicidal or homicidal ideation    Past Medical History  Diagnosis Date  . Tobacco use disorder 04/23/2015    Past Surgical History  Procedure Laterality Date  . Video bronchoscopy Bilateral 04/27/2015    Procedure: VIDEO BRONCHOSCOPY WITH FLUORO;  Surgeon: Juanito Doom, MD;  Location: [facility];  Service: Cardiopulmonary;  Laterality: Bilateral;    Medications:  HOME MEDS: Prior to Admission medications   Not on File     Allergies:  No Known Allergies  Social History:   reports that he has been smoking Cigarettes.  He has been smoking about 2.00 packs per day. He has never used smokeless tobacco. He reports that he drinks alcohol. His drug history is not on file.  Family History: Family History  Problem Relation Age of Onset  . Diabetes Mother      Physical Exam: Filed Vitals:   05/03/15 1809 05/03/15 1830 05/03/15 1900 05/03/15 2006  BP:  122/80 128/80 128/86  Pulse:  96 100 94  Temp:      TempSrc:      Resp:  '18 20 18  '$ Height:      Weight:      SpO2: 96% 96% 94% 96%   Blood pressure 128/86, pulse 94, temperature 97.9 F (36.6 C), temperature source Oral, resp. rate 18, height '5\' 8"'$  (1.727 m), weight 63.504 kg (140 lb), SpO2 96 %.  GEN:  Pleasant patient lying in the stretcher in no acute distress; cooperative with exam. PSYCH:  alert and oriented x4; does not appear  anxious or depressed; affect is appropriate. HEENT: Mucous membranes pink and anicteric; PERRLA; EOM intact; no cervical lymphadenopathy nor thyromegaly or carotid bruit; no JVD; There were no stridor. Neck is very supple. Breasts:: Not examined CHEST WALL: No tenderness CHEST: Normal respiration, Decrease BS throughout.   HEART: Regular rate and rhythm.  There are no murmur, rub, or gallops.   BACK: No kyphosis or scoliosis; no CVA tenderness ABDOMEN: soft and non-tender; no masses, no organomegaly, normal abdominal bowel sounds; no pannus; no intertriginous candida. There is no rebound and no distention. Rectal Exam: Not done EXTREMITIES: No bone or joint deformity; age-appropriate arthropathy of the hands and knees; no edema; no ulcerations.  There is no calf tenderness. Genitalia: not examined PULSES: 2+ and symmetric SKIN: Normal hydration no rash or ulceration CNS: Cranial nerves 2-12 grossly intact no focal lateralizing neurologic deficit.  Speech is fluent; uvula elevated with phonation, facial symmetry and tongue midline. DTR are normal bilaterally, cerebella exam is intact, barbinski is negative and strengths are equaled bilaterally.  No sensory loss.   Labs on Admission:  Basic Metabolic Panel:  Recent Labs Lab 04/27/15 0715 05/03/15 1814  NA 133* 134*  K 4.2 4.4  CL 95* 98*  CO2 27 28  GLUCOSE 89 93  BUN 14 12  CREATININE 0.94 0.81  CALCIUM 9.0 8.9   Liver Function Tests:  Recent Labs Lab 05/03/15 1814  AST 17  ALT 32  ALKPHOS 104  BILITOT 0.2*  PROT 6.7  ALBUMIN 2.5*    CBC:  Recent Labs Lab 04/27/15 0715 05/03/15 1814  WBC 13.6* 14.2*  NEUTROABS 8.4* 9.8*  HGB 12.0* 11.6*  HCT 35.2* 34.7*  MCV 85.2 87.0  PLT 681* 726*    Radiological Exams on Admission: Dg Chest 2 View  05/03/2015  CLINICAL DATA:  Shortness of breath, mild chest pain, had fluid drawn from RIGHT hemi thorax last week EXAM: CHEST  2 VIEW COMPARISON:  04/27/2015 FINDINGS: Subtotal opacification of the RIGHT hemi thorax by a combination of fluid and increased lung atelectasis versus consolidation. Slight mediastinal shift to the LEFT. Stable heart size and pulmonary vascularity. Underlying emphysematous changes with linear subsegmental atelectasis versus scarring in LEFT upper lobe. Questionable mild  LEFT perihilar infiltrate. No LEFT pleural effusion or pneumothorax. IMPRESSION: Increased opacification of the RIGHT hemi thorax by combination of pleural effusion and increased RIGHT lung atelectasis versus consolidation. COPD changes with atelectasis versus scarring in LEFT upper lobe and question mild LEFT perihilar infiltrate. Electronically Signed   By: Lavonia Dana M.D.   On: 05/03/2015 17:56   Ct Chest W Contrast  05/03/2015  CLINICAL DATA:  Chest pain and shortness of breath for couple weeks, recent CT chest with RIGHT pleural effusion, underwent thoracentesis, re-accumulation of fluid in increased infiltrate, history COPD ; cytology positive for malignant effusion though uncertain as to whether pulmonary or GI source EXAM: CT CHEST WITH CONTRAST TECHNIQUE: Multidetector CT imaging of the chest was performed during intravenous contrast administration. Sagittal and coronal MPR images reconstructed from axial data set. CONTRAST:  36m OMNIPAQUE IOHEXOL 300 MG/ML  SOLN IV COMPARISON:  04/22/2015; correlation chest radiograph 05/03/2015 FINDINGS: Mild scattered atherosclerotic calcification aorta and coronary arteries. Persistent large at least partially loculated RIGHT pleural effusion with pleural thickening and scattered enhancement consistent with malignant effusion. Multiple normal sized lymph nodes at RIGHT cardiophrenic angle. Complete atelectasis of RIGHT middle and RIGHT lower lobes. Extensive opacity within LEFT upper lobe without significant volume loss ; this may represent diffuse infiltrate  though tumor is not excluded. Abnormal soft tissue at RIGHT hilum suspicious for adenopathy, node 17 mm short axis image 30. Enlarged precarinal lymph node 16 mm short axis image 29. Loculated fluid collection at RIGHT apex. Scattered upper normal sized LEFT hilar nodes. Visualized upper abdomen unremarkable. Large bulla LEFT apex 12.2 cm diameter. Again identified multiple subpleural nodular foci in LEFT  lung question tumor versus infiltrate, largest 12 mm diameter. Scattered areas of minimal infiltrate in the LEFT upper and LEFT lower lobes are slightly increased since previous exam. No LEFT pleural effusion or pneumothorax. Degenerative disc disease changes lower cervical and mid thoracic spine. No definite osseous metastatic lesions. IMPRESSION: Persistent large at least partially loculated RIGHT pleural effusion with scattered areas of pleural thickening and enhancement consistent with malignant pleural effusion as identified by prior thoracentesis and cytology. Complete atelectasis of RIGHT middle and RIGHT lower lobes. Subtotal opacification of the RIGHT upper lobe may represent diffuse consolidation or tumor. Mediastinal and suspected RIGHT hilar adenopathy with upper normal sized LEFT hilar nodes. Minimal patchy infiltrate LEFT lung increased since previous exam including several subpleural nodular foci in the LEFT upper and LEFT lower lobes, question infection versus tumor. 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 Electronically Signed   By: Lavonia Dana M.D.   On: 05/03/2015 18:59    Assessment/Plan Present on Admission:  . Cancer ([place]) . COPD exacerbation ([place]) . Malnutrition of moderate degree . Pleural effusion on right . Tobacco use disorder . Shortness of breath  PLAN:  Patient will be admitted to facilitate the work up of his recently diagnosed lung cancer now requiring oxygen supplementation.   Will consult oncology and GI.  He is otherwise stable and will be admitted into the general medical floor.  I will continue with his home meds.   Will admit him to Dr Le[facility] as per prior arrangement.  Thank you  Other plans as per orders.  Code Status: FULL Haskel Khan, MD. [facility] Pager [phone] 7pm to 7am.  05/03/2015, 8:37 PM

## 2015-05-03 NOTE — ED Notes (Signed)
Pt's family had turned the monitor off because the noise was bothering them. Pt placed back on  Monitor and importance explained to family.

## 2015-05-03 NOTE — ED Notes (Signed)
Pt had taken BP cuff off. Placed back on arm.

## 2015-05-03 NOTE — ED Notes (Signed)
Report given to floor. Pt ready for transport. 

## 2015-05-03 NOTE — Telephone Encounter (Signed)
I spoke with Lattie Haw and she is aware that patient will need referral from his PCP first and then try to get him scheduled an OV within 2 weeks per LSL

## 2015-05-04 ENCOUNTER — Observation Stay (HOSPITAL_COMMUNITY): Payer: Medicaid Other

## 2015-05-04 DIAGNOSIS — C3490 Malignant neoplasm of unspecified part of unspecified bronchus or lung: Secondary | ICD-10-CM | POA: Diagnosis not present

## 2015-05-04 DIAGNOSIS — F1490 Cocaine use, unspecified, uncomplicated: Secondary | ICD-10-CM

## 2015-05-04 DIAGNOSIS — J910 Malignant pleural effusion: Secondary | ICD-10-CM | POA: Diagnosis not present

## 2015-05-04 DIAGNOSIS — D473 Essential (hemorrhagic) thrombocythemia: Secondary | ICD-10-CM

## 2015-05-04 DIAGNOSIS — D649 Anemia, unspecified: Secondary | ICD-10-CM

## 2015-05-04 DIAGNOSIS — D72829 Elevated white blood cell count, unspecified: Secondary | ICD-10-CM | POA: Diagnosis not present

## 2015-05-04 DIAGNOSIS — F1010 Alcohol abuse, uncomplicated: Secondary | ICD-10-CM

## 2015-05-04 DIAGNOSIS — Z720 Tobacco use: Secondary | ICD-10-CM

## 2015-05-04 NOTE — Consult Note (Signed)
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 [place] Bells.  HISTORY OF PRESENT ILLNESS:   Brian Nelson is a 54 year old man with a past medical history significant for COPD, tobacco abuse and EtOH abuse who presents to the [facility] ED on 10/18 with progressive shortness of breath following discharge from [place] on 04/28/2015.  I personally reviewed and went over laboratory results with the patient.  The results are noted within this dictation.  I personally reviewed and went over radiographic studies with the patient.  The results are noted within this dictation.    I personally reviewed and went over pathology results with the patient.  Chart is reviewed.  He was previously admitted on 04/22/2015 with chest pain.  This was worked up in the [facility] ED on 04/22/2015 by Dr. Rogene Houston.  His ED note clearly states that the patient is not clinically acting like a severe or significant advanced pneumonia or empyema.  The patient is afebrile, non-toxic, not hypotensive, not hypoxic, and not tachycardic.  He notes in his report that hospitalist is not willing to admit the patient here at [facility] and instead she will be arranging for transfer to [facility].  He was discharged from [facility] on 04/28/2015 following a thoracentesis and bronchoscopy.  Thoracentesis cytology reports a Non-small cell carcinoma, but CDX-2 positive raising concern for GI primary. On 04/27/2015, patient underwent bronchoscopy by Dr. [place] Bells and bronchial lavage was positive for malignancy favoring adenocarcinoma.  He presented to [facility] as noted above.  He is S/P thoracentesis again on the right relieving 1.4 L of fluid.  This was sent for  cytology again.    He reports that following discharge from past hospitalization, he became dyspneic.  As a result, he called 911 and he was brought to ED via EMS.  He does not have a long standing history of a primary care provider.  Dr. Legrand Rams is his new primary care provider.  He admits to feeling poorly about 1 month ago.  He notes to a recent history of hemoptysis.  He admits to being incarcerated from 2000- 2007 for stealing motor vehicle(s).  I had a difficult time ascertaining a history because Brian Nelson was very talkative.  I have updated his medical history below according to information I was able to get.   PAST MEDICAL HISTORY:   Past Medical History  Diagnosis Date  . Tobacco use disorder 04/23/2015    Quit Fall 2016  . ETOH abuse     Quit Fall 2016  . Illicit drug use     Quit Fall 2016    ALLERGIES: No Known Allergies    MEDICATIONS: I have reviewed the patient's current medications.     PAST SURGICAL HISTORY Past Surgical History  Procedure Laterality Date  . Video bronchoscopy Bilateral 04/27/2015    Procedure: VIDEO BRONCHOSCOPY WITH FLUORO;  Surgeon: Juanito Doom, MD;  Location: [place];  Service: Cardiopulmonary;  Laterality: Bilateral;    FAMILY HISTORY: Family History  Problem Relation Age of Onset  . Diabetes Mother    His mother is 30 years old and relatively healthy.  He has 1 sister who is 32 years old.  His father passes  away in his 57's.  He has 2 children, 2 boys, ages 108 and 39.  SOCIAL HISTORY:  reports that he quit smoking about 1 weeks ago. His smoking use included Cigarettes. He has a 80 pack-year smoking history. He has never used smokeless tobacco. He reports that he drinks about 3.6 oz of alcohol per week. He reports that he uses illicit drugs (Cocaine and Marijuana).  He is divorced.  PERFORMANCE STATUS: The patient's performance status is 2 - Symptomatic, <50% confined to bed  PHYSICAL EXAM: Most Recent Vital Signs: Blood  pressure 131/70, pulse 74, temperature 98 F (36.7 C), temperature source Oral, resp. rate 20, height $RemoveBe'5\' 8"'aHZRxzwCI$  (1.727 m), weight 132 lb 4.8 oz (60.011 kg), SpO2 100 %. General appearance: alert, cooperative, appears older than stated age, cachectic, mild distress and dyspneic on long conversation, no family at the bedside. Head: Normocephalic, without obvious abnormality, atraumatic Eyes: negative findings: lids and lashes normal, conjunctivae and sclerae normal and corneas clear Ears: external ears without any abnormalities Throat: normal findings: lips normal without lesions and buccal mucosa normal Neck: no adenopathy and supple, symmetrical, trachea midline Back: symmetric, no curvature. ROM normal. No CVA tenderness. Lungs: diminished breath sounds bilaterally Heart: regularly irregular rhythm Abdomen: soft, non-tender; bowel sounds normal; no masses,  no organomegaly Extremities: extremities normal, atraumatic, no cyanosis or edema Pulses: 2+ and symmetric Skin: Skin color, texture, turgor normal. No rashes or lesions  Neuro: A and O x 3.  No focal deficits noted.  LABORATORY DATA:  Results for orders placed or performed during the hospital encounter of 05/03/15 (from the past 48 hour(s))  CBC with Differential/Platelet     Status: Abnormal   Collection Time: 05/03/15  6:14 PM  Result Value Ref Range   WBC 14.2 (H) 4.0 - 10.5 K/uL   RBC 3.99 (L) 4.22 - 5.81 MIL/uL   Hemoglobin 11.6 (L) 13.0 - 17.0 g/dL   HCT 34.7 (L) 39.0 - 52.0 %   MCV 87.0 78.0 - 100.0 fL   MCH 29.1 26.0 - 34.0 pg   MCHC 33.4 30.0 - 36.0 g/dL   RDW 14.2 11.5 - 15.5 %   Platelets 726 (H) 150 - 400 K/uL   Neutrophils Relative % 69 %   Neutro Abs 9.8 (H) 1.7 - 7.7 K/uL   Lymphocytes Relative 14 %   Lymphs Abs 2.0 0.7 - 4.0 K/uL   Monocytes Relative 10 %   Monocytes Absolute 1.5 (H) 0.1 - 1.0 K/uL   Eosinophils Relative 6 %   Eosinophils Absolute 0.8 (H) 0.0 - 0.7 K/uL   Basophils Relative 1 %   Basophils  Absolute 0.1 0.0 - 0.1 K/uL  Comprehensive metabolic panel     Status: Abnormal   Collection Time: 05/03/15  6:14 PM  Result Value Ref Range   Sodium 134 (L) 135 - 145 mmol/L   Potassium 4.4 3.5 - 5.1 mmol/L   Chloride 98 (L) 101 - 111 mmol/L   CO2 28 22 - 32 mmol/L   Glucose, Bld 93 65 - 99 mg/dL   BUN 12 6 - 20 mg/dL   Creatinine, Ser 0.81 0.61 - 1.24 mg/dL   Calcium 8.9 8.9 - 10.3 mg/dL   Total Protein 6.7 6.5 - 8.1 g/dL   Albumin 2.5 (L) 3.5 - 5.0 g/dL   AST 17 15 - 41 U/L   ALT 32 17 - 63 U/L   Alkaline Phosphatase 104 38 - 126 U/L   Total Bilirubin 0.2 (L) 0.3 - 1.2  mg/dL   GFR calc non Af Amer >60 >60 mL/min   GFR calc Af Amer >60 >60 mL/min    Comment: (NOTE) The eGFR has been calculated using the CKD EPI equation. This calculation has not been validated in all clinical situations. eGFR's persistently <60 mL/min signify possible Chronic Kidney Disease.    Anion gap 8 5 - 15      RADIOGRAPHY: Dg Chest 1 View  05/04/2015  CLINICAL DATA:  Post right thoracentesis EXAM: CHEST 1 VIEW COMPARISON:  05/03/2015 FINDINGS: Decreasing right effusion. Moderate partially loculated right effusion persists with diffuse right lung airspace disease. No pneumothorax. Underlying COPD. Linear scarring or atelectasis in the left upper lobe. Heart is normal size. IMPRESSION: Decreasing right effusion with moderate partially loculated right effusion persisting. No pneumothorax. Diffuse right lung airspace disease. COPD. Electronically Signed   By: Rolm Baptise M.D.   On: 05/04/2015 12:30   Dg Chest 2 View  05/03/2015  CLINICAL DATA:  Shortness of breath, mild chest pain, had fluid drawn from RIGHT hemi thorax last week EXAM: CHEST  2 VIEW COMPARISON:  04/27/2015 FINDINGS: Subtotal opacification of the RIGHT hemi thorax by a combination of fluid and increased lung atelectasis versus consolidation. Slight mediastinal shift to the LEFT. Stable heart size and pulmonary vascularity. Underlying  emphysematous changes with linear subsegmental atelectasis versus scarring in LEFT upper lobe. Questionable mild LEFT perihilar infiltrate. No LEFT pleural effusion or pneumothorax. IMPRESSION: Increased opacification of the RIGHT hemi thorax by combination of pleural effusion and increased RIGHT lung atelectasis versus consolidation. COPD changes with atelectasis versus scarring in LEFT upper lobe and question mild LEFT perihilar infiltrate. Electronically Signed   By: Lavonia Dana M.D.   On: 05/03/2015 17:56   Ct Chest W Contrast  05/03/2015  CLINICAL DATA:  Chest pain and shortness of breath for couple weeks, recent CT chest with RIGHT pleural effusion, underwent thoracentesis, re-accumulation of fluid in increased infiltrate, history COPD ; cytology positive for malignant effusion though uncertain as to whether pulmonary or GI source EXAM: CT CHEST WITH CONTRAST TECHNIQUE: Multidetector CT imaging of the chest was performed during intravenous contrast administration. Sagittal and coronal MPR images reconstructed from axial data set. CONTRAST:  10mL OMNIPAQUE IOHEXOL 300 MG/ML  SOLN IV COMPARISON:  04/22/2015; correlation chest radiograph 05/03/2015 FINDINGS: Mild scattered atherosclerotic calcification aorta and coronary arteries. Persistent large at least partially loculated RIGHT pleural effusion with pleural thickening and scattered enhancement consistent with malignant effusion. Multiple normal sized lymph nodes at RIGHT cardiophrenic angle. Complete atelectasis of RIGHT middle and RIGHT lower lobes. Extensive opacity within LEFT upper lobe without significant volume loss ; this may represent diffuse infiltrate though tumor is not excluded. Abnormal soft tissue at RIGHT hilum suspicious for adenopathy, node 17 mm short axis image 30. Enlarged precarinal lymph node 16 mm short axis image 29. Loculated fluid collection at RIGHT apex. Scattered upper normal sized LEFT hilar nodes. Visualized upper abdomen  unremarkable. Large bulla LEFT apex 12.2 cm diameter. Again identified multiple subpleural nodular foci in LEFT lung question tumor versus infiltrate, largest 12 mm diameter. Scattered areas of minimal infiltrate in the LEFT upper and LEFT lower lobes are slightly increased since previous exam. No LEFT pleural effusion or pneumothorax. Degenerative disc disease changes lower cervical and mid thoracic spine. No definite osseous metastatic lesions. IMPRESSION: Persistent large at least partially loculated RIGHT pleural effusion with scattered areas of pleural thickening and enhancement consistent with malignant pleural effusion as identified by prior thoracentesis and cytology. Complete  atelectasis of RIGHT middle and RIGHT lower lobes. Subtotal opacification of the RIGHT upper lobe may represent diffuse consolidation or tumor. Mediastinal and suspected RIGHT hilar adenopathy with upper normal sized LEFT hilar nodes. Minimal patchy infiltrate LEFT lung increased since previous exam including several subpleural nodular foci in the LEFT upper and LEFT lower lobes, question infection versus tumor. 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 Electronically Signed   By: Lavonia Dana M.D.   On: 05/03/2015 18:59   US Thoracentesis Asp Pleural Space W/img Guide  05/04/2015  CLINICAL DATA:  Right pleural effusion EXAM: ULTRASOUND GUIDED RIGHT THORACENTESIS COMPARISON:  Chest CT 05/03/2015 PROCEDURE: An ultrasound guided thoracentesis was thoroughly discussed with the patient and questions answered. The benefits, risks, alternatives and complications were also discussed. The patient understands and wishes to proceed with the procedure. Written consent was obtained. Ultrasound was performed to localize and mark an adequate pocket of fluid in the right posterior chest. The area was then prepped and draped in the normal sterile fashion. 1% Lidocaine was used for local anesthesia. Under ultrasound guidance a  19 gauge Yueh catheter was introduced. Thoracentesis was performed. The catheter was removed and a dressing applied. COMPLICATIONS: None. FINDINGS: A total of approximately 1.4 L of bloody fluid was removed. A fluid sample wassent for laboratory analysis. IMPRESSION: Successful ultrasound guided right thoracentesis yielding 1.4 L of pleural fluid. Electronically Signed   By: Rolm Baptise M.D.   On: 05/04/2015 12:31       PATHOLOGY:    Diagnosis PLEURAL FLUID, RIGHT (SPECIMEN 1 OF 1 COLLECTED 04/23/2015) MALIGNANT CELLS PRESENT, CONSISTENT WITH NON SMALL CELL CARCINOMA. SEE COMMENT. COMMENT: THE MALIGNANT CELLS ARE POSITIVE FOR MOC-31, CYTOKERATIN AE1/AE3, AND CDX-2. THEY ARE NEGATIVE FOR CHROMOGRANIN, NAPSIN, TTF-1, WT-1, SYNAPTOPHYSIN, CYTOKERATIN 5/6, CD56, AND CALRETININ. THIS PROFILE IS NON-SPECIFIC, BUT SUPPORTS THE DIAGNOSIS OF CARCINOMA. THE POSITIVE STAINING WITH CDX-2 RAISES THE POSSIBILITY OF A GASTROINTESTINAL PRIMARY, ALTHOUGH RADIOLOGIC AND CLINICAL CORRELATION IS NEEDED. DR. Jenny Reichmann PATRICK HAS REVIEWED THE CASE AND IS IN ESSENTIAL AGREEMENT WITH THIS INTERPRETATION. Enid Cutter MD Pathologist, Electronic Signature (Case signed 04/27/2015)  Diagnosis Bronchus, biopsy, Right upper lobe bronchial - BENIGN BRONCHIAL TISSUE. - NO GRANULOMAS IDENTIFIED. - NO FUNGAL ORGANISMS BY PAS AND GMS. - NO ACID FAST ORGANISMS BY AF[employer]. - NO MALIGNANCY. Vicente Males MD Pathologist, Electronic Signature (Case signed 04/28/2015)  Diagnosis BRONCHIAL BRUSHING, RIGHT UPPER LOBE (SPECIMEN 2 OF 2 COLLECTED 04-27-2015) SUSPICIOUS FOR MALIGNANCY. Vicente Males MD Pathologist, Electronic Signature (Case signed 04/28/2015)  Diagnosis BRONCHIAL LAVAGE RIGHT UPPER LOBE (SPECIMEN 1 OF 2 COLLECTED 04/27/2015) MALIGNANT CELLS PRESENT, SEE COMMENT. Vicente Males MD Pathologist, Electronic Signature (Case signed 04/28/2015)   ASSESSMENT/PLAN:   Findings suspicious for primary bronchogenic carcinoma,  based upon radiographic findings and overall history  Pathology/cytology confirms a non-small cell malignancy with features favoring adenocarcinoma.  He is S/P CT imaging for staging.  He is S/P second right thoracentesis.  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  Thoracentesis on 05/04/2015 on right relieved 1.4 L of fluid which will be sent for cytology.  Hopefully enough cells are present that can be tested for EGFR and ALK.  Previous work-up in [place] is noted.  He had an appointment at the [facility] on 05/05/2015.  Reviewing the patient's chart, it does not appear a transfer to [place] was needed or indicated.  As a result, his work-up has  been slowed and hindered.  I have asked our nurse navigator to contact pathology to send tissue off for [facility] of unknown primary given the patient's negative TTF-1 and positive CDX-2 staining results.  CDX-2 positivity is raises the question regarding GI tumor.  GI is consulted.  Colonoscopy would be helpful to rule out GI tumor to help narrow down primary tumor type, particularly in light of an elevated CEA.  Assuming this is a primary bronchogenic carcinoma given negative CT abd/pelvis, I have also asked her to send for ALK/EGFR (despite his strong smoking history).  There is a low likelihood that he would be a candidate for PO targeted therapy, however, we will check with aforementioned requested tests.  He will need a bone scan to rule out osseous involvement as well; this order is placed.  I have consulted Gen Surg for consideration of inpatient versus outpatient port placement.  I discussed the role of a port a cath with the patient.  He understands that it is a device for the administration of systemic chemotherapy.  Will defer to Gen Surg.  He certainly would be appropriate for outpatient port placement if decided upon.  "I don't want  to get out of the hospital until I'm straight."  He is educated that his shortness of breath may not resolve until treatment is started with response. He may need a pleuridex placed as well.  Leukocytosis- stable Anemia- normochromic, normocytic, stable.  Anemia panel ordered to rule out vitamin deficiency. Thrombocytosis- likely reactive to malignancy  Tobacco abuse- smoked 1.5 ppd x 40 years quitting a few days ago EtOH abuse- H/O drinking 6 pack of beer per week, quitting recently H/O marijuana/cocaine abuse- last use a few days- weeks ago.    All questions were answered. The patient knows to call the clinic with any problems, questions or concerns. We can certainly see the patient much sooner if necessary.  Patient and plan discussed with Dr. Ancil Linsey and she is in agreement with the aforementioned.    KEFALAS,THOMAS  05/05/2015 8:30 AM   As above. Patient seen and examined.  Further evaluation is necessary given CDX-2 positivity. Have requested several genetic studies be sent. He may need a pleurex catheter placed for his symptomatic effusion until he starts chemotherapy. We discussed the plans with the patient in detail. He seems to have a good understanding. Will f/u in am. Donald Pore MD

## 2015-05-04 NOTE — Care Management Note (Signed)
Case Management Note  Patient Details  Name: Brian Nelson MRN: [id number] Date of Birth: 08/21/1960  Subjective/Objective:                  Pt admitted from home with SOB. Pt lives with his mother and will return home at discharge. Pt is independent with ADL's.   Action/Plan: Will continue to follow for discharge planning needs. Will need home O2 assessment prior to discharge.  Expected Discharge Date:  05/06/15               Expected Discharge Plan:  Home/Self Care  In-House Referral:  NA  Discharge planning Services  CM Consult  Post Acute Care Choice:  Durable Medical Equipment Choice offered to:  Patient  DME Arranged:    DME Agency:     HH Arranged:    [place] Agency:     Status of Service:  In process, will continue to follow  Medicare Important Message Given:    Date Medicare IM Given:    Medicare IM give by:    Date Additional Medicare IM Given:    Additional Medicare Important Message give by:     If discussed at [place] of Stay Meetings, dates discussed:    Additional Comments:  Joylene Draft, RN 05/04/2015, 10:28 AM

## 2015-05-04 NOTE — Consult Note (Signed)
Consult requested by: Dr. Legrand Rams Consult requested for lung cancer and pleural effusion:  HPI: This is a 54 year old who is been worked up for abnormal chest x-ray and who has had thoracentesis previously and then had bronchoscopy with biopsy and bronchial washings done on the 12th. Cytology from that is positive for what appears to be adenocarcinoma but the biopsy tissue was not positive for cancer. He became more short of breath over the last several days and came back to the emergency department and was found to have reaccumulation of pleural fluid. He has a significant smoking history but stopped recently. No previous history of any sort of lung disease although he says he does have some cough so I suspect he has at least some element of COPD.  Past Medical History  Diagnosis Date  . Tobacco use disorder 04/23/2015     Family History  Problem Relation Age of Onset  . Diabetes Mother      Social History   Social History  . Marital Status: Married    Spouse Name: N/A  . Number of Children: N/A  . Years of Education: N/A   Social History Main Topics  . Smoking status: Current Every Day Smoker -- 2.00 packs/day    Types: Cigarettes  . Smokeless tobacco: Never Used  . Alcohol Use: Yes  . Drug Use: None  . Sexual Activity: Not Asked   Other Topics Concern  . None   Social History Narrative     ROS: He has coughed up some bloody sputum. He has poor appetite and says he thinks he's lost weight based on the way his clothes fit. He has had some chest pain. Otherwise essentially negative    Objective: Vital signs in last 24 hours: Temp:  [97.9 F (36.6 C)-98.4 F (36.9 C)] 98 F (36.7 C) (10/19 0500) Pulse Rate:  [83-108] 83 (10/19 0500) Resp:  [17-32] 24 (10/19 0500) BP: (111-158)/(61-88) 115/72 mmHg (10/19 0500) SpO2:  [93 %-97 %] 97 % (10/19 0738) Weight:  [60.011 kg (132 lb 4.8 oz)-63.504 kg (140 lb)] 60.011 kg (132 lb 4.8 oz) (10/18 2228) Weight change:  Last BM  Date: 05/03/15  Intake/Output from previous day: 10/18 0701 - 10/19 0700 In: 340.8 [I.V.:340.8] Out: -   PHYSICAL EXAM He is awake and alert. He is in no distress now. He says he does have significant shortness of breath when he moves but as long as he is still he does okay. His chest shows diminished breath sounds bilaterally but more on the right. His heart is regular. His abdomen is soft.. Extremities showed no edema and central nervous system examination is grossly intact  Lab Results: Basic Metabolic Panel:  Recent Labs  05/03/15 1814  NA 134*  K 4.4  CL 98*  CO2 28  GLUCOSE 93  BUN 12  CREATININE 0.81  CALCIUM 8.9   Liver Function Tests:  Recent Labs  05/03/15 1814  AST 17  ALT 32  ALKPHOS 104  BILITOT 0.2*  PROT 6.7  ALBUMIN 2.5*   No results for input(s): LIPASE, AMYLASE in the last 72 hours. No results for input(s): AMMONIA in the last 72 hours. CBC:  Recent Labs  05/03/15 1814  WBC 14.2*  NEUTROABS 9.8*  HGB 11.6*  HCT 34.7*  MCV 87.0  PLT 726*   Cardiac Enzymes: No results for input(s): CKTOTAL, CKMB, CKMBINDEX, TROPONINI in the last 72 hours. BNP: No results for input(s): PROBNP in the last 72 hours. D-Dimer: No results for input(s):  DDIMER in the last 72 hours. CBG: No results for input(s): GLUCAP in the last 72 hours. Hemoglobin A1C: No results for input(s): HGBA1C in the last 72 hours. Fasting Lipid Panel: No results for input(s): CHOL, HDL, LDLCALC, TRIG, CHOLHDL, LDLDIRECT in the last 72 hours. Thyroid Function Tests: No results for input(s): TSH, T4TOTAL, FREET4, T3FREE, THYROIDAB in the last 72 hours. Anemia Panel: No results for input(s): VITAMINB12, FOLATE, FERRITIN, TIBC, IRON, RETICCTPCT in the last 72 hours. Coagulation: No results for input(s): LABPROT, INR in the last 72 hours. Urine Drug Screen: Drugs of Abuse  No results found for: LABOPIA, COCAINSCRNUR, LABBENZ, AMPHETMU, THCU, LABBARB  Alcohol Level: No results for  input(s): ETH in the last 72 hours. Urinalysis: No results for input(s): COLORURINE, LABSPEC, PHURINE, GLUCOSEU, HGBUR, BILIRUBINUR, KETONESUR, PROTEINUR, UROBILINOGEN, NITRITE, LEUKOCYTESUR in the last 72 hours.  Invalid input(s): APPERANCEUR Misc. Labs:   ABGS: No results for input(s): PHART, PO2ART, TCO2, HCO3 in the last 72 hours.  Invalid input(s): PCO2   MICROBIOLOGY: Recent Results (from the past 240 hour(s))  Culture, respiratory (NON-Expectorated)     Status: None   Collection Time: 04/27/15  9:25 AM  Result Value Ref Range Status   Specimen Description BRONCHIAL ALVEOLAR LAVAGE  Final   Special Requests Normal  Final   Gram Stain   Final    FEW WBC PRESENT,BOTH PMN AND MONONUCLEAR NO SQUAMOUS EPITHELIAL CELLS SEEN NO ORGANISMS SEEN Performed at [facility]    Culture   Final    NO GROWTH 2 DAYS Performed at [facility]    Report Status 04/29/2015 FINAL  Final  Fungus Culture with Smear     Status: None (Preliminary result)   Collection Time: 04/27/15  9:25 AM  Result Value Ref Range Status   Specimen Description BRONCHIAL ALVEOLAR LAVAGE  Final   Special Requests Normal  Final   Fungal Smear   Final    NO YEAST OR FUNGAL ELEMENTS SEEN Performed at [facility]    Culture   Final    CULTURE IN PROGRESS FOR FOUR WEEKS Performed at [facility]    Report Status PENDING  Incomplete  AFB culture with smear     Status: None (Preliminary result)   Collection Time: 04/27/15  9:25 AM  Result Value Ref Range Status   Specimen Description BRONCHIAL ALVEOLAR LAVAGE  Final   Special Requests Normal  Final   Acid Fast Smear   Final    NO ACID FAST BACILLI SEEN Performed at [facility]    Culture   Final    CULTURE WILL BE EXAMINED FOR 6 WEEKS BEFORE ISSUING A FINAL REPORT Performed at [facility]    Report Status PENDING  Incomplete    Studies/Results: Dg Chest 2 View  05/03/2015  CLINICAL DATA:   Shortness of breath, mild chest pain, had fluid drawn from RIGHT hemi thorax last week EXAM: CHEST  2 VIEW COMPARISON:  04/27/2015 FINDINGS: Subtotal opacification of the RIGHT hemi thorax by a combination of fluid and increased lung atelectasis versus consolidation. Slight mediastinal shift to the LEFT. Stable heart size and pulmonary vascularity. Underlying emphysematous changes with linear subsegmental atelectasis versus scarring in LEFT upper lobe. Questionable mild LEFT perihilar infiltrate. No LEFT pleural effusion or pneumothorax. IMPRESSION: Increased opacification of the RIGHT hemi thorax by combination of pleural effusion and increased RIGHT lung atelectasis versus consolidation. COPD changes with atelectasis versus scarring in LEFT upper lobe and question mild LEFT perihilar infiltrate. Electronically  Signed   By: Lavonia Dana M.D.   On: 05/03/2015 17:56   Ct Chest W Contrast  05/03/2015  CLINICAL DATA:  Chest pain and shortness of breath for couple weeks, recent CT chest with RIGHT pleural effusion, underwent thoracentesis, re-accumulation of fluid in increased infiltrate, history COPD ; cytology positive for malignant effusion though uncertain as to whether pulmonary or GI source EXAM: CT CHEST WITH CONTRAST TECHNIQUE: Multidetector CT imaging of the chest was performed during intravenous contrast administration. Sagittal and coronal MPR images reconstructed from axial data set. CONTRAST:  61m OMNIPAQUE IOHEXOL 300 MG/ML  SOLN IV COMPARISON:  04/22/2015; correlation chest radiograph 05/03/2015 FINDINGS: Mild scattered atherosclerotic calcification aorta and coronary arteries. Persistent large at least partially loculated RIGHT pleural effusion with pleural thickening and scattered enhancement consistent with malignant effusion. Multiple normal sized lymph nodes at RIGHT cardiophrenic angle. Complete atelectasis of RIGHT middle and RIGHT lower lobes. Extensive opacity within LEFT upper lobe without  significant volume loss ; this may represent diffuse infiltrate though tumor is not excluded. Abnormal soft tissue at RIGHT hilum suspicious for adenopathy, node 17 mm short axis image 30. Enlarged precarinal lymph node 16 mm short axis image 29. Loculated fluid collection at RIGHT apex. Scattered upper normal sized LEFT hilar nodes. Visualized upper abdomen unremarkable. Large bulla LEFT apex 12.2 cm diameter. Again identified multiple subpleural nodular foci in LEFT lung question tumor versus infiltrate, largest 12 mm diameter. Scattered areas of minimal infiltrate in the LEFT upper and LEFT lower lobes are slightly increased since previous exam. No LEFT pleural effusion or pneumothorax. Degenerative disc disease changes lower cervical and mid thoracic spine. No definite osseous metastatic lesions. IMPRESSION: Persistent large at least partially loculated RIGHT pleural effusion with scattered areas of pleural thickening and enhancement consistent with malignant pleural effusion as identified by prior thoracentesis and cytology. Complete atelectasis of RIGHT middle and RIGHT lower lobes. Subtotal opacification of the RIGHT upper lobe may represent diffuse consolidation or tumor. Mediastinal and suspected RIGHT hilar adenopathy with upper normal sized LEFT hilar nodes. Minimal patchy infiltrate LEFT lung increased since previous exam including several subpleural nodular foci in the LEFT upper and LEFT lower lobes, question infection versus tumor. 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 Electronically Signed   By: MLavonia DanaM.D.   On: 05/03/2015 18:59    Medications:  Prior to Admission:  No prescriptions prior to admission   Scheduled: . heparin  5,000 Units Subcutaneous 3 times per day   Continuous: . dextrose 5 % and 0.9% NaCl 50 mL/hr (05/04/15 0009)   PYIF:OYDXAJOINOMVE**OR** acetaminophen, ondansetron **OR** ondansetron (ZOFRAN) IV, oxyCODONE-acetaminophen  Assesment: He  has lung cancer which is thought to be adenocarcinoma based on cytology. He has a recurrent right pleural effusion which of course is likely related to his cancer.  He has COPD. Active Problems:   Pleural effusion on right   Malnutrition of moderate degree   COPD exacerbation (HCC)   Tobacco use disorder   S/P thoracentesis   Cancer (HCC)   Shortness of breath    Plan: Thoracentesis and send the fluid for cytology      Demone Lyles L 05/04/2015, 8:38 AM

## 2015-05-04 NOTE — Progress Notes (Signed)
Subjective: Patient was admitted due to shortness of breath. His chest x-ray showed re accumulation of pleural effusion.  Objective: Vital signs in last 24 hours: Temp:  [98 F (36.7 C)-98.4 F (36.9 C)] 98.1 F (36.7 C) (10/19 1426) Pulse Rate:  [80-108] 91 (10/19 1426) Resp:  [17-32] 20 (10/19 1426) BP: (98-158)/(52-85) 111/73 mmHg (10/19 1426) SpO2:  [93 %-100 %] 96 % (10/19 1426) Weight:  [60.011 kg (132 lb 4.8 oz)] 60.011 kg (132 lb 4.8 oz) (10/18 2228) Weight change:  Last BM Date: 05/03/15  Intake/Output from previous day: 10/18 0701 - 10/19 0700 In: 340.8 [I.V.:340.8] Out: -   PHYSICAL EXAM General appearance: alert and mild distress Resp: diminished breath sounds bilaterally and rhonchi bilaterally Cardio: S1, S2 normal GI: soft, non-tender; bowel sounds normal; no masses,  no organomegaly Extremities: extremities normal, atraumatic, no cyanosis or edema  Lab Results:  Results for orders placed or performed during the hospital encounter of 05/03/15 (from the past 48 hour(s))  CBC with Differential/Platelet     Status: Abnormal   Collection Time: 05/03/15  6:14 PM  Result Value Ref Range   WBC 14.2 (H) 4.0 - 10.5 K/uL   RBC 3.99 (L) 4.22 - 5.81 MIL/uL   Hemoglobin 11.6 (L) 13.0 - 17.0 g/dL   HCT 99.0 (L) 20.5 - 79.3 %   MCV 87.0 78.0 - 100.0 fL   MCH 29.1 26.0 - 34.0 pg   MCHC 33.4 30.0 - 36.0 g/dL   RDW 41.6 10.6 - 61.6 %   Platelets 726 (H) 150 - 400 K/uL   Neutrophils Relative % 69 %   Neutro Abs 9.8 (H) 1.7 - 7.7 K/uL   Lymphocytes Relative 14 %   Lymphs Abs 2.0 0.7 - 4.0 K/uL   Monocytes Relative 10 %   Monocytes Absolute 1.5 (H) 0.1 - 1.0 K/uL   Eosinophils Relative 6 %   Eosinophils Absolute 0.8 (H) 0.0 - 0.7 K/uL   Basophils Relative 1 %   Basophils Absolute 0.1 0.0 - 0.1 K/uL  Comprehensive metabolic panel     Status: Abnormal   Collection Time: 05/03/15  6:14 PM  Result Value Ref Range   Sodium 134 (L) 135 - 145 mmol/L   Potassium 4.4 3.5 -  5.1 mmol/L   Chloride 98 (L) 101 - 111 mmol/L   CO2 28 22 - 32 mmol/L   Glucose, Bld 93 65 - 99 mg/dL   BUN 12 6 - 20 mg/dL   Creatinine, Ser 8.15 0.61 - 1.24 mg/dL   Calcium 8.9 8.9 - 81.0 mg/dL   Total Protein 6.7 6.5 - 8.1 g/dL   Albumin 2.5 (L) 3.5 - 5.0 g/dL   AST 17 15 - 41 U/L   ALT 32 17 - 63 U/L   Alkaline Phosphatase 104 38 - 126 U/L   Total Bilirubin 0.2 (L) 0.3 - 1.2 mg/dL   GFR calc non Af Amer >60 >60 mL/min   GFR calc Af Amer >60 >60 mL/min    Comment: (NOTE) The eGFR has been calculated using the CKD EPI equation. This calculation has not been validated in all clinical situations. eGFR's persistently <60 mL/min signify possible Chronic Kidney Disease.    Anion gap 8 5 - 15    ABGS No results for input(s): PHART, PO2ART, TCO2, HCO3 in the last 72 hours.  Invalid input(s): PCO2 CULTURES Recent Results (from the past 240 hour(s))  Culture, respiratory (NON-Expectorated)     Status: None   Collection Time: 04/27/15  9:25 AM  Result Value Ref Range Status   Specimen Description BRONCHIAL ALVEOLAR LAVAGE  Final   Special Requests Normal  Final   Gram Stain   Final    FEW WBC PRESENT,BOTH PMN AND MONONUCLEAR NO SQUAMOUS EPITHELIAL CELLS SEEN NO ORGANISMS SEEN Performed at [facility]    Culture   Final    NO GROWTH 2 DAYS Performed at [facility]    Report Status 04/29/2015 FINAL  Final  Fungus Culture with Smear     Status: None (Preliminary result)   Collection Time: 04/27/15  9:25 AM  Result Value Ref Range Status   Specimen Description BRONCHIAL ALVEOLAR LAVAGE  Final   Special Requests Normal  Final   Fungal Smear   Final    NO YEAST OR FUNGAL ELEMENTS SEEN Performed at [facility]    Culture   Final    CULTURE IN PROGRESS FOR FOUR WEEKS Performed at [facility]    Report Status PENDING  Incomplete  AFB culture with smear     Status: None (Preliminary result)   Collection Time: 04/27/15  9:25 AM  Result  Value Ref Range Status   Specimen Description BRONCHIAL ALVEOLAR LAVAGE  Final   Special Requests Normal  Final   Acid Fast Smear   Final    NO ACID FAST BACILLI SEEN Performed at [facility]    Culture   Final    CULTURE WILL BE EXAMINED FOR 6 WEEKS BEFORE ISSUING A FINAL REPORT Performed at [facility]    Report Status PENDING  Incomplete   Studies/Results: Dg Chest 1 View  05/04/2015  CLINICAL DATA:  Post right thoracentesis EXAM: CHEST 1 VIEW COMPARISON:  05/03/2015 FINDINGS: Decreasing right effusion. Moderate partially loculated right effusion persists with diffuse right lung airspace disease. No pneumothorax. Underlying COPD. Linear scarring or atelectasis in the left upper lobe. Heart is normal size. IMPRESSION: Decreasing right effusion with moderate partially loculated right effusion persisting. No pneumothorax. Diffuse right lung airspace disease. COPD. Electronically Signed   By: Rolm Baptise M.D.   On: 05/04/2015 12:30   Dg Chest 2 View  05/03/2015  CLINICAL DATA:  Shortness of breath, mild chest pain, had fluid drawn from RIGHT hemi thorax last week EXAM: CHEST  2 VIEW COMPARISON:  04/27/2015 FINDINGS: Subtotal opacification of the RIGHT hemi thorax by a combination of fluid and increased lung atelectasis versus consolidation. Slight mediastinal shift to the LEFT. Stable heart size and pulmonary vascularity. Underlying emphysematous changes with linear subsegmental atelectasis versus scarring in LEFT upper lobe. Questionable mild LEFT perihilar infiltrate. No LEFT pleural effusion or pneumothorax. IMPRESSION: Increased opacification of the RIGHT hemi thorax by combination of pleural effusion and increased RIGHT lung atelectasis versus consolidation. COPD changes with atelectasis versus scarring in LEFT upper lobe and question mild LEFT perihilar infiltrate. Electronically Signed   By: Lavonia Dana M.D.   On: 05/03/2015 17:56   Ct Chest W Contrast  05/03/2015   CLINICAL DATA:  Chest pain and shortness of breath for couple weeks, recent CT chest with RIGHT pleural effusion, underwent thoracentesis, re-accumulation of fluid in increased infiltrate, history COPD ; cytology positive for malignant effusion though uncertain as to whether pulmonary or GI source EXAM: CT CHEST WITH CONTRAST TECHNIQUE: Multidetector CT imaging of the chest was performed during intravenous contrast administration. Sagittal and coronal MPR images reconstructed from axial data set. CONTRAST:  98mL OMNIPAQUE IOHEXOL 300 MG/ML  SOLN IV COMPARISON:  04/22/2015; correlation chest  radiograph 05/03/2015 FINDINGS: Mild scattered atherosclerotic calcification aorta and coronary arteries. Persistent large at least partially loculated RIGHT pleural effusion with pleural thickening and scattered enhancement consistent with malignant effusion. Multiple normal sized lymph nodes at RIGHT cardiophrenic angle. Complete atelectasis of RIGHT middle and RIGHT lower lobes. Extensive opacity within LEFT upper lobe without significant volume loss ; this may represent diffuse infiltrate though tumor is not excluded. Abnormal soft tissue at RIGHT hilum suspicious for adenopathy, node 17 mm short axis image 30. Enlarged precarinal lymph node 16 mm short axis image 29. Loculated fluid collection at RIGHT apex. Scattered upper normal sized LEFT hilar nodes. Visualized upper abdomen unremarkable. Large bulla LEFT apex 12.2 cm diameter. Again identified multiple subpleural nodular foci in LEFT lung question tumor versus infiltrate, largest 12 mm diameter. Scattered areas of minimal infiltrate in the LEFT upper and LEFT lower lobes are slightly increased since previous exam. No LEFT pleural effusion or pneumothorax. Degenerative disc disease changes lower cervical and mid thoracic spine. No definite osseous metastatic lesions. IMPRESSION: Persistent large at least partially loculated RIGHT pleural effusion with scattered areas of  pleural thickening and enhancement consistent with malignant pleural effusion as identified by prior thoracentesis and cytology. Complete atelectasis of RIGHT middle and RIGHT lower lobes. Subtotal opacification of the RIGHT upper lobe may represent diffuse consolidation or tumor. Mediastinal and suspected RIGHT hilar adenopathy with upper normal sized LEFT hilar nodes. Minimal patchy infiltrate LEFT lung increased since previous exam including several subpleural nodular foci in the LEFT upper and LEFT lower lobes, question infection versus tumor. 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 Electronically Signed   By: Lavonia Dana M.D.   On: 05/03/2015 18:59   US Thoracentesis Asp Pleural Space W/img Guide  05/04/2015  CLINICAL DATA:  Right pleural effusion EXAM: ULTRASOUND GUIDED RIGHT THORACENTESIS COMPARISON:  Chest CT 05/03/2015 PROCEDURE: An ultrasound guided thoracentesis was thoroughly discussed with the patient and questions answered. The benefits, risks, alternatives and complications were also discussed. The patient understands and wishes to proceed with the procedure. Written consent was obtained. Ultrasound was performed to localize and mark an adequate pocket of fluid in the right posterior chest. The area was then prepped and draped in the normal sterile fashion. 1% Lidocaine was used for local anesthesia. Under ultrasound guidance a 19 gauge Yueh catheter was introduced. Thoracentesis was performed. The catheter was removed and a dressing applied. COMPLICATIONS: None. FINDINGS: A total of approximately 1.4 L of bloody fluid was removed. A fluid sample wassent for laboratory analysis. IMPRESSION: Successful ultrasound guided right thoracentesis yielding 1.4 L of pleural fluid. Electronically Signed   By: Rolm Baptise M.D.   On: 05/04/2015 12:31    Medications: I have reviewed the patient's current medications.  Assesment:   Active Problems:   Pleural effusion on right    Malnutrition of moderate degree   COPD exacerbation (HCC)   Tobacco use disorder   S/P thoracentesis   Cancer (HCC)   Shortness of breath    Plan:  Medications reviewed Pulmonary consult appreciated Continue current treatment      Lang Zingg 05/04/2015, 8:31 PM

## 2015-05-04 NOTE — Progress Notes (Signed)
Thoracentesis complete no signs of distress. 1400 ml dark red colored pleural fluid removed.

## 2015-05-05 ENCOUNTER — Observation Stay (HOSPITAL_COMMUNITY): Payer: Medicaid Other

## 2015-05-05 ENCOUNTER — Encounter (HOSPITAL_COMMUNITY): Payer: Self-pay | Admitting: Oncology

## 2015-05-05 ENCOUNTER — Encounter (HOSPITAL_COMMUNITY): Payer: Medicaid Other

## 2015-05-05 ENCOUNTER — Encounter (HOSPITAL_COMMUNITY): Payer: Self-pay | Admitting: *Deleted

## 2015-05-05 ENCOUNTER — Encounter (HOSPITAL_COMMUNITY): Payer: Medicaid Other | Admitting: Hematology & Oncology

## 2015-05-05 DIAGNOSIS — F1910 Other psychoactive substance abuse, uncomplicated: Secondary | ICD-10-CM | POA: Insufficient documentation

## 2015-05-05 DIAGNOSIS — Z681 Body mass index (BMI) 19 or less, adult: Secondary | ICD-10-CM | POA: Diagnosis not present

## 2015-05-05 DIAGNOSIS — Z833 Family history of diabetes mellitus: Secondary | ICD-10-CM | POA: Diagnosis not present

## 2015-05-05 DIAGNOSIS — J90 Pleural effusion, not elsewhere classified: Secondary | ICD-10-CM | POA: Diagnosis not present

## 2015-05-05 DIAGNOSIS — F1010 Alcohol abuse, uncomplicated: Secondary | ICD-10-CM | POA: Diagnosis present

## 2015-05-05 DIAGNOSIS — C801 Malignant (primary) neoplasm, unspecified: Secondary | ICD-10-CM | POA: Diagnosis not present

## 2015-05-05 DIAGNOSIS — D72829 Elevated white blood cell count, unspecified: Secondary | ICD-10-CM | POA: Diagnosis not present

## 2015-05-05 DIAGNOSIS — C3491 Malignant neoplasm of unspecified part of right bronchus or lung: Secondary | ICD-10-CM | POA: Diagnosis present

## 2015-05-05 DIAGNOSIS — D696 Thrombocytopenia, unspecified: Secondary | ICD-10-CM | POA: Diagnosis present

## 2015-05-05 DIAGNOSIS — J910 Malignant pleural effusion: Secondary | ICD-10-CM | POA: Diagnosis present

## 2015-05-05 DIAGNOSIS — J441 Chronic obstructive pulmonary disease with (acute) exacerbation: Secondary | ICD-10-CM | POA: Diagnosis present

## 2015-05-05 DIAGNOSIS — D649 Anemia, unspecified: Secondary | ICD-10-CM | POA: Diagnosis present

## 2015-05-05 DIAGNOSIS — K449 Diaphragmatic hernia without obstruction or gangrene: Secondary | ICD-10-CM | POA: Diagnosis present

## 2015-05-05 DIAGNOSIS — F1410 Cocaine abuse, uncomplicated: Secondary | ICD-10-CM | POA: Diagnosis present

## 2015-05-05 DIAGNOSIS — R0602 Shortness of breath: Secondary | ICD-10-CM | POA: Diagnosis present

## 2015-05-05 DIAGNOSIS — E440 Moderate protein-calorie malnutrition: Secondary | ICD-10-CM | POA: Diagnosis present

## 2015-05-05 DIAGNOSIS — F17210 Nicotine dependence, cigarettes, uncomplicated: Secondary | ICD-10-CM | POA: Diagnosis present

## 2015-05-05 DIAGNOSIS — F1210 Cannabis abuse, uncomplicated: Secondary | ICD-10-CM | POA: Diagnosis present

## 2015-05-05 LAB — CBC WITH DIFFERENTIAL/PLATELET
Basophils Absolute: 0.1 10*3/uL (ref 0.0–0.1)
Basophils Relative: 1 %
EOS ABS: 0.9 10*3/uL — AB (ref 0.0–0.7)
EOS PCT: 6 %
HCT: 30.7 % — ABNORMAL LOW (ref 39.0–52.0)
Hemoglobin: 10.1 g/dL — ABNORMAL LOW (ref 13.0–17.0)
LYMPHS ABS: 1.8 10*3/uL (ref 0.7–4.0)
Lymphocytes Relative: 13 %
MCH: 28.9 pg (ref 26.0–34.0)
MCHC: 32.9 g/dL (ref 30.0–36.0)
MCV: 88 fL (ref 78.0–100.0)
MONO ABS: 1.2 10*3/uL — AB (ref 0.1–1.0)
MONOS PCT: 8 %
Neutro Abs: 9.8 10*3/uL — ABNORMAL HIGH (ref 1.7–7.7)
Neutrophils Relative %: 72 %
PLATELETS: 735 10*3/uL — AB (ref 150–400)
RBC: 3.49 MIL/uL — AB (ref 4.22–5.81)
RDW: 14.5 % (ref 11.5–15.5)
WBC: 13.7 10*3/uL — AB (ref 4.0–10.5)

## 2015-05-05 LAB — COMPREHENSIVE METABOLIC PANEL
ALK PHOS: 98 U/L (ref 38–126)
ALT: 25 U/L (ref 17–63)
ANION GAP: 7 (ref 5–15)
AST: 19 U/L (ref 15–41)
Albumin: 2.2 g/dL — ABNORMAL LOW (ref 3.5–5.0)
BILIRUBIN TOTAL: 0.2 mg/dL — AB (ref 0.3–1.2)
BUN: 8 mg/dL (ref 6–20)
CALCIUM: 8.6 mg/dL — AB (ref 8.9–10.3)
CO2: 29 mmol/L (ref 22–32)
Chloride: 97 mmol/L — ABNORMAL LOW (ref 101–111)
Creatinine, Ser: 0.72 mg/dL (ref 0.61–1.24)
Glucose, Bld: 125 mg/dL — ABNORMAL HIGH (ref 65–99)
Potassium: 3.9 mmol/L (ref 3.5–5.1)
Sodium: 133 mmol/L — ABNORMAL LOW (ref 135–145)
Total Protein: 5.9 g/dL — ABNORMAL LOW (ref 6.5–8.1)

## 2015-05-05 LAB — RETICULOCYTES
RBC.: 3.49 MIL/uL — ABNORMAL LOW (ref 4.22–5.81)
RETIC COUNT ABSOLUTE: 34.9 10*3/uL (ref 19.0–186.0)
Retic Ct Pct: 1 % (ref 0.4–3.1)

## 2015-05-05 LAB — VITAMIN B12: VITAMIN B 12: 626 pg/mL (ref 180–914)

## 2015-05-05 LAB — FERRITIN: FERRITIN: 120 ng/mL (ref 24–336)

## 2015-05-05 LAB — IRON AND TIBC
IRON: 19 ug/dL — AB (ref 45–182)
Saturation Ratios: 9 % — ABNORMAL LOW (ref 17.9–39.5)
TIBC: 221 ug/dL — ABNORMAL LOW (ref 250–450)
UIBC: 202 ug/dL

## 2015-05-05 LAB — FOLATE: FOLATE: 21.9 ng/mL (ref >5.9)

## 2015-05-05 MED ORDER — CHLORHEXIDINE GLUCONATE 4 % EX LIQD: 1.0000 "application " | Freq: Once | CUTANEOUS | Status: DC

## 2015-05-05 MED ORDER — TECHNETIUM TC 99M MEDRONATE IV KIT
25.0000 | PACK | Freq: Once | INTRAVENOUS | Status: AC | PRN
  Administered 2015-05-05: 27 via INTRAVENOUS

## 2015-05-05 MED ORDER — CEFAZOLIN SODIUM-DEXTROSE 2-3 GM-% IV SOLR: 2.0000 g | INTRAVENOUS | Status: DC

## 2015-05-05 MED ORDER — PEG 3350-KCL-NA BICARB-NACL 420 G PO SOLR
2000.0000 mL | Freq: Once | ORAL | Status: AC
  Administered 2015-05-06: 2000 mL via ORAL

## 2015-05-05 MED ORDER — PEG 3350-KCL-NA BICARB-NACL 420 G PO SOLR
2000.0000 mL | Freq: Once | ORAL | Status: AC
  Administered 2015-05-05: 2000 mL via ORAL
  Filled 2015-05-05: qty 4000

## 2015-05-05 NOTE — Progress Notes (Unsigned)
[facility] received paperwork from [place] per Genie.

## 2015-05-05 NOTE — Progress Notes (Signed)
He had thoracentesis yesterday and of course the cytology is still pending. I don't think there is anything really to add from a pulmonary point of view at this time.

## 2015-05-05 NOTE — Consult Note (Signed)
Referring Provider: No ref. provider found Primary Care Physician:  Brian Nelson Primary Gastroenterologist:  Dr. Gala Nelson  Date of Admission: 05/03/15 Date of Consultation: 05/05/15  Reason for Consultation:  Possible colonoscopy  HPI:  54 year old male with newly diagnosed lung cancer determined to be adenocarcinoma currently seeing Dr. Whitney Nelson. She was recommended for urgent colonoscopy to determine if she has colon cancer. She called our office and was planned for new patient visit pending PCP referral. She was since admitted for dyspnea. She was found to have a worsening pleural effusion. Had thoracentesis 05/04/15 with 1.4 L of bloody fluid removed and cytology is pending. Referral requested to determine if he is a candidate for inpatient colonoscopy for further evaluation of his cancer status.   Today he states he is asymptomatic from a GI standpoint. Has not had any abdominal pain, N/V, hematochezia, melena, fever, chills, unintentional weight loss, GERD symptoms. His breathing is better today after thoracentesis. Has never had a colonoscopy or endoscopy and has never seen a GI provider.  Past Medical History  Diagnosis Date  . Tobacco use disorder 04/23/2015    Quit Fall 2016  . ETOH abuse     Quit Fall 2016  . Illicit drug use     Quit Fall 2016    Past Surgical History  Procedure Laterality Date  . Video bronchoscopy Bilateral 04/27/2015    Procedure: VIDEO BRONCHOSCOPY WITH FLUORO;  Surgeon: Juanito Doom, MD;  Location: [facility];  Service: Cardiopulmonary;  Laterality: Bilateral;    Prior to Admission medications   Not on File    Current Facility-Administered Medications  Medication Dose Route Frequency Provider Last Rate Last Dose  . acetaminophen (TYLENOL) tablet 650 mg  650 mg Oral Q6H PRN Orvan Falconer, MD       Or  . acetaminophen (TYLENOL) suppository 650 mg  650 mg Rectal Q6H PRN Orvan Falconer, MD      . dextrose 5 %-0.9 % sodium  chloride infusion   Intravenous Continuous Orvan Falconer, MD 50 mL/hr at 05/04/15 2126 50 mL/hr at 05/04/15 2126  . heparin injection 5,000 Units  5,000 Units Subcutaneous 3 times per day Orvan Falconer, MD   5,000 Units at 05/05/15 0618  . ondansetron (ZOFRAN) tablet 4 mg  4 mg Oral Q6H PRN Orvan Falconer, MD       Or  . ondansetron [facility]) injection 4 mg  4 mg Intravenous Q6H PRN Orvan Falconer, MD      . oxyCODONE-acetaminophen (PERCOCET/ROXICET) 5-325 MG per tablet 1-2 tablet  1-2 tablet Oral Q4H PRN Orvan Falconer, MD   2 tablet at 05/05/15 0101    Allergies as of 05/03/2015  . (No Known Allergies)    Family History  Problem Relation Age of Onset  . Diabetes Mother     Social History   Social History  . Marital Status: Married    Spouse Name: N/A  . Number of Children: N/A  . Years of Education: N/A   Occupational History  . Concrete work    Social History Main Topics  . Smoking status: Former Smoker -- 2.00 packs/day for 40 years    Types: Cigarettes    Quit date: 04/22/2015  . Smokeless tobacco: Never Used  . Alcohol Use: 3.6 oz/week    6 Cans of beer per week     Comment: Quit in fall 2016  . Drug Use: Yes    Special: Cocaine, Marijuana     Comment: Quit in  Fall 2016  . Sexual Activity: Not on file   Other Topics Concern  . Not on file   Social History Narrative    Review of Systems: Gen: Denies fever, chills, loss of appetite, change in weight or weight loss CV: Denies chest pain, heart palpitations, syncope, edema  Resp: Shortness of breath improved today s/p thoracentesis. Denies cough, wheezing GI: See HPI.  Psych: Denies depression, anxiety,confusion, or memory loss Heme: Denies bruising, bleeding.  Physical Exam: Vital signs in last 24 hours: Temp:  [98 F (36.7 C)-98.1 F (36.7 C)] 98 F (36.7 C) (10/20 0557) Pulse Rate:  [74-98] 74 (10/20 0557) Resp:  [20] 20 (10/20 0557) BP: (98-131)/(52-85) 131/70 mmHg (10/20 0557) SpO2:  [96 %-100 %] 100 % (10/20 0557) Last BM  Date: 05/03/15 General:   Alert,  Well-developed, well-nourished, pleasant and cooperative in NAD Head:  Normocephalic and atraumatic. Eyes:  Sclera clear, no icterus. Conjunctiva pink. Ears:  Normal auditory acuity. Lungs: Diminished throughout. No acute distress. Heart:  Regular rate and rhythm; no murmurs, clicks, rubs,  or gallops. Abdomen:  Soft, nontender and nondistended. No masses, hepatosplenomegaly or hernias noted. Normal bowel sounds, without guarding, and without rebound.   Rectal:  Deferred.   Pulses:  Normal DP pulses noted. Extremities:  Without clubbing or edema. Neurologic:  Alert and  oriented x4;  grossly normal neurologically. Psych:  Alert and cooperative. Normal mood and affect.  Intake/Output from previous day: 10/19 0701 - 10/20 0700 In: 1860.8 [P.O.:720; I.V.:1140.8] Out: -  Intake/Output this shift:    Lab Results:  Recent Labs  05/03/15 1814  WBC 14.2*  HGB 11.6*  HCT 34.7*  PLT 726*   BMET  Recent Labs  05/03/15 1814  NA 134*  K 4.4  CL 98*  CO2 28  GLUCOSE 93  BUN 12  CREATININE 0.81  CALCIUM 8.9   LFT  Recent Labs  05/03/15 1814  PROT 6.7  ALBUMIN 2.5*  AST 17  ALT 32  ALKPHOS 104  BILITOT 0.2*   PT/INR No results for input(s): LABPROT, INR in the last 72 hours. Hepatitis Panel No results for input(s): HEPBSAG, HCVAB, HEPAIGM, HEPBIGM in the last 72 hours. C-Diff No results for input(s): CDIFFTOX in the last 72 hours.  Studies/Results: Dg Chest 1 View  05/04/2015  CLINICAL DATA:  Post right thoracentesis EXAM: CHEST 1 VIEW COMPARISON:  05/03/2015 FINDINGS: Decreasing right effusion. Moderate partially loculated right effusion persists with diffuse right lung airspace disease. No pneumothorax. Underlying COPD. Linear scarring or atelectasis in the left upper lobe. Heart is normal size. IMPRESSION: Decreasing right effusion with moderate partially loculated right effusion persisting. No pneumothorax. Diffuse right lung  airspace disease. COPD. Electronically Signed   By: Rolm Baptise M.D.   On: 05/04/2015 12:30   Dg Chest 2 View  05/03/2015  CLINICAL DATA:  Shortness of breath, mild chest pain, had fluid drawn from RIGHT hemi thorax last week EXAM: CHEST  2 VIEW COMPARISON:  04/27/2015 FINDINGS: Subtotal opacification of the RIGHT hemi thorax by a combination of fluid and increased lung atelectasis versus consolidation. Slight mediastinal shift to the LEFT. Stable heart size and pulmonary vascularity. Underlying emphysematous changes with linear subsegmental atelectasis versus scarring in LEFT upper lobe. Questionable mild LEFT perihilar infiltrate. No LEFT pleural effusion or pneumothorax. IMPRESSION: Increased opacification of the RIGHT hemi thorax by combination of pleural effusion and increased RIGHT lung atelectasis versus consolidation. COPD changes with atelectasis versus scarring in LEFT upper lobe and question mild LEFT perihilar  infiltrate. Electronically Signed   By: Lavonia Dana M.D.   On: 05/03/2015 17:56   Ct Chest W Contrast  05/03/2015  CLINICAL DATA:  Chest pain and shortness of breath for couple weeks, recent CT chest with RIGHT pleural effusion, underwent thoracentesis, re-accumulation of fluid in increased infiltrate, history COPD ; cytology positive for malignant effusion though uncertain as to whether pulmonary or GI source EXAM: CT CHEST WITH CONTRAST TECHNIQUE: Multidetector CT imaging of the chest was performed during intravenous contrast administration. Sagittal and coronal MPR images reconstructed from axial data set. CONTRAST:  65m OMNIPAQUE IOHEXOL 300 MG/ML  SOLN IV COMPARISON:  04/22/2015; correlation chest radiograph 05/03/2015 FINDINGS: Mild scattered atherosclerotic calcification aorta and coronary arteries. Persistent large at least partially loculated RIGHT pleural effusion with pleural thickening and scattered enhancement consistent with malignant effusion. Multiple normal sized lymph  nodes at RIGHT cardiophrenic angle. Complete atelectasis of RIGHT middle and RIGHT lower lobes. Extensive opacity within LEFT upper lobe without significant volume loss ; this may represent diffuse infiltrate though tumor is not excluded. Abnormal soft tissue at RIGHT hilum suspicious for adenopathy, node 17 mm short axis image 30. Enlarged precarinal lymph node 16 mm short axis image 29. Loculated fluid collection at RIGHT apex. Scattered upper normal sized LEFT hilar nodes. Visualized upper abdomen unremarkable. Large bulla LEFT apex 12.2 cm diameter. Again identified multiple subpleural nodular foci in LEFT lung question tumor versus infiltrate, largest 12 mm diameter. Scattered areas of minimal infiltrate in the LEFT upper and LEFT lower lobes are slightly increased since previous exam. No LEFT pleural effusion or pneumothorax. Degenerative disc disease changes lower cervical and mid thoracic spine. No definite osseous metastatic lesions. IMPRESSION: Persistent large at least partially loculated RIGHT pleural effusion with scattered areas of pleural thickening and enhancement consistent with malignant pleural effusion as identified by prior thoracentesis and cytology. Complete atelectasis of RIGHT middle and RIGHT lower lobes. Subtotal opacification of the RIGHT upper lobe may represent diffuse consolidation or tumor. Mediastinal and suspected RIGHT hilar adenopathy with upper normal sized LEFT hilar nodes. Minimal patchy infiltrate LEFT lung increased since previous exam including several subpleural nodular foci in the LEFT upper and LEFT lower lobes, question infection versus tumor. 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 Electronically Signed   By: MLavonia DanaM.D.   On: 05/03/2015 18:59   UKoreaThoracentesis Asp Pleural Space W/img Guide  05/04/2015  CLINICAL DATA:  Right pleural effusion EXAM: ULTRASOUND GUIDED RIGHT THORACENTESIS COMPARISON:  Chest CT 05/03/2015 PROCEDURE: An  ultrasound guided thoracentesis was thoroughly discussed with the patient and questions answered. The benefits, risks, alternatives and complications were also discussed. The patient understands and wishes to proceed with the procedure. Written consent was obtained. Ultrasound was performed to localize and mark an adequate pocket of fluid in the right posterior chest. The area was then prepped and draped in the normal sterile fashion. 1% Lidocaine was used for local anesthesia. Under ultrasound guidance a 19 gauge Yueh catheter was introduced. Thoracentesis was performed. The catheter was removed and a dressing applied. COMPLICATIONS: None. FINDINGS: A total of approximately 1.4 L of bloody fluid was removed. A fluid sample wassent for laboratory analysis. IMPRESSION: Successful ultrasound guided right thoracentesis yielding 1.4 L of pleural fluid. Electronically Signed   By: KRolm BaptiseM.D.   On: 05/04/2015 12:31    Impression: 54year old male with newly diagnosed lung CA suspicious for metastatic disease per CT imaging. Our office was called as an outpatient to  request urgent colonoscopy to determine if his cancer is primary colon cancer. While awaiting PCP referral, patient was admitted for worsening dyspnea and we have been consulted to determine if it's prudent to complete colonoscopy as inpatient. He is currently asymptomatic from a GI standpoint as noted above. His breathing is improved s/p thoracentesis with 1.4L bloody fluid removed, cytology pending. Has never had a colonoscopy before. Bital signs have been stable, currently satting 96-100% on 2 lpm O2 per nasal cannula.  CT abdomen/pelvis completed 04/27/15: IMPRESSION: Moderate right pleural effusion with loculation and pleural nodularity suggesting malignant effusion. Atelectasis in the right lung base. Ground-glass nodules in the left lung base, largest 1.1 cm. This is likely metastatic although inflammatory nodularity not entirely  excluded. No definite evidence of malignancy demonstrated in the abdomen or pelvis although technical factors limits evaluation of bowel and colon. Bladder wall thickening may be due to cystitis or outlet obstruction.  Plan:  1. Clear liquids today, NPO after midnight 2. Will plan for inpatient colonoscopy tomorrow if he remains stable from a pulmonary standpoint 3. Plan for split prep: 2L [place] tonight, 2L early tomorrow, tap water enema x 2 on call to colonoscopy 4. Will need propofol/MAC due to history of ETOH abuse and drug abuse ("quit in fall 2016")   Brian Nelson, AGNP-C Adult & Gerontological Nurse Practitioner [facility]       05/05/2015, 8:48 AM

## 2015-05-05 NOTE — Progress Notes (Signed)
Call received from short stay and patient is double booked for port placement in the am and colonoscopy in OR later in the day. State they cannot do both on the same day. Called Tom at the cancer center who states they prefer the colonoscopy to be done first. Spoke with Dr. Arnoldo Morale and relayed the message who states he will call day surgery to cancel the port placement scheduled for tomorrow.

## 2015-05-05 NOTE — Progress Notes (Signed)
Subjective: Patient had thoracentesis. He feels better. Cytology result is pending. He is being evaluated by oncology.  Objective: Vital signs in last 24 hours: Temp:  [98 F (36.7 C)-98.1 F (36.7 C)] 98 F (36.7 C) (10/20 0557) Pulse Rate:  [74-98] 74 (10/20 0557) Resp:  [20] 20 (10/20 0557) BP: (98-131)/(52-85) 131/70 mmHg (10/20 0557) SpO2:  [96 %-100 %] 100 % (10/20 0557) Weight change:  Last BM Date: 05/03/15  Intake/Output from previous day: 10/19 0701 - 10/20 0700 In: 1860.8 [P.O.:720; I.V.:1140.8] Out: -   PHYSICAL EXAM General appearance: alert and mild distress Resp: diminished breath sounds bilaterally and rhonchi bilaterally Cardio: S1, S2 normal GI: soft, non-tender; bowel sounds normal; no masses,  no organomegaly Extremities: extremities normal, atraumatic, no cyanosis or edema  Lab Results:  Results for orders placed or performed during the hospital encounter of 05/03/15 (from the past 48 hour(s))  CBC with Differential/Platelet     Status: Abnormal   Collection Time: 05/03/15  6:14 PM  Result Value Ref Range   WBC 14.2 (H) 4.0 - 10.5 K/uL   RBC 3.99 (L) 4.22 - 5.81 MIL/uL   Hemoglobin 11.6 (L) 13.0 - 17.0 g/dL   HCT 34.7 (L) 39.0 - 52.0 %   MCV 87.0 78.0 - 100.0 fL   MCH 29.1 26.0 - 34.0 pg   MCHC 33.4 30.0 - 36.0 g/dL   RDW 14.2 11.5 - 15.5 %   Platelets 726 (H) 150 - 400 K/uL   Neutrophils Relative % 69 %   Neutro Abs 9.8 (H) 1.7 - 7.7 K/uL   Lymphocytes Relative 14 %   Lymphs Abs 2.0 0.7 - 4.0 K/uL   Monocytes Relative 10 %   Monocytes Absolute 1.5 (H) 0.1 - 1.0 K/uL   Eosinophils Relative 6 %   Eosinophils Absolute 0.8 (H) 0.0 - 0.7 K/uL   Basophils Relative 1 %   Basophils Absolute 0.1 0.0 - 0.1 K/uL  Comprehensive metabolic panel     Status: Abnormal   Collection Time: 05/03/15  6:14 PM  Result Value Ref Range   Sodium 134 (L) 135 - 145 mmol/L   Potassium 4.4 3.5 - 5.1 mmol/L   Chloride 98 (L) 101 - 111 mmol/L   CO2 28 22 - 32 mmol/L    Glucose, Bld 93 65 - 99 mg/dL   BUN 12 6 - 20 mg/dL   Creatinine, Ser 0.81 0.61 - 1.24 mg/dL   Calcium 8.9 8.9 - 10.3 mg/dL   Total Protein 6.7 6.5 - 8.1 g/dL   Albumin 2.5 (L) 3.5 - 5.0 g/dL   AST 17 15 - 41 U/L   ALT 32 17 - 63 U/L   Alkaline Phosphatase 104 38 - 126 U/L   Total Bilirubin 0.2 (L) 0.3 - 1.2 mg/dL   GFR calc non Af Amer >60 >60 mL/min   GFR calc Af Amer >60 >60 mL/min    Comment: (NOTE) The eGFR has been calculated using the CKD EPI equation. This calculation has not been validated in all clinical situations. eGFR's persistently <60 mL/min signify possible Chronic Kidney Disease.    Anion gap 8 5 - 15    ABGS No results for input(s): PHART, PO2ART, TCO2, HCO3 in the last 72 hours.  Invalid input(s): PCO2 CULTURES Recent Results (from the past 240 hour(s))  Culture, respiratory (NON-Expectorated)     Status: None   Collection Time: 04/27/15  9:25 AM  Result Value Ref Range Status   Specimen Description BRONCHIAL ALVEOLAR LAVAGE  Final   Special Requests Normal  Final   Gram Stain   Final    FEW WBC PRESENT,BOTH PMN AND MONONUCLEAR NO SQUAMOUS EPITHELIAL CELLS SEEN NO ORGANISMS SEEN Performed at [facility]    Culture   Final    NO GROWTH 2 DAYS Performed at [facility]    Report Status 04/29/2015 FINAL  Final  Fungus Culture with Smear     Status: None (Preliminary result)   Collection Time: 04/27/15  9:25 AM  Result Value Ref Range Status   Specimen Description BRONCHIAL ALVEOLAR LAVAGE  Final   Special Requests Normal  Final   Fungal Smear   Final    NO YEAST OR FUNGAL ELEMENTS SEEN Performed at [facility]    Culture   Final    CULTURE IN PROGRESS FOR FOUR WEEKS Performed at [facility]    Report Status PENDING  Incomplete  AFB culture with smear     Status: None (Preliminary result)   Collection Time: 04/27/15  9:25 AM  Result Value Ref Range Status   Specimen Description BRONCHIAL ALVEOLAR LAVAGE   Final   Special Requests Normal  Final   Acid Fast Smear   Final    NO ACID FAST BACILLI SEEN Performed at [facility]    Culture   Final    CULTURE WILL BE EXAMINED FOR 6 WEEKS BEFORE ISSUING A FINAL REPORT Performed at [facility]    Report Status PENDING  Incomplete   Studies/Results: Dg Chest 1 View  05/04/2015  CLINICAL DATA:  Post right thoracentesis EXAM: CHEST 1 VIEW COMPARISON:  05/03/2015 FINDINGS: Decreasing right effusion. Moderate partially loculated right effusion persists with diffuse right lung airspace disease. No pneumothorax. Underlying COPD. Linear scarring or atelectasis in the left upper lobe. Heart is normal size. IMPRESSION: Decreasing right effusion with moderate partially loculated right effusion persisting. No pneumothorax. Diffuse right lung airspace disease. COPD. Electronically Signed   By: Rolm Baptise M.D.   On: 05/04/2015 12:30   Dg Chest 2 View  05/03/2015  CLINICAL DATA:  Shortness of breath, mild chest pain, had fluid drawn from RIGHT hemi thorax last week EXAM: CHEST  2 VIEW COMPARISON:  04/27/2015 FINDINGS: Subtotal opacification of the RIGHT hemi thorax by a combination of fluid and increased lung atelectasis versus consolidation. Slight mediastinal shift to the LEFT. Stable heart size and pulmonary vascularity. Underlying emphysematous changes with linear subsegmental atelectasis versus scarring in LEFT upper lobe. Questionable mild LEFT perihilar infiltrate. No LEFT pleural effusion or pneumothorax. IMPRESSION: Increased opacification of the RIGHT hemi thorax by combination of pleural effusion and increased RIGHT lung atelectasis versus consolidation. COPD changes with atelectasis versus scarring in LEFT upper lobe and question mild LEFT perihilar infiltrate. Electronically Signed   By: Lavonia Dana M.D.   On: 05/03/2015 17:56   Ct Chest W Contrast  05/03/2015  CLINICAL DATA:  Chest pain and shortness of breath for couple weeks, recent  CT chest with RIGHT pleural effusion, underwent thoracentesis, re-accumulation of fluid in increased infiltrate, history COPD ; cytology positive for malignant effusion though uncertain as to whether pulmonary or GI source EXAM: CT CHEST WITH CONTRAST TECHNIQUE: Multidetector CT imaging of the chest was performed during intravenous contrast administration. Sagittal and coronal MPR images reconstructed from axial data set. CONTRAST:  87m OMNIPAQUE IOHEXOL 300 MG/ML  SOLN IV COMPARISON:  04/22/2015; correlation chest radiograph 05/03/2015 FINDINGS: Mild scattered atherosclerotic calcification aorta and coronary arteries. Persistent large at least partially  loculated RIGHT pleural effusion with pleural thickening and scattered enhancement consistent with malignant effusion. Multiple normal sized lymph nodes at RIGHT cardiophrenic angle. Complete atelectasis of RIGHT middle and RIGHT lower lobes. Extensive opacity within LEFT upper lobe without significant volume loss ; this may represent diffuse infiltrate though tumor is not excluded. Abnormal soft tissue at RIGHT hilum suspicious for adenopathy, node 17 mm short axis image 30. Enlarged precarinal lymph node 16 mm short axis image 29. Loculated fluid collection at RIGHT apex. Scattered upper normal sized LEFT hilar nodes. Visualized upper abdomen unremarkable. Large bulla LEFT apex 12.2 cm diameter. Again identified multiple subpleural nodular foci in LEFT lung question tumor versus infiltrate, largest 12 mm diameter. Scattered areas of minimal infiltrate in the LEFT upper and LEFT lower lobes are slightly increased since previous exam. No LEFT pleural effusion or pneumothorax. Degenerative disc disease changes lower cervical and mid thoracic spine. No definite osseous metastatic lesions. IMPRESSION: Persistent large at least partially loculated RIGHT pleural effusion with scattered areas of pleural thickening and enhancement consistent with malignant pleural  effusion as identified by prior thoracentesis and cytology. Complete atelectasis of RIGHT middle and RIGHT lower lobes. Subtotal opacification of the RIGHT upper lobe may represent diffuse consolidation or tumor. Mediastinal and suspected RIGHT hilar adenopathy with upper normal sized LEFT hilar nodes. Minimal patchy infiltrate LEFT lung increased since previous exam including several subpleural nodular foci in the LEFT upper and LEFT lower lobes, question infection versus tumor. 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 Electronically Signed   By: Lavonia Dana M.D.   On: 05/03/2015 18:59   US Thoracentesis Asp Pleural Space W/img Guide  05/04/2015  CLINICAL DATA:  Right pleural effusion EXAM: ULTRASOUND GUIDED RIGHT THORACENTESIS COMPARISON:  Chest CT 05/03/2015 PROCEDURE: An ultrasound guided thoracentesis was thoroughly discussed with the patient and questions answered. The benefits, risks, alternatives and complications were also discussed. The patient understands and wishes to proceed with the procedure. Written consent was obtained. Ultrasound was performed to localize and mark an adequate pocket of fluid in the right posterior chest. The area was then prepped and draped in the normal sterile fashion. 1% Lidocaine was used for local anesthesia. Under ultrasound guidance a 19 gauge Yueh catheter was introduced. Thoracentesis was performed. The catheter was removed and a dressing applied. COMPLICATIONS: None. FINDINGS: A total of approximately 1.4 L of bloody fluid was removed. A fluid sample wassent for laboratory analysis. IMPRESSION: Successful ultrasound guided right thoracentesis yielding 1.4 L of pleural fluid. Electronically Signed   By: Rolm Baptise M.D.   On: 05/04/2015 12:31    Medications: I have reviewed the patient's current medications.  Assesment:   Active Problems:   Pleural effusion on right   Malnutrition of moderate degree   COPD exacerbation (HCC)   Tobacco  use disorder   S/P thoracentesis   Cancer (HCC)   Shortness of breath    Plan:  Medications reviewed Will follow cytology result Continue current treatment     Wisdom Rickey 05/05/2015, 8:08 AM

## 2015-05-05 NOTE — Consult Note (Signed)
Patient seen, chart reviewed. Have been asked to place Port-A-Cath for treatment of lung carcinoma. We'll place Port-A-Cath tomorrow in a.m. The risks and benefits of the procedure including bleeding, infection, and pneumothorax were fully explained to the patient, who gave informed consent.

## 2015-05-06 ENCOUNTER — Encounter (HOSPITAL_COMMUNITY)
Admission: EM | Disposition: A | Discharge status: Home / Self Care | Payer: Self-pay | Source: Home / Self Care | Attending: Internal Medicine

## 2015-05-06 ENCOUNTER — Inpatient Hospital Stay (HOSPITAL_COMMUNITY): Payer: Medicaid Other | Admitting: Anesthesiology

## 2015-05-06 ENCOUNTER — Encounter (HOSPITAL_COMMUNITY): Payer: Self-pay | Admitting: *Deleted

## 2015-05-06 HISTORY — PX: ESOPHAGOGASTRODUODENOSCOPY (EGD) WITH PROPOFOL: SHX5813

## 2015-05-06 HISTORY — PX: COLONOSCOPY WITH PROPOFOL: SHX5780

## 2015-05-06 SURGERY — INSERTION, TUNNELED CENTRAL VENOUS DEVICE, WITH PORT
Anesthesia: Monitor Anesthesia Care

## 2015-05-06 SURGERY — COLONOSCOPY WITH PROPOFOL
Anesthesia: Monitor Anesthesia Care

## 2015-05-06 MED ORDER — PROPOFOL 10 MG/ML IV BOLUS
INTRAVENOUS | Status: AC
  Filled 2015-05-06: qty 20

## 2015-05-06 MED ORDER — CEFAZOLIN SODIUM-DEXTROSE 2-3 GM-% IV SOLR
INTRAVENOUS | Status: AC
  Filled 2015-05-06: qty 50

## 2015-05-06 MED ORDER — MIDAZOLAM HCL 2 MG/2ML IJ SOLN
1.0000 mg | INTRAMUSCULAR | Status: DC | PRN
  Administered 2015-05-06: 2 mg via INTRAVENOUS

## 2015-05-06 MED ORDER — WATER FOR IRRIGATION, STERILE IR SOLN
Status: DC | PRN
  Administered 2015-05-06: 1000 mL

## 2015-05-06 MED ORDER — ONDANSETRON HCL 4 MG/2ML IJ SOLN: 4.0000 mg | Freq: Once | INTRAMUSCULAR | Status: DC | PRN

## 2015-05-06 MED ORDER — FENTANYL CITRATE (PF) 100 MCG/2ML IJ SOLN: 25.0000 ug | INTRAMUSCULAR | Status: DC | PRN

## 2015-05-06 MED ORDER — LIDOCAINE VISCOUS 2 % MT SOLN
15.0000 mL | Freq: Once | OROMUCOSAL | Status: AC
  Administered 2015-05-06: 3 mL via OROMUCOSAL

## 2015-05-06 MED ORDER — MIDAZOLAM HCL 5 MG/5ML IJ SOLN
INTRAMUSCULAR | Status: DC | PRN
Start: 2015-05-06 — End: 2015-05-06
  Administered 2015-05-06: 2 mg via INTRAVENOUS

## 2015-05-06 MED ORDER — PROPOFOL 500 MG/50ML IV EMUL
INTRAVENOUS | Status: DC | PRN
  Administered 2015-05-06 (×2): 100 ug/kg/min via INTRAVENOUS

## 2015-05-06 MED ORDER — MIDAZOLAM HCL 2 MG/2ML IJ SOLN
INTRAMUSCULAR | Status: AC
  Filled 2015-05-06: qty 2

## 2015-05-06 MED ORDER — SODIUM CHLORIDE 0.9 % IV SOLN
INTRAVENOUS | Status: DC
Start: 2015-05-06 — End: 2015-05-06

## 2015-05-06 MED ORDER — LIDOCAINE VISCOUS 2 % MT SOLN
OROMUCOSAL | Status: AC
  Filled 2015-05-06: qty 15

## 2015-05-06 MED ORDER — MIDAZOLAM HCL 2 MG/2ML IJ SOLN
INTRAMUSCULAR | Status: AC
  Filled 2015-05-06: qty 4

## 2015-05-06 MED ORDER — LACTATED RINGERS IV SOLN
INTRAVENOUS | Status: DC
  Administered 2015-05-06: 11:00:00 via INTRAVENOUS

## 2015-05-06 SURGICAL SUPPLY — 22 items
AMPLIFEYE LARGE BLUE (MISCELLANEOUS) ×3 IMPLANT
ELECT REM PT RETURN 9FT ADLT (ELECTROSURGICAL)
ELECTRODE REM PT RTRN 9FT ADLT (ELECTROSURGICAL) IMPLANT
FCP BXJMBJMB 240X2.8X (CUTTING FORCEPS)
FLOOR PAD 36X40 (MISCELLANEOUS) ×3
FORCEPS BIOP RAD 4 LRG CAP 4 (CUTTING FORCEPS) IMPLANT
FORCEPS BIOP RJ4 240 W/NDL (CUTTING FORCEPS)
FORCEPS BXJMBJMB 240X2.8X (CUTTING FORCEPS) IMPLANT
FORMALIN 10 PREFIL 20ML (MISCELLANEOUS) IMPLANT
INJECTOR/SNARE I SNARE (MISCELLANEOUS) IMPLANT
KIT ENDO PROCEDURE PEN (KITS) ×3 IMPLANT
MANIFOLD NEPTUNE II (INSTRUMENTS) ×2 IMPLANT
NDL SCLEROTHERAPY 25GX240 (NEEDLE) IMPLANT
NEEDLE SCLEROTHERAPY 25GX240 (NEEDLE) IMPLANT
PAD FLOOR 36X40 (MISCELLANEOUS) ×1 IMPLANT
PROBE APC STR FIRE (PROBE) IMPLANT
PROBE INJECTION GOLD (MISCELLANEOUS)
PROBE INJECTION GOLD 7FR (MISCELLANEOUS) IMPLANT
SNARE SHORT THROW 13M SML OVAL (MISCELLANEOUS) ×3 IMPLANT
TRAP SPECIMEN MUCOUS 40CC (MISCELLANEOUS) IMPLANT
TUBING IRRIGATION ENDOGATOR (MISCELLANEOUS) IMPLANT
WATER STERILE IRR 1000ML POUR (IV SOLUTION) ×2 IMPLANT

## 2015-05-06 NOTE — Progress Notes (Signed)
Notified by the nurse and CNA that the second enema was not clear yet. Gave order for a third enema. CNA reports the third enema was successful, clear with some yellow tint but no debris or stool noted. Patient and staff state he drank the entire prep.

## 2015-05-06 NOTE — OR Nursing (Signed)
States  Results from  Prep are  Clear.

## 2015-05-06 NOTE — Progress Notes (Signed)
After third tap water enema, patients stool appears watery, tinted yellow.

## 2015-05-06 NOTE — Addendum Note (Signed)
Addendum  created 05/06/15 1313 by Rusty Aus, MD   Modules edited: Anesthesia Attestations

## 2015-05-06 NOTE — Progress Notes (Signed)
Subjective: Patient feels better. He is scheduled for colonoscopy. Objective: Vital signs in last 24 hours: Temp:  [97.8 F (36.6 C)-98.2 F (36.8 C)] 97.8 F (36.6 C) (10/21 0443) Pulse Rate:  [84-91] 84 (10/21 0443) Resp:  [20] 20 (10/21 0443) BP: (113-136)/(57-76) 115/73 mmHg (10/21 0443) SpO2:  [92 %-97 %] 96 % (10/21 0443) Weight change:  Last BM Date: 05/05/15  Intake/Output from previous day: 10/20 0701 - 10/21 0700 In: 1587.5 [P.O.:1080; I.V.:507.5] Out: -   PHYSICAL EXAM General appearance: alert and mild distress Resp: diminished breath sounds bilaterally and rhonchi bilaterally Cardio: S1, S2 normal GI: soft, non-tender; bowel sounds normal; no masses,  no organomegaly Extremities: extremities normal, atraumatic, no cyanosis or edema  Lab Results:  Results for orders placed or performed during the hospital encounter of 05/03/15 (from the past 48 hour(s))  CBC with Differential     Status: Abnormal   Collection Time: 05/05/15  9:39 AM  Result Value Ref Range   WBC 13.7 (H) 4.0 - 10.5 K/uL   RBC 3.49 (L) 4.22 - 5.81 MIL/uL   Hemoglobin 10.1 (L) 13.0 - 17.0 g/dL   HCT 89.5 (L) 94.4 - 58.2 %   MCV 88.0 78.0 - 100.0 fL   MCH 28.9 26.0 - 34.0 pg   MCHC 32.9 30.0 - 36.0 g/dL   RDW 06.2 03.3 - 86.9 %   Platelets 735 (H) 150 - 400 K/uL   Neutrophils Relative % 72 %   Neutro Abs 9.8 (H) 1.7 - 7.7 K/uL   Lymphocytes Relative 13 %   Lymphs Abs 1.8 0.7 - 4.0 K/uL   Monocytes Relative 8 %   Monocytes Absolute 1.2 (H) 0.1 - 1.0 K/uL   Eosinophils Relative 6 %   Eosinophils Absolute 0.9 (H) 0.0 - 0.7 K/uL   Basophils Relative 1 %   Basophils Absolute 0.1 0.0 - 0.1 K/uL  Comprehensive metabolic panel     Status: Abnormal   Collection Time: 05/05/15  9:39 AM  Result Value Ref Range   Sodium 133 (L) 135 - 145 mmol/L   Potassium 3.9 3.5 - 5.1 mmol/L   Chloride 97 (L) 101 - 111 mmol/L   CO2 29 22 - 32 mmol/L   Glucose, Bld 125 (H) 65 - 99 mg/dL   BUN 8 6 - 20 mg/dL    Creatinine, Ser 0.92 0.61 - 1.24 mg/dL   Calcium 8.6 (L) 8.9 - 10.3 mg/dL   Total Protein 5.9 (L) 6.5 - 8.1 g/dL   Albumin 2.2 (L) 3.5 - 5.0 g/dL   AST 19 15 - 41 U/L   ALT 25 17 - 63 U/L   Alkaline Phosphatase 98 38 - 126 U/L   Total Bilirubin 0.2 (L) 0.3 - 1.2 mg/dL   GFR calc non Af Amer >60 >60 mL/min   GFR calc Af Amer >60 >60 mL/min    Comment: (NOTE) The eGFR has been calculated using the CKD EPI equation. This calculation has not been validated in all clinical situations. eGFR's persistently <60 mL/min signify possible Chronic Kidney Disease.    Anion gap 7 5 - 15  Vitamin B12     Status: None   Collection Time: 05/05/15  9:39 AM  Result Value Ref Range   Vitamin B-12 626 180 - 914 pg/mL    Comment: (NOTE) This assay is not validated for testing neonatal or myeloproliferative syndrome specimens for Vitamin B12 levels. Performed at [facility]   Folate     Status: None  Collection Time: 05/05/15  9:39 AM  Result Value Ref Range   Folate 21.9 >5.9 ng/mL    Comment: Performed at [facility]  Iron and TIBC     Status: Abnormal   Collection Time: 05/05/15  9:39 AM  Result Value Ref Range   Iron 19 (L) 45 - 182 ug/dL   TIBC 221 (L) 250 - 450 ug/dL   Saturation Ratios 9 (L) 17.9 - 39.5 %   UIBC 202 ug/dL    Comment: Performed at [facility]  Ferritin     Status: None   Collection Time: 05/05/15  9:39 AM  Result Value Ref Range   Ferritin 120 24 - 336 ng/mL    Comment: Performed at [facility]  Reticulocytes     Status: Abnormal   Collection Time: 05/05/15  9:39 AM  Result Value Ref Range   Retic Ct Pct 1.0 0.4 - 3.1 %   RBC. 3.49 (L) 4.22 - 5.81 MIL/uL   Retic Count, Manual 34.9 19.0 - 186.0 K/uL    ABGS No results for input(s): PHART, PO2ART, TCO2, HCO3 in the last 72 hours.  Invalid input(s): PCO2 CULTURES Recent Results (from the past 240 hour(s))  Culture, respiratory (NON-Expectorated)     Status: None   Collection  Time: 04/27/15  9:25 AM  Result Value Ref Range Status   Specimen Description BRONCHIAL ALVEOLAR LAVAGE  Final   Special Requests Normal  Final   Gram Stain   Final    FEW WBC PRESENT,BOTH PMN AND MONONUCLEAR NO SQUAMOUS EPITHELIAL CELLS SEEN NO ORGANISMS SEEN Performed at [facility]    Culture   Final    NO GROWTH 2 DAYS Performed at [facility]    Report Status 04/29/2015 FINAL  Final  Fungus Culture with Smear     Status: None (Preliminary result)   Collection Time: 04/27/15  9:25 AM  Result Value Ref Range Status   Specimen Description BRONCHIAL ALVEOLAR LAVAGE  Final   Special Requests Normal  Final   Fungal Smear   Final    NO YEAST OR FUNGAL ELEMENTS SEEN Performed at [facility]    Culture   Final    CULTURE IN PROGRESS FOR FOUR WEEKS Performed at [facility]    Report Status PENDING  Incomplete  AFB culture with smear     Status: None (Preliminary result)   Collection Time: 04/27/15  9:25 AM  Result Value Ref Range Status   Specimen Description BRONCHIAL ALVEOLAR LAVAGE  Final   Special Requests Normal  Final   Acid Fast Smear   Final    NO ACID FAST BACILLI SEEN Performed at [facility]    Culture   Final    CULTURE WILL BE EXAMINED FOR 6 WEEKS BEFORE ISSUING A FINAL REPORT Performed at [facility]    Report Status PENDING  Incomplete   Studies/Results: Dg Chest 1 View  05/04/2015  CLINICAL DATA:  Post right thoracentesis EXAM: CHEST 1 VIEW COMPARISON:  05/03/2015 FINDINGS: Decreasing right effusion. Moderate partially loculated right effusion persists with diffuse right lung airspace disease. No pneumothorax. Underlying COPD. Linear scarring or atelectasis in the left upper lobe. Heart is normal size. IMPRESSION: Decreasing right effusion with moderate partially loculated right effusion persisting. No pneumothorax. Diffuse right lung airspace disease. COPD. Electronically Signed   By: Rolm Baptise M.D.    On: 05/04/2015 12:30   Nm Bone Scan Whole Body  05/05/2015  CLINICAL DATA:  Malignant cells on recent right thoracentesis. Initial encounter. EXAM: NUCLEAR MEDICINE WHOLE BODY BONE SCAN TECHNIQUE: Whole body anterior and posterior images were obtained approximately 3 hours after intravenous injection of radiopharmaceutical. RADIOPHARMACEUTICALS:  27.01 mCi Technetium-38m MDP IV COMPARISON:  Chest CT 05/03/2015.  Abdominal pelvic CT 04/27/2015. FINDINGS: There is no osseous activity favoring metastatic disease. There is a mild thoracolumbar scoliosis. There is mild degenerative activity at the shoulders, knees and great toes bilaterally. The soft tissue activity appears unremarkable. IMPRESSION: No evidence of osseous metastatic disease. Electronically Signed   By: Richardean Sale M.D.   On: 05/05/2015 14:29   US Thoracentesis Asp Pleural Space W/img Guide  05/04/2015  CLINICAL DATA:  Right pleural effusion EXAM: ULTRASOUND GUIDED RIGHT THORACENTESIS COMPARISON:  Chest CT 05/03/2015 PROCEDURE: An ultrasound guided thoracentesis was thoroughly discussed with the patient and questions answered. The benefits, risks, alternatives and complications were also discussed. The patient understands and wishes to proceed with the procedure. Written consent was obtained. Ultrasound was performed to localize and mark an adequate pocket of fluid in the right posterior chest. The area was then prepped and draped in the normal sterile fashion. 1% Lidocaine was used for local anesthesia. Under ultrasound guidance a 19 gauge Yueh catheter was introduced. Thoracentesis was performed. The catheter was removed and a dressing applied. COMPLICATIONS: None. FINDINGS: A total of approximately 1.4 L of bloody fluid was removed. A fluid sample wassent for laboratory analysis. IMPRESSION: Successful ultrasound guided right thoracentesis yielding 1.4 L of pleural fluid. Electronically Signed   By: Rolm Baptise M.D.   On: 05/04/2015 12:31     Medications: I have reviewed the patient's current medications.  Assesment:   Active Problems:   Pleural effusion on right   Malnutrition of moderate degree   COPD exacerbation (HCC)   Tobacco use disorder   S/P thoracentesis   Cancer (HCC)   Shortness of breath   ETOH abuse   Drug abuse    Plan:  Medications reviewed Continue current treatment As per GI plan    LOS: 1 day   Jocelynn Gioffre 05/06/2015, 8:13 AM

## 2015-05-06 NOTE — Anesthesia Preprocedure Evaluation (Signed)
Anesthesia Evaluation  Patient identified by MRN, date of birth, ID band  Reviewed: Allergy & Precautions, NPO status , Patient's Chart, lab work & pertinent test results  Airway Mallampati: II  TM Distance: >3 FB     Dental  (+) Missing,    Pulmonary shortness of breath and with exertion, pneumonia, resolved, COPD, former smoker,     + decreased breath sounds      Cardiovascular Normal cardiovascular exam     Neuro/Psych    GI/Hepatic Bowel prep,(+)     substance abuse  alcohol use, cocaine use and marijuana use,   Endo/Other    Renal/GU      Musculoskeletal   Abdominal Normal abdominal exam  (+)   Peds  Hematology  (+) anemia ,   Anesthesia Other Findings   Reproductive/Obstetrics                             Anesthesia Physical Anesthesia Plan  ASA: III  Anesthesia Plan: MAC   Post-op Pain Management:    Induction: Intravenous  Airway Management Planned: Simple Face Mask  Additional Equipment:   Intra-op Plan:   Post-operative Plan:   Informed Consent: I have reviewed the patients History and Physical, chart, labs and discussed the procedure including the risks, benefits and alternatives for the proposed anesthesia with the patient or authorized representative who has indicated his/her understanding and acceptance.   Dental advisory given  Plan Discussed with: CRNA  Anesthesia Plan Comments:         Anesthesia Quick Evaluation

## 2015-05-06 NOTE — Anesthesia Postprocedure Evaluation (Signed)
  Anesthesia Post-op Note  Patient: Brian Nelson  Procedure(s) Performed: Procedure(s): COLONOSCOPY WITH PROPOFOL; IN CECUM AT 1159; WITHDRAWAL TIME 23 MINUTES (N/A) ESOPHAGOGASTRODUODENOSCOPY (EGD) WITH PROPOFOL (N/A)  Patient Location: PACU  Anesthesia Type:MAC  Level of Consciousness: awake, alert  and patient cooperative  Airway and Oxygen Therapy: Patient Spontanous Breathing and Patient connected to face mask oxygen  Post-op Pain: none  Post-op Assessment: Post-op Vital signs reviewed, Patient's Cardiovascular Status Stable, Respiratory Function Stable, Patent Airway, No signs of Nausea or vomiting and Pain level controlled              Post-op Vital Signs: Reviewed and stable  Last Vitals:  Filed Vitals:   05/06/15 1110  BP: 120/74  Pulse:   Temp:   Resp: 22    Complications: No apparent anesthesia complications

## 2015-05-06 NOTE — Transfer of Care (Signed)
Immediate Anesthesia Transfer of Care Note  Patient: Brian Nelson  Procedure(s) Performed: Procedure(s): COLONOSCOPY WITH PROPOFOL; IN CECUM AT 1159; WITHDRAWAL TIME 23 MINUTES (N/A) ESOPHAGOGASTRODUODENOSCOPY (EGD) WITH PROPOFOL (N/A)  Patient Location: PACU  Anesthesia Type:MAC  Level of Consciousness: awake and patient cooperative  Airway & Oxygen Therapy: Patient Spontanous Breathing and Patient connected to face mask oxygen  Post-op Assessment: Report given to RN, Post -op Vital signs reviewed and stable and Patient moving all extremities  Post vital signs: Reviewed and stable  Last Vitals:  Filed Vitals:   05/06/15 1110  BP: 120/74  Pulse:   Temp:   Resp: 22    Complications: No apparent anesthesia complications

## 2015-05-06 NOTE — Progress Notes (Signed)
Port-A-Cath postponed due to the need for a colonoscopy. If patient is in the hospital on 05/09/2015, will proceed with Port-A-Cath insertion. If not, this may be done as an outpatient.

## 2015-05-06 NOTE — Care Management Note (Signed)
Case Management Note  Patient Details  Name: Brian Nelson MRN: [id number] Date of Birth: [birth year]  Subjective/Objective:                    Action/Plan:   Expected Discharge Date:  05/06/15               Expected Discharge Plan:  Home/Self Care  In-House Referral:  NA  Discharge planning Services  CM Consult  Post Acute Care Choice:  Durable Medical Equipment Choice offered to:  Patient  DME Arranged:    DME Agency:     HH Arranged:    [place] Agency:     Status of Service:  In process, will continue to follow  Medicare Important Message Given:    Date Medicare IM Given:    Medicare IM give by:    Date Additional Medicare IM Given:    Additional Medicare Important Message give by:     If discussed at [place] of Stay Meetings, dates discussed:    Additional Comments: Pt having TCS today. Anticipate discharge over the weekend. Will need home O2 assessment prior to discharge. Will have portacath placed as outpt.  Christinia Gully [place], RN 05/06/2015, 1:40 PM

## 2015-05-06 NOTE — OR Nursing (Signed)
Voided 250 cc yellow   urine

## 2015-05-06 NOTE — H&P (Signed)
  Primary Care Physician:  [facility] Primary Gastroenterologist:  Dr. Oneida Alar  Pre-Procedure History & Physical: HPI:  Brian Nelson is a 54 y.o. male here for PT HAS LUNG MASS-NSCL LUNG CA, POSSIBLE METASTATIC GI ADENOCA.  Past Medical History  Diagnosis Date  . Tobacco use disorder 04/23/2015    Quit Fall 2016  . ETOH abuse     Quit Fall 2016  . Illicit drug use     Quit Fall 2016    Past Surgical History  Procedure Laterality Date  . Video bronchoscopy Bilateral 04/27/2015    Procedure: VIDEO BRONCHOSCOPY WITH FLUORO;  Surgeon: Juanito Doom, MD;  Location: [facility];  Service: Cardiopulmonary;  Laterality: Bilateral;  . Hernia repair Right 2009    [place]    Prior to Admission medications   Not on File    Allergies as of 05/03/2015  . (No Known Allergies)    Family History  Problem Relation Age of Onset  . Diabetes Mother     Social History   Social History  . Marital Status: Married    Spouse Name: N/A  . Number of Children: N/A  . Years of Education: N/A   Occupational History  . Concrete work    Social History Main Topics  . Smoking status: Former Smoker -- 2.00 packs/day for 40 years    Types: Cigarettes    Quit date: 04/22/2015  . Smokeless tobacco: Never Used  . Alcohol Use: 3.6 oz/week    6 Cans of beer per week     Comment: Quit in fall 2016  . Drug Use: Yes    Special: Cocaine, Marijuana     Comment: Quit in Fall 2016  . Sexual Activity: Not on file   Other Topics Concern  . Not on file   Social History Narrative    Review of Systems: See HPI, otherwise negative ROS   Physical Exam: BP 120/74 mmHg  Pulse 84  Temp(Src) 97.8 F (36.6 C) (Oral)  Resp 22  Ht '5\' 8"'$  (1.727 m)  Wt 130 lb (58.968 kg)  BMI 19.77 kg/m2  SpO2 92% General:   Alert,  pleasant and cooperative in NAD Head:  Normocephalic and atraumatic. Neck:  Supple; Lungs:  Clear throughout to auscultation.     Heart:  Regular rate and rhythm. Abdomen:  Soft, nontender and nondistended. Normal bowel sounds, without guarding, and without rebound.   Neurologic:  Alert and  oriented x4;  grossly normal neurologically.  Impression/Plan:    SCREENING: PT HAS LUNG MASS-NSCL LUNG CA, POSSIBLE METASTATIC GI ADENOCA  PLAN: 1. TCS/POSSIBLE EGD TODAY.

## 2015-05-07 ENCOUNTER — Inpatient Hospital Stay (HOSPITAL_COMMUNITY)
Admission: EM | Admit: 2015-05-07 | Discharge: 2015-05-14 | Disposition: A | Discharge status: Home / Self Care | Payer: Medicaid Other | Source: Home / Self Care | Attending: Internal Medicine | Admitting: Internal Medicine

## 2015-05-07 DIAGNOSIS — R0902 Hypoxemia: Secondary | ICD-10-CM

## 2015-05-07 DIAGNOSIS — Z9889 Other specified postprocedural states: Secondary | ICD-10-CM

## 2015-05-07 DIAGNOSIS — J90 Pleural effusion, not elsewhere classified: Secondary | ICD-10-CM | POA: Diagnosis present

## 2015-05-07 DIAGNOSIS — D72829 Elevated white blood cell count, unspecified: Secondary | ICD-10-CM | POA: Diagnosis present

## 2015-05-07 DIAGNOSIS — Z95828 Presence of other vascular implants and grafts: Secondary | ICD-10-CM

## 2015-05-07 DIAGNOSIS — J984 Other disorders of lung: Secondary | ICD-10-CM

## 2015-05-07 MED ORDER — OXYCODONE-ACETAMINOPHEN 5-325 MG PO TABS: 1.0000 | ORAL_TABLET | ORAL | Status: DC | PRN

## 2015-05-07 MED ORDER — ONDANSETRON HCL 4 MG/2ML IJ SOLN: 4.0000 mg | Freq: Four times a day (QID) | INTRAMUSCULAR | Status: DC | PRN

## 2015-05-07 NOTE — Addendum Note (Signed)
Addendum  created 05/07/15 1125 by Charmaine Downs, CRNA   Modules edited: Notes Section   Notes Section:  File: [id number]

## 2015-05-07 NOTE — Progress Notes (Signed)
Patient ID: Brian Nelson, male   DOB: 1961-06-20, 54 y.o.   MRN: [id number]  Assessment/Plan: ADMITTED WITH CHEST PAIN AND SOB DUE TO LUNG MASSES. S/P EGD/TCS OCT 21-NO GI CANCER.  PLAN: 1. CONTINUE TO MONITOR SYMPTOMS. 2. NO ADDITIONAL GI WORKUP NEEDED AT THIS TIME 3. Will SIGN OFF. PLEASE CALL WITH QUESTIONS. 4. EXPLAINED RESULTS OF EGD/TCS. DEFER FUTURE PLANS TO ONCOLOGY.     Subjective: Since I last evaluated the patient No questions or concerns EXCEPT IS HE GOING HOME TODAY & IF SO DOES HE NEED OXYGEN..  Objective: Vital signs in last 24 hours: Filed Vitals:   05/07/15 0440  BP: 105/66  Pulse: 93  Temp: 98.2 F (36.8 C)  Resp: 20   General appearance: alert, cooperative and no distress Resp: diminished breath sounds anterior - bilateral and bilaterally Cardio: regular rate and rhythm GI: soft, non-tender; bowel sounds normal; no masses,  no organomegaly  Lab Results: NONE  Studies/Results: No results found.  Medications: I have reviewed the patient's current medications.   LOS: 5 days   Barney Drain 12/24/2013, 2:23 PM

## 2015-05-07 NOTE — Discharge Summary (Addendum)
Physician Discharge Summary  Patient ID: Brian Nelson MRN: [id number] DOB/AGE: 52-Dec-1962 52 y.o. Primary Care Physician:[facility] date: 05/03/2015 Discharge date: 05/07/2015    Discharge Diagnoses:   Active Problems:   Pleural effusion on right   Malnutrition of moderate degree   COPD exacerbation (HCC)   Tobacco use disorder   S/P thoracentesis   Cancer (HCC)   Shortness of breath   ETOH abuse   Drug abuse     Medication List    TAKE these medications        oxyCODONE-acetaminophen 5-325 MG tablet  Commonly known as:  PERCOCET/ROXICET  Take 1-2 tablets by mouth every 4 (four) hours as needed for moderate pain or severe pain.        Discharged Condition: stable    Consults: home  Significant Diagnostic Studies: Dg Chest 1 View  05/04/2015  CLINICAL DATA:  Post right thoracentesis EXAM: CHEST 1 VIEW COMPARISON:  05/03/2015 FINDINGS: Decreasing right effusion. Moderate partially loculated right effusion persists with diffuse right lung airspace disease. No pneumothorax. Underlying COPD. Linear scarring or atelectasis in the left upper lobe. Heart is normal size. IMPRESSION: Decreasing right effusion with moderate partially loculated right effusion persisting. No pneumothorax. Diffuse right lung airspace disease. COPD. Electronically Signed   By: Rolm Baptise M.D.   On: 05/04/2015 12:30   Dg Chest 1 View  04/23/2015  CLINICAL DATA:  Right thoracentesis. EXAM: CHEST 1 VIEW COMPARISON:  04/22/2015 FINDINGS: Much less pleural density in the right lower chest compared to the yesterday study. Right upper lobe infiltrate with opacified bullous disease at the right apex appears unchanged. Severe emphysema again noted on the left with increased interstitial markings. No pneumothorax post procedure. IMPRESSION: No complication following right paracentesis. Much less pleural density at the right lung base. Persistent right upper lobe pneumonia with  opacified bullous disease at the apex. Electronically Signed   By: Nelson Chimes M.D.   On: 04/23/2015 11:23   Dg Chest 2 View  05/03/2015  CLINICAL DATA:  Shortness of breath, mild chest pain, had fluid drawn from RIGHT hemi thorax last week EXAM: CHEST  2 VIEW COMPARISON:  04/27/2015 FINDINGS: Subtotal opacification of the RIGHT hemi thorax by a combination of fluid and increased lung atelectasis versus consolidation. Slight mediastinal shift to the LEFT. Stable heart size and pulmonary vascularity. Underlying emphysematous changes with linear subsegmental atelectasis versus scarring in LEFT upper lobe. Questionable mild LEFT perihilar infiltrate. No LEFT pleural effusion or pneumothorax. IMPRESSION: Increased opacification of the RIGHT hemi thorax by combination of pleural effusion and increased RIGHT lung atelectasis versus consolidation. COPD changes with atelectasis versus scarring in LEFT upper lobe and question mild LEFT perihilar infiltrate. Electronically Signed   By: Lavonia Dana M.D.   On: 05/03/2015 17:56   Dg Chest 2 View  04/25/2015  CLINICAL DATA:  Pleural effusion EXAM: CHEST  2 VIEW COMPARISON:  04/23/2015 FINDINGS: Dense consolidation again noted in the right upper lobe. Small right pleural effusion. Underlying COPD with bullous changes in the apices. No confluent opacity on the left. Heart is normal size. IMPRESSION: Small right pleural effusion. Dense consolidation persists in the right upper lobe. Severe COPD. Electronically Signed   By: Rolm Baptise M.D.   On: 04/25/2015 11:26   Dg Chest 2 View  04/22/2015  CLINICAL DATA:  Right-sided chest pain and shortness of breath since yesterday. EXAM: CHEST  2 VIEW COMPARISON:  None. FINDINGS: Cardiomediastinal silhouette is normal. Mediastinal contours appear intact.  The lungs are hyperinflated, and there is evidence of emphysematous changes with upper lobe predominance. There is a lucency within the left lung apex, which may represent a  pneumothorax versus bullous changes. There is a small left pleural effusion. There is a large right pleural effusion. There is near complete opacification of the right upper lobe of the lung. Osseous structures are without acute abnormality. Soft tissues are grossly normal. IMPRESSION: Lung emphysema. Left apical pneumothorax versus bullous changes of the lung parenchyma. Small left pleural effusion. Large right pleural effusion. Near complete opacification of the right upper lobe of the lung. This may represent infectious consolidation, however endobronchial lesion causing postobstructive pneumonia cannot be excluded. CT of the chest may be considered. Electronically Signed   By: Fidela Salisbury M.D.   On: 04/22/2015 21:11   Ct Chest W Contrast  05/03/2015  CLINICAL DATA:  Chest pain and shortness of breath for couple weeks, recent CT chest with RIGHT pleural effusion, underwent thoracentesis, re-accumulation of fluid in increased infiltrate, history COPD ; cytology positive for malignant effusion though uncertain as to whether pulmonary or GI source EXAM: CT CHEST WITH CONTRAST TECHNIQUE: Multidetector CT imaging of the chest was performed during intravenous contrast administration. Sagittal and coronal MPR images reconstructed from axial data set. CONTRAST:  64m OMNIPAQUE IOHEXOL 300 MG/ML  SOLN IV COMPARISON:  04/22/2015; correlation chest radiograph 05/03/2015 FINDINGS: Mild scattered atherosclerotic calcification aorta and coronary arteries. Persistent large at least partially loculated RIGHT pleural effusion with pleural thickening and scattered enhancement consistent with malignant effusion. Multiple normal sized lymph nodes at RIGHT cardiophrenic angle. Complete atelectasis of RIGHT middle and RIGHT lower lobes. Extensive opacity within LEFT upper lobe without significant volume loss ; this may represent diffuse infiltrate though tumor is not excluded. Abnormal soft tissue at RIGHT hilum suspicious  for adenopathy, node 17 mm short axis image 30. Enlarged precarinal lymph node 16 mm short axis image 29. Loculated fluid collection at RIGHT apex. Scattered upper normal sized LEFT hilar nodes. Visualized upper abdomen unremarkable. Large bulla LEFT apex 12.2 cm diameter. Again identified multiple subpleural nodular foci in LEFT lung question tumor versus infiltrate, largest 12 mm diameter. Scattered areas of minimal infiltrate in the LEFT upper and LEFT lower lobes are slightly increased since previous exam. No LEFT pleural effusion or pneumothorax. Degenerative disc disease changes lower cervical and mid thoracic spine. No definite osseous metastatic lesions. IMPRESSION: Persistent large at least partially loculated RIGHT pleural effusion with scattered areas of pleural thickening and enhancement consistent with malignant pleural effusion as identified by prior thoracentesis and cytology. Complete atelectasis of RIGHT middle and RIGHT lower lobes. Subtotal opacification of the RIGHT upper lobe may represent diffuse consolidation or tumor. Mediastinal and suspected RIGHT hilar adenopathy with upper normal sized LEFT hilar nodes. Minimal patchy infiltrate LEFT lung increased since previous exam including several subpleural nodular foci in the LEFT upper and LEFT lower lobes, question infection versus tumor. 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 Electronically Signed   By: MLavonia DanaM.D.   On: 05/03/2015 18:59   Ct Chest W Contrast  04/22/2015  CLINICAL DATA:  Intermittent right-sided chest pain and shortness of breath beginning yesterday. History of emphysema. EXAM: CT CHEST WITH CONTRAST TECHNIQUE: Multidetector CT imaging of the chest was performed during intravenous contrast administration. CONTRAST:  1050mOMNIPAQUE IOHEXOL 300 MG/ML  SOLN COMPARISON:  Radiography same day FINDINGS: There is a background pattern of advanced emphysema I with extensive bullous changes in the upper  lobes. On the left, there is a dominant bulla measuring 12 cm in diameter. In the left lung, there are a few foci of nodular density measuring up to 1 cm in size which will require follow-up after treatment of this acute episode. Some of these could meet criteria for suspicion of malignancy. On the right, there is a large effusion primarily loculated at the base worrisome for empyema. There is considerable volume loss in the right lower lung because of that. There is consolidation in the right upper lobe with fluid filling Bas lie at the apex consistent with advanced right upper lobe pneumonia. Certainly, a mass lesion could be hidden and this area will will need to be evaluated after treatment. There are slightly prominent lymph nodes in the mediastinum that are likely reactive to the inflammatory process. There is no pneumothorax. Scans in the upper abdomen do not show any abnormality. IMPRESSION: Advanced right upper lobe pneumonia. Extensive empyema at the right lung base. Compressive atelectasis of the right lung. Background pattern of centrilobular emphysema with a 12 cm bulla at the left lung apex. Areas of abnormal density in the left lung which will require follow-up after treatment of this acute inflammatory process. Some of these made meet criteria for suspicion for lung cancer. Electronically Signed   By: Nelson Chimes M.D.   On: 04/22/2015 22:08   Mr Jeri Cos JK Contrast  04/28/2015  CLINICAL DATA:  Malignant effusion. Cytology positive for non-small cell carcinoma. EXAM: MRI HEAD WITHOUT AND WITH CONTRAST TECHNIQUE: Multiplanar, multiecho pulse sequences of the brain and surrounding structures were obtained without and with intravenous contrast. CONTRAST:  14 mL MultiHance COMPARISON:  None. FINDINGS: No acute infarct, hemorrhage, or mass lesion is present. The ventricles are of normal size. No significant extraaxial fluid collection is present. The postcontrast images demonstrate no pathologic  enhancement to suggest metastatic disease the brain or meninges. No significant white matter disease is present. The internal auditory canals are within normal limits. The brainstem is within normal limits. Flow is present in the major intracranial arteries. The globes and orbits are within normal limits. The paranasal sinuses are clear. There is some fluid in the left mastoid air cells. No obstructing nasopharyngeal lesion is present. The skullbase is within normal limits. Midline structures demonstrate degenerative change in the upper cervical spine. Intracranial midline structures are within normal limits. IMPRESSION: 1. Normal MRI appearance of the brain. No evidence for metastatic disease to the brain or meninges. 2. Degenerative changes in the upper cervical spine without definite osseous metastases. Electronically Signed   By: San Morelle M.D.   On: 04/28/2015 12:10   Nm Bone Scan Whole Body  05/05/2015  CLINICAL DATA:  Malignant cells on recent right thoracentesis. Initial encounter. EXAM: NUCLEAR MEDICINE WHOLE BODY BONE SCAN TECHNIQUE: Whole body anterior and posterior images were obtained approximately 3 hours after intravenous injection of radiopharmaceutical. RADIOPHARMACEUTICALS:  27.01 mCi Technetium-84mMDP IV COMPARISON:  Chest CT 05/03/2015.  Abdominal pelvic CT 04/27/2015. FINDINGS: There is no osseous activity favoring metastatic disease. There is a mild thoracolumbar scoliosis. There is mild degenerative activity at the shoulders, knees and great toes bilaterally. The soft tissue activity appears unremarkable. IMPRESSION: No evidence of osseous metastatic disease. Electronically Signed   By: WRichardean SaleM.D.   On: 05/05/2015 14:29   Ct Abdomen Pelvis W Contrast  04/27/2015  CLINICAL DATA:  Malignant pleural effusions suggesting GI origin. No abdominal complaints. EXAM: CT ABDOMEN AND PELVIS WITH CONTRAST TECHNIQUE: Multidetector CT imaging  of the abdomen and pelvis was  performed using the standard protocol following bolus administration of intravenous contrast. CONTRAST:  138m OMNIPAQUE IOHEXOL 300 MG/ML  SOLN COMPARISON:  Ultrasound abdomen 04/23/2015 FINDINGS: Moderate size right pleural effusion with probable loculation. Mild nodular pleural thickening. This may represent malignant effusion. Atelectasis or consolidation in the right lung base. Irregular nodule in the left lung base anteriorly measuring 11 mm diameter. Additional ground-glass nodules demonstrated in the left lung base. Metastasis is suspected. Emphysematous changes in the lungs. Scattered calcified granulomas in the liver. No focal liver lesions otherwise demonstrated. Gallbladder is contracted, likely physiologic. The pancreas, spleen, kidneys, and inferior vena cava are unremarkable. Calcification of abdominal aorta without aneurysm. No definite retroperitoneal lymphadenopathy. Decreased abdominal fat limits evaluation of the retroperitoneum. Stomach and small bowel are decompressed. Contrast material is present in the colon suggesting no evidence of bowel obstruction. Diffusely stool-filled colon. No free air or free fluid demonstrated in the abdomen. Pelvis: Calcification in the prostate gland. Mild thickening of the bladder wall may indicate cystitis or hypertrophy due to outlet obstruction. No free or loculated pelvic fluid collections. Appendix not identified. Rectosigmoid colon is mostly decompressed. There is a bone sclerosis at multiple endplates likely representing discogenic change. No definite destructive bone lesions. Degenerative changes in the lumbar spine. IMPRESSION: Moderate right pleural effusion with loculation and pleural nodularity suggesting malignant effusion. Atelectasis in the right lung base. Ground-glass nodules in the left lung base, largest 1.1 cm. This is likely metastatic although inflammatory nodularity not entirely excluded. No definite evidence of malignancy demonstrated in  the abdomen or pelvis although technical factors limits evaluation of bowel and colon. Bladder wall thickening may be due to cystitis or outlet obstruction. Electronically Signed   By: WLucienne CapersM.D.   On: 04/27/2015 23:05   Dg Chest Port 1 View  04/27/2015  CLINICAL DATA:  Status post bronchoscopy with biopsy EXAM: PORTABLE CHEST 1 VIEW COMPARISON:  April 25, 2015. FINDINGS: The heart size and mediastinal contours are within normal limits. No pneumothorax is noted. Mild right pleural effusion is noted. 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 Stable diffuse opacification of right upper lobe is noted. The visualized skeletal structures are unremarkable. IMPRESSION: Stable right upper lobe opacity is noted concerning for pneumonia. Mild right pleural effusion is noted. Electronically Signed   By: JMarijo Conception M.D.   On: 04/27/2015 10:36   UKoreaThoracentesis Asp Pleural Space W/img Guide  05/04/2015  CLINICAL DATA:  Right pleural effusion EXAM: ULTRASOUND GUIDED RIGHT THORACENTESIS COMPARISON:  Chest CT 05/03/2015 PROCEDURE: An ultrasound guided thoracentesis was thoroughly discussed with the patient and questions answered. The benefits, risks, alternatives and complications were also discussed. The patient understands and wishes to proceed with the procedure. Written consent was obtained. Ultrasound was performed to localize and mark an adequate pocket of fluid in the right posterior chest. The area was then prepped and draped in the normal sterile fashion. 1% Lidocaine was used for local anesthesia. Under ultrasound guidance a 19 gauge Yueh catheter was introduced. Thoracentesis was performed. The catheter was removed and a dressing applied. COMPLICATIONS: None. FINDINGS: A total of approximately 1.4 L of bloody fluid was removed. A fluid sample wassent for laboratory analysis. IMPRESSION: Successful ultrasound guided right thoracentesis  yielding 1.4 L of pleural fluid. Electronically Signed   By: KRolm BaptiseM.D.   On: 05/04/2015 12:31   UKoreaThoracentesis Asp Pleural Space W/img Guide  04/23/2015  INDICATION: Pneumonia, smoker, cough, dyspnea, chest pain, right pleural effusion. Request is made for diagnostic and therapeutic right thoracentesis. EXAM: ULTRASOUND GUIDED DIAGNOSTIC AND THERAPEUTIC RIGHT THORACENTESIS COMPARISON:  None. MEDICATIONS: None COMPLICATIONS: None immediate TECHNIQUE: Informed written consent was obtained from the patient after a discussion of the risks, benefits and alternatives to treatment. A timeout was performed prior to the initiation of the procedure. Initial ultrasound scanning demonstrates a moderate to large right pleural effusion. The lower chest was prepped and draped in the usual sterile fashion. 1% lidocaine was used for local anesthesia. An ultrasound image was saved for documentation purposes. A 6 Fr Safe-T-Centesis catheter was introduced. The thoracentesis was performed. The catheter was removed and a dressing was applied. The patient tolerated the procedure well without immediate post procedural complication. The patient was escorted to have an upright chest radiograph. FINDINGS: A total of approximately 1.4 liters of turbid, amber fluid was removed. Requested samples were sent to the laboratory. IMPRESSION: Successful ultrasound-guided diagnostic and therapeutic right sided thoracentesis yielding 1.4 liters of pleural fluid. Read by: Rowe Robert, PA-C Electronically Signed   By: Aletta Edouard M.D.   On: 04/23/2015 11:06   Dg C-arm Bronchoscopy  04/27/2015  CLINICAL DATA:  C-ARM BRONCHOSCOPY Fluoroscopy was utilized by the requesting physician.  No radiographic interpretation.    Lab Results: Basic Metabolic Panel:  Recent Labs  05/05/15 0939  NA 133*  K 3.9  CL 97*  CO2 29  GLUCOSE 125*  BUN 8  CREATININE 0.72  CALCIUM 8.6*   Liver Function Tests:  Recent Labs   05/05/15 0939  AST 19  ALT 25  ALKPHOS 98  BILITOT 0.2*  PROT 5.9*  ALBUMIN 2.2*     CBC:  Recent Labs  05/05/15 0939  WBC 13.7*  NEUTROABS 9.8*  HGB 10.1*  HCT 30.7*  MCV 88.0  PLT 735*    No results found for this or any previous visit (from the past 240 hour(s)).   Hospital Course:   This is a 54 years old male who was admitted due to shortness of breath. Patient was found to large rt side pleural effusion. Patient had thoracentesis. He feels better. Patient was recently was admitted to similar condition and his cytology showed adenocarcinoma but lung biopsy was negative. Patient is being evaluted by oncologist and he will be followed in out patient.  Discharge Exam: Blood pressure 105/66, pulse 93, temperature 98.2 F (36.8 C), temperature source Oral, resp. rate 20, height '5\' 8"'$  (1.727 m), weight 58.968 kg (130 lb), SpO2 94 %.    Disposition:  home        Follow-up Information    Follow up with [facility].   Contact information:   [address] [place] [address] [phone]       Follow up with [facility], MD In 1 week.   Specialty:  Internal Medicine   Contact information:   [address] [place] [address] [phone]       Signed: Rosita Fire   05/07/2015, 11:08 AM

## 2015-05-07 NOTE — Anesthesia Postprocedure Evaluation (Signed)
  Anesthesia Post-op Note  Patient: Cordai Rodrigue  Procedure(s) Performed: Procedure(s): COLONOSCOPY WITH PROPOFOL; IN CECUM AT 1159; WITHDRAWAL TIME 23 MINUTES (N/A) ESOPHAGOGASTRODUODENOSCOPY (EGD) WITH PROPOFOL (N/A)  Patient Location: room 325  Anesthesia Type:MAC  Level of Consciousness: awake, alert , oriented and patient cooperative  Airway and Oxygen Therapy: Patient Spontanous Breathing  Post-op Pain: none  Post-op Assessment: Post-op Vital signs reviewed, Patient's Cardiovascular Status Stable, Respiratory Function Stable, Patent Airway, No signs of Nausea or vomiting and Adequate PO intake              Post-op Vital Signs: Reviewed and stable  Last Vitals:  Filed Vitals:   05/07/15 0440  BP: 105/66  Pulse: 93  Temp: 36.8 C  Resp: 20    Complications: No apparent anesthesia complications

## 2015-05-07 NOTE — Progress Notes (Signed)
Patient discharged home.  IV removed - WNL.  Informed patient that he did not qualify for home o2. Reviewed medications.  Instructed to follow up with PCP, oncology and surgery for port placement.  Verbalizes understanding. No further questions at this time.  Awaiting arrival of family for transportation home.

## 2015-05-07 NOTE — Consult Note (Signed)
Patient does not qualify for home O2.  Sats remained 93% and above while ambulating halls.

## 2015-05-08 ENCOUNTER — Encounter (HOSPITAL_COMMUNITY): Payer: Self-pay | Admitting: *Deleted

## 2015-05-08 ENCOUNTER — Emergency Department (HOSPITAL_COMMUNITY): Payer: Medicaid Other

## 2015-05-08 DIAGNOSIS — J90 Pleural effusion, not elsewhere classified: Secondary | ICD-10-CM

## 2015-05-08 DIAGNOSIS — D72829 Elevated white blood cell count, unspecified: Secondary | ICD-10-CM

## 2015-05-08 LAB — CBC WITH DIFFERENTIAL/PLATELET
BASOS PCT: 1 %
Basophils Absolute: 0.1 10*3/uL (ref 0.0–0.1)
Eosinophils Absolute: 0.4 10*3/uL (ref 0.0–0.7)
Eosinophils Relative: 2 %
HEMATOCRIT: 29.7 % — AB (ref 39.0–52.0)
Hemoglobin: 10 g/dL — ABNORMAL LOW (ref 13.0–17.0)
LYMPHS ABS: 1.4 10*3/uL (ref 0.7–4.0)
LYMPHS PCT: 8 %
MCH: 29.1 pg (ref 26.0–34.0)
MCHC: 33.7 g/dL (ref 30.0–36.0)
MCV: 86.3 fL (ref 78.0–100.0)
MONOS PCT: 9 %
Monocytes Absolute: 1.4 10*3/uL — ABNORMAL HIGH (ref 0.1–1.0)
NEUTROS ABS: 13.7 10*3/uL — AB (ref 1.7–7.7)
Neutrophils Relative %: 80 %
Platelets: 691 10*3/uL — ABNORMAL HIGH (ref 150–400)
RBC: 3.44 MIL/uL — ABNORMAL LOW (ref 4.22–5.81)
RDW: 14.2 % (ref 11.5–15.5)
WBC: 17 10*3/uL — ABNORMAL HIGH (ref 4.0–10.5)

## 2015-05-08 LAB — TROPONIN I: Troponin I: <0.03 ng/mL (ref <0.031)

## 2015-05-08 LAB — BASIC METABOLIC PANEL
Anion gap: 8 (ref 5–15)
BUN: 7 mg/dL (ref 6–20)
CALCIUM: 9 mg/dL (ref 8.9–10.3)
CHLORIDE: 95 mmol/L — AB (ref 101–111)
CO2: 30 mmol/L (ref 22–32)
CREATININE: 0.83 mg/dL (ref 0.61–1.24)
GFR calc Af Amer: >60 mL/min (ref >60)
GFR calc non Af Amer: >60 mL/min (ref >60)
GLUCOSE: 106 mg/dL — AB (ref 65–99)
Potassium: 4.1 mmol/L (ref 3.5–5.1)
Sodium: 133 mmol/L — ABNORMAL LOW (ref 135–145)

## 2015-05-08 MED ORDER — IPRATROPIUM-ALBUTEROL 0.5-2.5 (3) MG/3ML IN SOLN: 3.0000 mL | Freq: Four times a day (QID) | RESPIRATORY_TRACT | Status: DC

## 2015-05-08 MED ORDER — VANCOMYCIN HCL 500 MG IV SOLR
INTRAVENOUS | Status: AC
  Filled 2015-05-08: qty 500

## 2015-05-08 MED ORDER — VANCOMYCIN HCL 500 MG IV SOLR
500.0000 mg | Freq: Three times a day (TID) | INTRAVENOUS | Status: DC
  Administered 2015-05-08 – 2015-05-10 (×6): 500 mg via INTRAVENOUS
  Filled 2015-05-08 (×7): qty 500

## 2015-05-08 MED ORDER — IOHEXOL 350 MG/ML SOLN
100.0000 mL | Freq: Once | INTRAVENOUS | Status: AC | PRN
  Administered 2015-05-08: 100 mL via INTRAVENOUS

## 2015-05-08 MED ORDER — ACETAMINOPHEN 325 MG PO TABS
650.0000 mg | ORAL_TABLET | Freq: Four times a day (QID) | ORAL | Status: DC | PRN
  Administered 2015-05-08 – 2015-05-09 (×5): 650 mg via ORAL
  Filled 2015-05-08 (×6): qty 2

## 2015-05-08 MED ORDER — IPRATROPIUM-ALBUTEROL 0.5-2.5 (3) MG/3ML IN SOLN
3.0000 mL | Freq: Four times a day (QID) | RESPIRATORY_TRACT | Status: DC
  Administered 2015-05-08 – 2015-05-12 (×17): 3 mL via RESPIRATORY_TRACT
  Filled 2015-05-08 (×16): qty 3

## 2015-05-08 MED ORDER — PIPERACILLIN-TAZOBACTAM 3.375 G IVPB
INTRAVENOUS | Status: AC
  Filled 2015-05-08: qty 50

## 2015-05-08 MED ORDER — PIPERACILLIN-TAZOBACTAM 3.375 G IVPB
3.3750 g | Freq: Three times a day (TID) | INTRAVENOUS | Status: DC
  Administered 2015-05-08 – 2015-05-14 (×16): 3.375 g via INTRAVENOUS
  Filled 2015-05-08 (×25): qty 50

## 2015-05-08 MED ORDER — ONDANSETRON HCL 4 MG/2ML IJ SOLN: 4.0000 mg | Freq: Four times a day (QID) | INTRAMUSCULAR | Status: DC | PRN

## 2015-05-08 MED ORDER — IPRATROPIUM-ALBUTEROL 0.5-2.5 (3) MG/3ML IN SOLN
3.0000 mL | RESPIRATORY_TRACT | Status: AC
  Administered 2015-05-08 (×2): 3 mL via RESPIRATORY_TRACT
  Filled 2015-05-08 (×2): qty 3

## 2015-05-08 MED ORDER — ONDANSETRON HCL 4 MG PO TABS: 4.0000 mg | ORAL_TABLET | Freq: Four times a day (QID) | ORAL | Status: DC | PRN

## 2015-05-08 MED ORDER — VANCOMYCIN HCL 500 MG IV SOLR
500.0000 mg | Freq: Once | INTRAVENOUS | Status: AC
  Administered 2015-05-08: 500 mg via INTRAVENOUS
  Filled 2015-05-08: qty 500

## 2015-05-08 MED ORDER — HYDROMORPHONE HCL 1 MG/ML IJ SOLN
1.0000 mg | INTRAMUSCULAR | Status: DC | PRN
  Administered 2015-05-09 – 2015-05-14 (×15): 1 mg via INTRAVENOUS
  Filled 2015-05-08 (×15): qty 1

## 2015-05-08 MED ORDER — ENOXAPARIN SODIUM 40 MG/0.4ML ~~LOC~~ SOLN
40.0000 mg | SUBCUTANEOUS | Status: DC
  Administered 2015-05-08 – 2015-05-11 (×4): 40 mg via SUBCUTANEOUS
  Filled 2015-05-08 (×4): qty 0.4

## 2015-05-08 MED ORDER — SODIUM CHLORIDE 0.9 % IV SOLN
INTRAVENOUS | Status: AC
  Administered 2015-05-08: 01:00:00 via INTRAVENOUS

## 2015-05-08 MED ORDER — PIPERACILLIN-TAZOBACTAM 3.375 G IVPB
3.3750 g | Freq: Once | INTRAVENOUS | Status: AC
  Administered 2015-05-08: 3.375 g via INTRAVENOUS
  Filled 2015-05-08: qty 50

## 2015-05-08 MED ORDER — ACETAMINOPHEN 650 MG RE SUPP: 650.0000 mg | Freq: Four times a day (QID) | RECTAL | Status: DC | PRN

## 2015-05-08 NOTE — ED Notes (Signed)
Pt placed on oxygen at 3 lpm via [place] with increase in pulse ox to 97%

## 2015-05-08 NOTE — ED Provider Notes (Signed)
TIME SEEN: 12:39 AM   CHIEF COMPLAINT: Shortness of Breath  HPI: Brian Nelson is a 54 y.o. male with history of tobacco use, COPD, possible lung cancer who presents to the Emergency Department complaining of gradually worsening constant SOB onset several months ago. He reports associated wheezing and mild non-productive cough. He denies any fever, vomiting, or diarrhea. Patient was admitted to the hospital on October 18 and discharged October 22. Diagnosed with right pleural effusion and underwent thoracentesis on October 19. Pt was d/c today and he reports that he was not feeling well while being discharged but states he was feeling better. He denies going home with oxygen because his 02 stats were 96 while being discharged.  Reports he was seen by oncology while in the hospital. He denies currently being on any blood thinners. He denies being on Chemo or radiation. He denies a hx of PE/DVT. He denies any chest pain. States that his shortness of breath has been present for several months. History limited as pt is a poor historian.    ROS: See HPI Constitutional: no fever  Eyes: no drainage  ENT: no runny nose   Cardiovascular:  no chest pain  Resp: SOB  GI: no vomiting GU: no dysuria Integumentary: no rash  Allergy: no hives  Musculoskeletal: no leg swelling  Neurological: no slurred speech ROS otherwise negative  PAST MEDICAL HISTORY/PAST SURGICAL HISTORY:  Past Medical History  Diagnosis Date  . Tobacco use disorder 04/23/2015    Quit Fall 2016  . ETOH abuse     Quit Fall 2016  . Illicit drug use     Quit Fall 2016    MEDICATIONS:  Prior to Admission medications   Medication Sig Start Date End Date Taking? Authorizing Provider  oxyCODONE-acetaminophen (PERCOCET/ROXICET) 5-325 MG tablet Take 1-2 tablets by mouth every 4 (four) hours as needed for moderate pain or severe pain. 05/07/15   Rosita Fire, MD    ALLERGIES:  No Known Allergies  SOCIAL HISTORY:  Social History   Substance Use Topics  . Smoking status: Former Smoker -- 2.00 packs/day for 40 years    Types: Cigarettes    Quit date: 04/22/2015  . Smokeless tobacco: Never Used  . Alcohol Use: 3.6 oz/week    6 Cans of beer per week     Comment: Quit in fall 2016    FAMILY HISTORY: Family History  Problem Relation Age of Onset  . Diabetes Mother     EXAM: BP 116/93 mmHg  Pulse 89  Temp(Src) 97.9 F (36.6 C) (Oral)  Resp 24  SpO2 97% CONSTITUTIONAL: Alert and oriented and responds appropriately to questions. Chronically ill-appearing, thin HEAD: Normocephalic EYES: Conjunctivae clear, PERRL ENT: normal nose; no rhinorrhea; moist mucous membranes; pharynx without lesions noted NECK: Supple, no meningismus, no LAD  CARD: RRR; S1 and S2 appreciated; no murmurs, no clicks, no rubs, no gallops RESP: Normal chest excursion without splinting or tachypnea; breath sounds equal bilaterally; diminished at left base and diffusely diminished throughout the right side and has scattered expiratory wheezing, no rhonchi, no rales, pt is hypoxic on RA but has no respiratory distress, speaking full sentences ABD/GI: Normal bowel sounds; non-distended; soft, non-tender, no rebound, no guarding, no peritoneal signs BACK:  The back appears normal and is non-tender to palpation, there is no CVA tenderness EXT: Normal ROM in all joints; non-tender to palpation; no edema; normal capillary refill; no cyanosis, no calf tenderness or swelling    SKIN: Normal color for age and race;  warm NEURO: Moves all extremities equally, sensation to light touch intact diffusely, cranial nerves II through XII intact PSYCH: The patient's mood and manner are appropriate. Grooming and personal hygiene are appropriate.  MEDICAL DECISION MAKING: Patient here with worsening shortness of breath for several months. Was recently admitted to the hospital for right-sided pleural effusion and underwent thoracentesis. States he was discharged  yesterday but was still feeling poorly and now is feeling worse. He is hypoxic here in the emergency department with sats of 87-88% on room air. Was not discharged on oxygen per his report. Patient is being followed by oncology for concerns for adenocarcinoma from pathology from previous pleural effusion. Per notes, patient had a lung biopsy however that was normal. He is wheezing on exam has a history of COPD. We'll give duo nebs. We'll also obtain labs and a CT of his chest to rule out pulmonary embolus, infiltrate and effusion.    ED PROGRESS: Patient's oxygen level is 97% on 3 L. He has not having any respiratory distress. Labs show extensive ptosis which is chronic. Troponin negative. CT of his chest shows a large partially loculated right pleural effusion with near complete atelectasis and consolidation of the right lung that has increase compared to exam 5 days ago. He also has multifocal peripheral groundglass opacities that represent infection versus neoplasm. Discussed with Dr. Darrick Meigs with hospitalist service who agrees on admission. Patient's PCP is Dr. Legrand Rams. Will admit to medical bed, inpatient.     EKG Interpretation  Date/Time:  Sunday May 08 2015 00:58:18 EDT Ventricular Rate:  93 PR Interval:  133 QRS Duration: 63 QT Interval:  343 QTC Calculation: 427 R Axis:   83 Text Interpretation:  Sinus rhythm LVH by voltage Anterior Q waves, possibly due to LVH No significant change since last tracing Confirmed by Nithila Sumners,  DO, Dondre Catalfamo ([id number]) on 05/08/2015 1:52:17 AM       By signing my name below, I, Emmanuella Mensah, attest that this documentation has been prepared under the direction and in the presence of [place], DO. Electronically Signed: Judithann Sauger, ED Scribe. 05/08/2015. 12:48 AM.   Delice Bison Kiani Wurtzel, DO 05/08/15 [id number]

## 2015-05-08 NOTE — H&P (Signed)
PCP:   [facility]   Chief Complaint:  Shortness of breath  HPI: 54 year old male who   has a past medical history of Tobacco use disorder (04/23/2015); ETOH abuse; and Illicit drug use. patient has history of new diagnosis of lung cancer unknown if primary or metastatic who was admitted on 05/03/2015 underwent thoracentesis and was discharged yesterday. Patient says that after he went home he became short of breath and came back to the hospital. He complains of chest tightness, no nausea vomiting or diarrhea. No fever no dysuria urgency or frequency of urination. Patient was found to be hypoxic in the ED with O2 sats in 80s on room air In the ED CT of the chest was done which showed right pleural effusion increased as compared to exam 5 days ago.  Allergies:  No Known Allergies    Past Medical History  Diagnosis Date  . Tobacco use disorder 04/23/2015    Quit Fall 2016  . ETOH abuse     Quit Fall 2016  . Illicit drug use     Quit Fall 2016    Past Surgical History  Procedure Laterality Date  . Video bronchoscopy Bilateral 04/27/2015    Procedure: VIDEO BRONCHOSCOPY WITH FLUORO;  Surgeon: Juanito Doom, MD;  Location: [facility];  Service: Cardiopulmonary;  Laterality: Bilateral;  . Hernia repair Right 2009    [facility] [place]  [facility]    Prior to Admission medications   Medication Sig Start Date End Date Taking? Authorizing Provider  oxyCODONE-acetaminophen (PERCOCET/ROXICET) 5-325 MG tablet Take 1-2 tablets by mouth every 4 (four) hours as needed for moderate pain or severe pain. 05/07/15   Rosita Fire, MD    Social History:  reports that he quit smoking about 2 weeks ago. His smoking use included Cigarettes. He has a 80 pack-year smoking history. He has never used smokeless tobacco. He reports that he drinks about 3.6 oz of alcohol per week. He reports that he uses illicit drugs (Cocaine and Marijuana).  Family History  Problem  Relation Age of Onset  . Diabetes Mother     There were no vitals filed for this visit.  All the positives are listed in BOLD  Review of Systems:  HEENT: Headache, blurred vision, runny nose, sore throat Neck: Hypothyroidism, hyperthyroidism,,lymphadenopathy Chest : Shortness of breath, history of COPD, Asthma Heart : Chest pain, history of coronary arterey disease GI:  Nausea, vomiting, diarrhea, constipation, GERD GU: Dysuria, urgency, frequency of urination, hematuria Neuro: Stroke, seizures, syncope Psych: Depression, anxiety, hallucinations   Physical Exam: Blood pressure 110/60, pulse 96, temperature 97.9 F (36.6 C), temperature source Oral, resp. rate 19, SpO2 99 %. Constitutional:   Patient is a well-developed and well-nourished male* in no acute distress and cooperative with exam. Head: Normocephalic and atraumatic Mouth: Mucus membranes moist Eyes: PERRL, EOMI, conjunctivae normal Neck: Supple, No Thyromegaly Cardiovascular: RRR, S1 normal, S2 normal Pulmonary/Chest: Bilateral wheezing Abdominal: Soft. Non-tender, non-distended, bowel sounds are normal, no masses, organomegaly, or guarding present.  Neurological: A&O x3, Strength is normal and symmetric bilaterally, cranial nerve II-XII are grossly intact, no focal motor deficit, sensory intact to light touch bilaterally.  Extremities : No Cyanosis, Clubbing or Edema  Labs on Admission:  Basic Metabolic Panel:  Recent Labs Lab 05/03/15 1814 05/05/15 0939 05/08/15 0104  NA 134* 133* 133*  K 4.4 3.9 4.1  CL 98* 97* 95*  CO2 '28 29 30  '$ GLUCOSE 93 125* 106*  BUN 12  8 7  CREATININE 0.81 0.72 0.83  CALCIUM 8.9 8.6* 9.0   Liver Function Tests:  Recent Labs Lab 05/03/15 1814 05/05/15 0939  AST 17 19  ALT 32 25  ALKPHOS 104 98  BILITOT 0.2* 0.2*  PROT 6.7 5.9*  ALBUMIN 2.5* 2.2*   No results for input(s): LIPASE, AMYLASE in the last 168 hours. No results for input(s): AMMONIA in the last 168  hours. CBC:  Recent Labs Lab 05/03/15 1814 05/05/15 0939 05/08/15 0104  WBC 14.2* 13.7* 17.0*  NEUTROABS 9.8* 9.8* 13.7*  HGB 11.6* 10.1* 10.0*  HCT 34.7* 30.7* 29.7*  MCV 87.0 88.0 86.3  PLT 726* 735* 691*   Cardiac Enzymes:  Recent Labs Lab 05/08/15 0104  TROPONINI <0.03     Radiological Exams on Admission: Ct Angio Chest Pe W/cm &/or Wo Cm  05/08/2015  CLINICAL DATA:  Hypoxia. History of lung cancer and right pleural effusion. Progressive constant shortness of breath. EXAM: CT ANGIOGRAPHY CHEST WITH CONTRAST TECHNIQUE: Multidetector CT imaging of the chest was performed using the standard protocol during bolus administration of intravenous contrast. Multiplanar CT image reconstructions and MIPs were obtained to evaluate the vascular anatomy. CONTRAST:  137m OMNIPAQUE IOHEXOL 350 MG/ML SOLN COMPARISON:  Chest CT 5 days prior 05/03/2015 FINDINGS: There are no filling defects within the pulmonary arteries to suggest pulmonary embolus. Large partially loculated right pleural effusion has increased in size from prior. This causes near complete atelectasis of the right lower lobe. There is near complete atelectasis of the right upper lobe with small aerated portion anteriorly. Complete atelectasis of the right middle lobe. Aerated lung demonstrates ground-glass and consolidation. There is minimal leftward shift in mediastinal structures related to large pleural effusion. Large left apical bulla measures at least 12 cm, unchanged. Advanced emphysema throughout the remainder of the left lung. Multifocal ground-glass opacities in the periphery of the left lung, some of which are nodular. Enlarged left infrahilar lymph nodes, with mild interval increase in size from prior. No definite left pleural effusion. No pericardial effusion. Unchanged enlarged paratracheal lymph nodes. These are partially obscured by adjacent pleural fluid. The heart size is normal. Thoracic aorta is normal in caliber  with scattered atherosclerosis. Right epicardial/pericardial lymph nodes changed. Evaluation of the upper abdomen demonstrates no significant change from prior. Mild whole body wall and mesenteric edema. There are no acute or suspicious osseous abnormalities. 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 Review of the MIP images confirms the above findings. IMPRESSION: 1. No pulmonary embolus. 2. Large partially loculated right pleural effusion with near complete atelectasis and consolidation of the right lung. The pleural effusion has increased in size compared to exam 5 days prior. 3. Advanced emphysema with large bulla in the left upper lobe. Multifocal peripheral ground-glass opacities, some of which appear nodular are unchanged from prior exam. Infection versus neoplasm. There is left infrahilar adenopathy. Electronically Signed   By: MJeb LeveringM.D.   On: 05/08/2015 02:46    EKG: Independently reviewed. *Normal sinus rhythm   Assessment/Plan Active Problems:   Leukocytosis   Recurrent pleural effusion on right   Pleural effusion  Recurrent pleural effusion Will admit the patient, consult IR for thoracentesis Continue supplemental oxygen Patient might benefit from Pleurx catheter  Leukocytosis CT chest shows multifocal peripheral groundglass opacities which are unchanged from previous exam. Infectious versus neoplasm Will empirically start the patient on vancomycin and Zosyn for possible infectious cause Obtain blood cultures 2  DVT prophylaxis Lovenox  Code  status: Full code  Family discussion: Admission, patients condition and plan of care including tests being ordered have been discussed with the patient and his relatives at bedside* who indicate understanding and agree with the plan and Code Status.   Time Spent on Admission: 60 min  [facility] Pager: [phone] 05/08/2015, 3:30 AM  If 7PM-7AM, please contact  night-coverage  [email or web address]  Password [email or web address]

## 2015-05-08 NOTE — Progress Notes (Signed)
Patient on floor 3A, doing well neb held to start in am.

## 2015-05-08 NOTE — Progress Notes (Signed)
ANTIBIOTIC CONSULT NOTE-  Pharmacy Consult for Vancomycin and Zosyn Indication: Pneumonia  No Known Allergies  Patient Measurements: Height: '5\' 8"'$  (172.7 cm) Weight: 131 lb 14.4 oz (59.829 kg) IBW/kg (Calculated) : 68.4   Vital Signs: Temp: 98.3 F (36.8 C) (10/23 0408) Temp Source: Oral (10/23 0408) BP: 131/101 mmHg (10/23 0408) Pulse Rate: 97 (10/23 0408)  Labs:  Recent Labs  05/05/15 0939 05/08/15 0104  WBC 13.7* 17.0*  HGB 10.1* 10.0*  PLT 735* 691*  CREATININE 0.72 0.83    Estimated Creatinine Clearance: 88.1 mL/min (by C-G formula based on Cr of 0.83).  No results for input(s): VANCOTROUGH, VANCOPEAK, VANCORANDOM, GENTTROUGH, GENTPEAK, GENTRANDOM, TOBRATROUGH, TOBRAPEAK, TOBRARND, AMIKACINPEAK, AMIKACINTROU, AMIKACIN in the last 72 hours.   Microbiology: Recent Results (from the past 720 hour(s))  Culture, body fluid-bottle     Status: None   Collection Time: 04/23/15 11:18 AM  Result Value Ref Range Status   Specimen Description FLUID PLEURAL RIGHT  Final   Special Requests NONE  Final   Culture NO GROWTH 5 DAYS  Final   Report Status 04/28/2015 FINAL  Final  Gram stain     Status: None   Collection Time: 04/23/15 11:18 AM  Result Value Ref Range Status   Specimen Description FLUID PLEURAL RIGHT  Final   Special Requests NONE  Final   Gram Stain   Final    FEW WBC PRESENT, PREDOMINANTLY MONONUCLEAR NO ORGANISMS SEEN    Report Status 04/23/2015 FINAL  Final  Culture, respiratory (NON-Expectorated)     Status: None   Collection Time: 04/27/15  9:25 AM  Result Value Ref Range Status   Specimen Description BRONCHIAL ALVEOLAR LAVAGE  Final   Special Requests Normal  Final   Gram Stain   Final    FEW WBC PRESENT,BOTH PMN AND MONONUCLEAR NO SQUAMOUS EPITHELIAL CELLS SEEN NO ORGANISMS SEEN Performed at [facility]    Culture   Final    NO GROWTH 2 DAYS Performed at [facility]    Report Status 04/29/2015 FINAL  Final  Fungus  Culture with Smear     Status: None (Preliminary result)   Collection Time: 04/27/15  9:25 AM  Result Value Ref Range Status   Specimen Description BRONCHIAL ALVEOLAR LAVAGE  Final   Special Requests Normal  Final   Fungal Smear   Final    NO YEAST OR FUNGAL ELEMENTS SEEN Performed at [facility]    Culture   Final    CULTURE IN PROGRESS FOR FOUR WEEKS Performed at [facility]    Report Status PENDING  Incomplete  AFB culture with smear     Status: None (Preliminary result)   Collection Time: 04/27/15  9:25 AM  Result Value Ref Range Status   Specimen Description BRONCHIAL ALVEOLAR LAVAGE  Final   Special Requests Normal  Final   Acid Fast Smear   Final    NO ACID FAST BACILLI SEEN Performed at [facility]    Culture   Final    CULTURE WILL BE EXAMINED FOR 6 WEEKS BEFORE ISSUING A FINAL REPORT Performed at [facility]    Report Status PENDING  Incomplete  Culture, blood (routine x 2)     Status: None (Preliminary result)   Collection Time: 05/08/15  3:45 AM  Result Value Ref Range Status   Specimen Description RIGHT ANTECUBITAL  Final   Special Requests BOTTLES DRAWN AEROBIC AND ANAEROBIC [facility]  Final   Culture PENDING  Incomplete  Report Status PENDING  Incomplete  Culture, blood (routine x 2)     Status: None (Preliminary result)   Collection Time: 05/08/15  3:55 AM  Result Value Ref Range Status   Specimen Description BLOOD RIGHT ARM  Final   Special Requests BOTTLES DRAWN AEROBIC AND ANAEROBIC 6CC  Final   Culture PENDING  Incomplete   Report Status PENDING  Incomplete    Medical History: Past Medical History  Diagnosis Date  . Tobacco use disorder 04/23/2015    Quit Fall 2016  . ETOH abuse     Quit Fall 2016  . Illicit drug use     Quit Fall 2016    Medications:   Assessment: 54 yo male recently discharged from hospital with new lung cancer diagnosis. Pt returns with complaints of increased SOB. CT showed right pleural  effusion. Empiric antibiotics for pneumonia vs neoplasm. Zosyn 3.375 GM IV x 1 and Vancomycin 500 mg IV x 1 given  Goal of Therapy:  Vancomycin troughs 15-20 mcg/ml Eradicated infection  Plan:  Vancomycin 500 mg IV every 8 hours Zosyn 3.375 GM IV every 8 hours Vancomycin trough at steady state Labs per protocol  Abner Greenspan, [place], [place] 05/08/2015,7:33 AM

## 2015-05-08 NOTE — Progress Notes (Signed)
ANTIBIOTIC CONSULT NOTE-Preliminary  Pharmacy Consult for Vancomycin and Zosyn Indication: Pneumonia  No Known Allergies  Patient Measurements: Height: '5\' 8"'$  (172.7 cm) Weight: 131 lb 14.4 oz (59.829 kg) IBW/kg (Calculated) : 68.4   Vital Signs: Temp: 98.3 F (36.8 C) (10/23 0408) Temp Source: Oral (10/23 0408) BP: 131/101 mmHg (10/23 0408) Pulse Rate: 97 (10/23 0408)  Labs:  Recent Labs  05/05/15 0939 05/08/15 0104  WBC 13.7* 17.0*  HGB 10.1* 10.0*  PLT 735* 691*  CREATININE 0.72 0.83    Estimated Creatinine Clearance: 88.1 mL/min (by C-G formula based on Cr of 0.83).  No results for input(s): VANCOTROUGH, VANCOPEAK, VANCORANDOM, GENTTROUGH, GENTPEAK, GENTRANDOM, TOBRATROUGH, TOBRAPEAK, TOBRARND, AMIKACINPEAK, AMIKACINTROU, AMIKACIN in the last 72 hours.   Microbiology: Recent Results (from the past 720 hour(s))  Culture, body fluid-bottle     Status: None   Collection Time: 04/23/15 11:18 AM  Result Value Ref Range Status   Specimen Description FLUID PLEURAL RIGHT  Final   Special Requests NONE  Final   Culture NO GROWTH 5 DAYS  Final   Report Status 04/28/2015 FINAL  Final  Gram stain     Status: None   Collection Time: 04/23/15 11:18 AM  Result Value Ref Range Status   Specimen Description FLUID PLEURAL RIGHT  Final   Special Requests NONE  Final   Gram Stain   Final    FEW WBC PRESENT, PREDOMINANTLY MONONUCLEAR NO ORGANISMS SEEN    Report Status 04/23/2015 FINAL  Final  Culture, respiratory (NON-Expectorated)     Status: None   Collection Time: 04/27/15  9:25 AM  Result Value Ref Range Status   Specimen Description BRONCHIAL ALVEOLAR LAVAGE  Final   Special Requests Normal  Final   Gram Stain   Final    FEW WBC PRESENT,BOTH PMN AND MONONUCLEAR NO SQUAMOUS EPITHELIAL CELLS SEEN NO ORGANISMS SEEN Performed at [facility]    Culture   Final    NO GROWTH 2 DAYS Performed at [facility]    Report Status 04/29/2015 FINAL  Final   Fungus Culture with Smear     Status: None (Preliminary result)   Collection Time: 04/27/15  9:25 AM  Result Value Ref Range Status   Specimen Description BRONCHIAL ALVEOLAR LAVAGE  Final   Special Requests Normal  Final   Fungal Smear   Final    NO YEAST OR FUNGAL ELEMENTS SEEN Performed at [facility]    Culture   Final    CULTURE IN PROGRESS FOR FOUR WEEKS Performed at [facility]    Report Status PENDING  Incomplete  AFB culture with smear     Status: None (Preliminary result)   Collection Time: 04/27/15  9:25 AM  Result Value Ref Range Status   Specimen Description BRONCHIAL ALVEOLAR LAVAGE  Final   Special Requests Normal  Final   Acid Fast Smear   Final    NO ACID FAST BACILLI SEEN Performed at [facility]    Culture   Final    CULTURE WILL BE EXAMINED FOR 6 WEEKS BEFORE ISSUING A FINAL REPORT Performed at [facility]    Report Status PENDING  Incomplete    Medical History: Past Medical History  Diagnosis Date  . Tobacco use disorder 04/23/2015    Quit Fall 2016  . ETOH abuse     Quit Fall 2016  . Illicit drug use     Quit Fall 2016    Medications:   Assessment: 54  yo male recently discharged from hospital with new lung cancer diagnosis. Pt returns with complaints of increased SOB. CT showed right pleural effusion. Empiric antibiotics for pneumonia vs neoplasm.  Goal of Therapy:  Vancomycin troughs 15-20 mcg/ml Eradicated infection  Plan:  Preliminary review of pertinent patient information completed.  Protocol will be initiated with one-time doses of Zosyn 3.375 Gm IV and Vancomycin 500 mg IV.  Forestine Na clinical pharmacist will complete review during morning rounds to assess patient and finalize treatment regimen.  Norberto Sorenson, [facility] 05/08/2015,4:34 AM

## 2015-05-08 NOTE — ED Notes (Signed)
Pt states that he was just discharged from the hospital today for chest pain, sob, reports that he is not breathing any better,

## 2015-05-08 NOTE — Progress Notes (Signed)
Subjective: Patient was readmitted due to shortness of breath. His pleural effusion is re accumulated. He has leucocytosis. He is started on treatment for pneumonia.  Objective: Vital signs in last 24 hours: Temp:  [97.9 F (36.6 C)-98.3 F (36.8 C)] 98.3 F (36.8 C) (10/23 0408) Pulse Rate:  [89-101] 97 (10/23 0408) Resp:  [16-24] 18 (10/23 0408) BP: (109-136)/(60-101) 131/101 mmHg (10/23 0408) SpO2:  [88 %-100 %] 95 % (10/23 0911) Weight:  [59.829 kg (131 lb 14.4 oz)] 59.829 kg (131 lb 14.4 oz) (10/23 0408) Weight change:  Last BM Date: 05/07/15  Intake/Output from previous day: 10/22 0701 - 10/23 0700 In: -  Out: 200 [Urine:200]  PHYSICAL EXAM General appearance: cachectic and no distress Resp: diminished breath sounds bilaterally and rhonchi bilaterally Cardio: S1, S2 normal GI: soft, non-tender; bowel sounds normal; no masses,  no organomegaly Extremities: extremities normal, atraumatic, no cyanosis or edema  Lab Results:  Results for orders placed or performed during the hospital encounter of 05/07/15 (from the past 48 hour(s))  CBC with Differential     Status: Abnormal   Collection Time: 05/08/15  1:04 AM  Result Value Ref Range   WBC 17.0 (H) 4.0 - 10.5 K/uL   RBC 3.44 (L) 4.22 - 5.81 MIL/uL   Hemoglobin 10.0 (L) 13.0 - 17.0 g/dL   HCT 52.0 (L) 81.5 - 86.8 %   MCV 86.3 78.0 - 100.0 fL   MCH 29.1 26.0 - 34.0 pg   MCHC 33.7 30.0 - 36.0 g/dL   RDW 51.8 79.9 - 11.7 %   Platelets 691 (H) 150 - 400 K/uL   Neutrophils Relative % 80 %   Neutro Abs 13.7 (H) 1.7 - 7.7 K/uL   Lymphocytes Relative 8 %   Lymphs Abs 1.4 0.7 - 4.0 K/uL   Monocytes Relative 9 %   Monocytes Absolute 1.4 (H) 0.1 - 1.0 K/uL   Eosinophils Relative 2 %   Eosinophils Absolute 0.4 0.0 - 0.7 K/uL   Basophils Relative 1 %   Basophils Absolute 0.1 0.0 - 0.1 K/uL  Basic metabolic panel     Status: Abnormal   Collection Time: 05/08/15  1:04 AM  Result Value Ref Range   Sodium 133 (L) 135 - 145  mmol/L   Potassium 4.1 3.5 - 5.1 mmol/L   Chloride 95 (L) 101 - 111 mmol/L   CO2 30 22 - 32 mmol/L   Glucose, Bld 106 (H) 65 - 99 mg/dL   BUN 7 6 - 20 mg/dL   Creatinine, Ser 9.94 0.61 - 1.24 mg/dL   Calcium 9.0 8.9 - 05.0 mg/dL   GFR calc non Af Amer >60 >60 mL/min   GFR calc Af Amer >60 >60 mL/min    Comment: (NOTE) The eGFR has been calculated using the CKD EPI equation. This calculation has not been validated in all clinical situations. eGFR's persistently <60 mL/min signify possible Chronic Kidney Disease.    Anion gap 8 5 - 15  Troponin I     Status: None   Collection Time: 05/08/15  1:04 AM  Result Value Ref Range   Troponin I <0.03 <0.031 ng/mL    Comment:        NO INDICATION OF MYOCARDIAL INJURY.   Culture, blood (routine x 2)     Status: None (Preliminary result)   Collection Time: 05/08/15  3:45 AM  Result Value Ref Range   Specimen Description RIGHT ANTECUBITAL    Special Requests BOTTLES DRAWN AEROBIC AND ANAEROBIC 6CC  Culture NO GROWTH < 12 HOURS    Report Status PENDING   Culture, blood (routine x 2)     Status: None (Preliminary result)   Collection Time: 05/08/15  3:55 AM  Result Value Ref Range   Specimen Description BLOOD RIGHT ARM    Special Requests BOTTLES DRAWN AEROBIC AND ANAEROBIC 6CC    Culture NO GROWTH < 12 HOURS    Report Status PENDING     ABGS No results for input(s): PHART, PO2ART, TCO2, HCO3 in the last 72 hours.  Invalid input(s): PCO2 CULTURES Recent Results (from the past 240 hour(s))  Culture, blood (routine x 2)     Status: None (Preliminary result)   Collection Time: 05/08/15  3:45 AM  Result Value Ref Range Status   Specimen Description RIGHT ANTECUBITAL  Final   Special Requests BOTTLES DRAWN AEROBIC AND ANAEROBIC 6CC  Final   Culture NO GROWTH < 12 HOURS  Final   Report Status PENDING  Incomplete  Culture, blood (routine x 2)     Status: None (Preliminary result)   Collection Time: 05/08/15  3:55 AM  Result Value  Ref Range Status   Specimen Description BLOOD RIGHT ARM  Final   Special Requests BOTTLES DRAWN AEROBIC AND ANAEROBIC 6CC  Final   Culture NO GROWTH < 12 HOURS  Final   Report Status PENDING  Incomplete   Studies/Results: Ct Angio Chest Pe W/cm &/or Wo Cm  05/08/2015  CLINICAL DATA:  Hypoxia. History of lung cancer and right pleural effusion. Progressive constant shortness of breath. EXAM: CT ANGIOGRAPHY CHEST WITH CONTRAST TECHNIQUE: Multidetector CT imaging of the chest was performed using the standard protocol during bolus administration of intravenous contrast. Multiplanar CT image reconstructions and MIPs were obtained to evaluate the vascular anatomy. CONTRAST:  157mL OMNIPAQUE IOHEXOL 350 MG/ML SOLN COMPARISON:  Chest CT 5 days prior 05/03/2015 FINDINGS: There are no filling defects within the pulmonary arteries to suggest pulmonary embolus. Large partially loculated right pleural effusion has increased in size from prior. This causes near complete atelectasis of the right lower lobe. There is near complete atelectasis of the right upper lobe with small aerated portion anteriorly. Complete atelectasis of the right middle lobe. Aerated lung demonstrates ground-glass and consolidation. There is minimal leftward shift in mediastinal structures related to large pleural effusion. Large left apical bulla measures at least 12 cm, unchanged. Advanced emphysema throughout the remainder of the left lung. Multifocal ground-glass opacities in the periphery of the left lung, some of which are nodular. Enlarged left infrahilar lymph nodes, with mild interval increase in size from prior. No definite left pleural effusion. No pericardial effusion. Unchanged enlarged paratracheal lymph nodes. These are partially obscured by adjacent pleural fluid. The heart size is normal. Thoracic aorta is normal in caliber with scattered atherosclerosis. Right epicardial/pericardial lymph nodes changed. Evaluation of the upper  abdomen demonstrates no significant change from prior. Mild whole body wall and mesenteric edema. There are no acute or suspicious osseous abnormalities. 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 Review of the MIP images confirms the above findings. IMPRESSION: 1. No pulmonary embolus. 2. Large partially loculated right pleural effusion with near complete atelectasis and consolidation of the right lung. The pleural effusion has increased in size compared to exam 5 days prior. 3. Advanced emphysema with large bulla in the left upper lobe. Multifocal peripheral ground-glass opacities, some of which appear nodular are unchanged from prior exam. Infection versus neoplasm. There is left infrahilar adenopathy. Electronically  Signed   By: Jeb Levering M.D.   On: 05/08/2015 02:46    Medications: I have reviewed the patient's current medications.  Assesment:   Active Problems:   Leukocytosis   Recurrent pleural effusion on right   Pleural effusion R/O pneumonia  (health care associated) COPD   Plan:  Medications reviewed Will continue IV antibiotics Discussed with Dr. Arnoldo Morale for [facility] Cath placement Pulmonary consult patient is planned for thoracentesis.     LOS: 0 days   Dartanion Teo 05/08/2015, 9:29 AM

## 2015-05-08 NOTE — Progress Notes (Signed)
Patient in CT

## 2015-05-09 ENCOUNTER — Inpatient Hospital Stay (HOSPITAL_COMMUNITY): Payer: Medicaid Other

## 2015-05-09 ENCOUNTER — Encounter (HOSPITAL_COMMUNITY): Payer: Self-pay

## 2015-05-09 LAB — BASIC METABOLIC PANEL
Anion gap: 7 (ref 5–15)
BUN: 6 mg/dL (ref 6–20)
CALCIUM: 8.8 mg/dL — AB (ref 8.9–10.3)
CO2: 30 mmol/L (ref 22–32)
Chloride: 98 mmol/L — ABNORMAL LOW (ref 101–111)
Creatinine, Ser: 0.73 mg/dL (ref 0.61–1.24)
GFR calc non Af Amer: >60 mL/min (ref >60)
GLUCOSE: 100 mg/dL — AB (ref 65–99)
POTASSIUM: 3.9 mmol/L (ref 3.5–5.1)
SODIUM: 135 mmol/L (ref 135–145)

## 2015-05-09 LAB — CBC
HCT: 26.8 % — ABNORMAL LOW (ref 39.0–52.0)
HEMOGLOBIN: 8.8 g/dL — AB (ref 13.0–17.0)
MCH: 28.2 pg (ref 26.0–34.0)
MCHC: 32.8 g/dL (ref 30.0–36.0)
MCV: 85.9 fL (ref 78.0–100.0)
Platelets: 669 10*3/uL — ABNORMAL HIGH (ref 150–400)
RBC: 3.12 MIL/uL — AB (ref 4.22–5.81)
RDW: 14.3 % (ref 11.5–15.5)
WBC: 13 10*3/uL — ABNORMAL HIGH (ref 4.0–10.5)

## 2015-05-09 NOTE — Procedures (Signed)
PreOperative Dx: Recurrent RIGHT pleural effusion Postoperative Dx: Recurrent RIGHT pleural effusion Procedure:   US guided RIGHT thoracentesis Radiologist:  Thornton Papas Anesthesia:  8 ml of 1% lidocaine Specimen:  1500 ml of dark old bloody fluid colored fluid EBL:   < 1 ml Complications: None

## 2015-05-09 NOTE — Op Note (Signed)
[facility] [address] [place], [address]   ENDOSCOPY PROCEDURE REPORT  PATIENT: Brian Nelson, Brian Nelson  MR#: [id number] BIRTHDATE: June 16, 1961 , 54  yrs. old GENDER: male  ENDOSCOPIST: Danie Binder, MD REFERRED HK:[id number] Kefalas, P.A.  PROCEDURE DATE: 2015/05/09 PROCEDURE:   EGD, diagnostic  INDICATIONS:Metastatic adenocarcinoma unknown primary. MEDICATIONS: Monitored anesthesia care TOPICAL ANESTHETIC:   Viscous Xylocaine ASA CLASS:  DESCRIPTION OF PROCEDURE:     Physical exam was performed.  Informed consent was obtained from the patient after explaining the benefits, risks, and alternatives to the procedure.  The patient was connected to the monitor and placed in the left lateral position.  Continuous oxygen was provided by nasal cannula and IV medicine administered through an indwelling cannula.  After administration of sedation, the patients esophagus was intubated and the     endoscope was advanced under direct visualization to the second portion of the duodenum.  The scope was removed slowly by carefully examining the color, texture, anatomy, and integrity of the mucosa on the way out.  The patient was recovered in endoscopy and discharged home in satisfactory condition.  Estimated blood loss is zero unless otherwise noted in this procedure report.     ESOPHAGUS: The mucosa of the esophagus appeared normal.   STOMACH: A medium sized hiatal hernia was noted.   The stomach otherwise appeared normal.   DUODENUM: The duodenal mucosa showed no abnormalities in the bulb and 2nd part of the duodenum. COMPLICATIONS: There were no immediate complications.  ENDOSCOPIC IMPRESSION: 1.   NO GI SOURCE FOR ADENOACARCINOMA IDENTIFIED 2.   Medium sized hiatal hernia  RECOMMENDATIONS: HEART HEALTHY DIET PORTACATH ON MON  REPEAT EXAM:   _______________________________ eSignedDanie Binder, MD 09-May-2015 2:29 PM     CPT CODES: ICD CODES:  The ICD  and CPT codes recommended by this software are interpretations from the data that the clinical staff has captured with the software.  The verification of the translation of this report to the ICD and CPT codes and modifiers is the sole responsibility of the health care institution and practicing physician where this report was generated.  [employer] will not be held responsible for the validity of the ICD and CPT codes included on this report.  AMA assumes no liability for data contained or not contained herein. CPT is a Designer, television/film set of the [employer].

## 2015-05-09 NOTE — Progress Notes (Signed)
Thoracentesis complete no signs of distress. 1500 ml dark red colored bloody pleural fluid removed.

## 2015-05-09 NOTE — Progress Notes (Signed)
Patient is having early signs of breathing problems from RT sided pleural effusion. RT posterior sounds are absent. Oxygen increased to 4lpm [place] after breathing treatment.

## 2015-05-09 NOTE — Progress Notes (Signed)
Patient ID: Brian Nelson, male   DOB: [birth year], 54 y.o.   MRN: [id number]   Pt with recurrent malignant Rt pleural effusion [place] 10/8: 1.4 L            10/24:  1.5 L Now scheduled for R pleurX catheter placement 10/27 in IR at [facility] Approved with Dr Earleen Newport Pt to be at [place] 1100 am via ambulance 10/27 IR PA will place all orders day prior to procedure  Most recent thora 10/24: must give time to re accumulate to be able to perform procedure We will keep npo; Hold Lovenox....  Pt will return too [facility] after procedure 05/12/15

## 2015-05-09 NOTE — Care Management Note (Signed)
Case Management Note  Patient Details  Name: Brian Nelson MRN: [id number] Date of Birth: April 14, 1961  Subjective/Objective:                  Pt admitted from home with recurrent pleural effusions. Pt lives with his mother and will return home at discharge. Pt is independent with ADL's. Pt did not qualify for home O2 on discharge on 05/07/15.   Action/Plan: Will continue to follow for discharge planning needs. Will need home O2 assessment prior to discharge.   Expected Discharge Date:                  Expected Discharge Plan:  Home/Self Care  In-House Referral:  NA  Discharge planning Services  CM Consult  Post Acute Care Choice:  NA Choice offered to:  NA  DME Arranged:    DME Agency:     HH Arranged:    HH Agency:     Status of Service:  In process, will continue to follow  Medicare Important Message Given:    Date Medicare IM Given:    Medicare IM give by:    Date Additional Medicare IM Given:    Additional Medicare Important Message give by:     If discussed at [place] of Stay Meetings, dates discussed:    Additional Comments:  Joylene Draft, RN 05/09/2015, 2:03 PM

## 2015-05-09 NOTE — Consult Note (Signed)
Full note to follow. He has rapidly reaccumulated pleural fluid. He may need pleura VAC to be placed at least until he gets started on treatment for his cancer and see if the treatment reduces his pleural fluid accumulation. I will discuss with interventional radiology

## 2015-05-09 NOTE — Consult Note (Signed)
Readmission of patient noted. Will place Port-A-Cath on 05/11/2015. Risks and benefits of the procedure were fully explained to the patient, who gave informed consent.

## 2015-05-09 NOTE — Progress Notes (Signed)
Subjective: Patient is complaining of shortness of breath. He is planned for ultrasound guided thoracenthesis. Patient on neb and IV antibiotics..  Objective: Vital signs in last 24 hours: Temp:  [97.9 F (36.6 C)-98.5 F (36.9 C)] 98.5 F (36.9 C) (10/24 0504) Pulse Rate:  [81-95] 93 (10/24 0744) Resp:  [17-18] 18 (10/24 0744) BP: (112-124)/(63-74) 124/74 mmHg (10/24 0504) SpO2:  [89 %-98 %] 97 % (10/24 0744) FiO2 (%):  [21 %] 21 % (10/23 1518) Weight change:  Last BM Date: 05/07/15  Intake/Output from previous day: 10/23 0701 - 10/24 0700 In: 630 [P.O.:480; IV Piggyback:150] Out: 1625 [Urine:1625]  PHYSICAL EXAM General appearance: cachectic and no distress Resp: diminished breath sounds bilaterally and rhonchi bilaterally Cardio: S1, S2 normal GI: soft, non-tender; bowel sounds normal; no masses,  no organomegaly Extremities: extremities normal, atraumatic, no cyanosis or edema  Lab Results:  Results for orders placed or performed during the hospital encounter of 05/07/15 (from the past 48 hour(s))  CBC with Differential     Status: Abnormal   Collection Time: 05/08/15  1:04 AM  Result Value Ref Range   WBC 17.0 (H) 4.0 - 10.5 K/uL   RBC 3.44 (L) 4.22 - 5.81 MIL/uL   Hemoglobin 10.0 (L) 13.0 - 17.0 g/dL   HCT 29.7 (L) 39.0 - 52.0 %   MCV 86.3 78.0 - 100.0 fL   MCH 29.1 26.0 - 34.0 pg   MCHC 33.7 30.0 - 36.0 g/dL   RDW 14.2 11.5 - 15.5 %   Platelets 691 (H) 150 - 400 K/uL   Neutrophils Relative % 80 %   Neutro Abs 13.7 (H) 1.7 - 7.7 K/uL   Lymphocytes Relative 8 %   Lymphs Abs 1.4 0.7 - 4.0 K/uL   Monocytes Relative 9 %   Monocytes Absolute 1.4 (H) 0.1 - 1.0 K/uL   Eosinophils Relative 2 %   Eosinophils Absolute 0.4 0.0 - 0.7 K/uL   Basophils Relative 1 %   Basophils Absolute 0.1 0.0 - 0.1 K/uL  Basic metabolic panel     Status: Abnormal   Collection Time: 05/08/15  1:04 AM  Result Value Ref Range   Sodium 133 (L) 135 - 145 mmol/L   Potassium 4.1 3.5 - 5.1  mmol/L   Chloride 95 (L) 101 - 111 mmol/L   CO2 30 22 - 32 mmol/L   Glucose, Bld 106 (H) 65 - 99 mg/dL   BUN 7 6 - 20 mg/dL   Creatinine, Ser 0.83 0.61 - 1.24 mg/dL   Calcium 9.0 8.9 - 10.3 mg/dL   GFR calc non Af Amer >60 >60 mL/min   GFR calc Af Amer >60 >60 mL/min    Comment: (NOTE) The eGFR has been calculated using the CKD EPI equation. This calculation has not been validated in all clinical situations. eGFR's persistently <60 mL/min signify possible Chronic Kidney Disease.    Anion gap 8 5 - 15  Troponin I     Status: None   Collection Time: 05/08/15  1:04 AM  Result Value Ref Range   Troponin I <0.03 <0.031 ng/mL    Comment:        NO INDICATION OF MYOCARDIAL INJURY.   Culture, blood (routine x 2)     Status: None (Preliminary result)   Collection Time: 05/08/15  3:45 AM  Result Value Ref Range   Specimen Description RIGHT ANTECUBITAL    Special Requests BOTTLES DRAWN AEROBIC AND ANAEROBIC 6CC    Culture NO GROWTH < 12 HOURS  Report Status PENDING   Culture, blood (routine x 2)     Status: None (Preliminary result)   Collection Time: 05/08/15  3:55 AM  Result Value Ref Range   Specimen Description BLOOD RIGHT ARM    Special Requests BOTTLES DRAWN AEROBIC AND ANAEROBIC 6CC    Culture NO GROWTH < 12 HOURS    Report Status PENDING     ABGS No results for input(s): PHART, PO2ART, TCO2, HCO3 in the last 72 hours.  Invalid input(s): PCO2 CULTURES Recent Results (from the past 240 hour(s))  Culture, blood (routine x 2)     Status: None (Preliminary result)   Collection Time: 05/08/15  3:45 AM  Result Value Ref Range Status   Specimen Description RIGHT ANTECUBITAL  Final   Special Requests BOTTLES DRAWN AEROBIC AND ANAEROBIC 6CC  Final   Culture NO GROWTH < 12 HOURS  Final   Report Status PENDING  Incomplete  Culture, blood (routine x 2)     Status: None (Preliminary result)   Collection Time: 05/08/15  3:55 AM  Result Value Ref Range Status   Specimen  Description BLOOD RIGHT ARM  Final   Special Requests BOTTLES DRAWN AEROBIC AND ANAEROBIC 6CC  Final   Culture NO GROWTH < 12 HOURS  Final   Report Status PENDING  Incomplete   Studies/Results: Ct Angio Chest Pe W/cm &/or Wo Cm  05/08/2015  CLINICAL DATA:  Hypoxia. History of lung cancer and right pleural effusion. Progressive constant shortness of breath. EXAM: CT ANGIOGRAPHY CHEST WITH CONTRAST TECHNIQUE: Multidetector CT imaging of the chest was performed using the standard protocol during bolus administration of intravenous contrast. Multiplanar CT image reconstructions and MIPs were obtained to evaluate the vascular anatomy. CONTRAST:  122mL OMNIPAQUE IOHEXOL 350 MG/ML SOLN COMPARISON:  Chest CT 5 days prior 05/03/2015 FINDINGS: There are no filling defects within the pulmonary arteries to suggest pulmonary embolus. Large partially loculated right pleural effusion has increased in size from prior. This causes near complete atelectasis of the right lower lobe. There is near complete atelectasis of the right upper lobe with small aerated portion anteriorly. Complete atelectasis of the right middle lobe. Aerated lung demonstrates ground-glass and consolidation. There is minimal leftward shift in mediastinal structures related to large pleural effusion. Large left apical bulla measures at least 12 cm, unchanged. Advanced emphysema throughout the remainder of the left lung. Multifocal ground-glass opacities in the periphery of the left lung, some of which are nodular. Enlarged left infrahilar lymph nodes, with mild interval increase in size from prior. No definite left pleural effusion. No pericardial effusion. Unchanged enlarged paratracheal lymph nodes. These are partially obscured by adjacent pleural fluid. The heart size is normal. Thoracic aorta is normal in caliber with scattered atherosclerosis. Right epicardial/pericardial lymph nodes changed. Evaluation of the upper abdomen demonstrates no  significant change from prior. Mild whole body wall and mesenteric edema. There are no acute or suspicious osseous abnormalities. 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 Review of the MIP images confirms the above findings. IMPRESSION: 1. No pulmonary embolus. 2. Large partially loculated right pleural effusion with near complete atelectasis and consolidation of the right lung. The pleural effusion has increased in size compared to exam 5 days prior. 3. Advanced emphysema with large bulla in the left upper lobe. Multifocal peripheral ground-glass opacities, some of which appear nodular are unchanged from prior exam. Infection versus neoplasm. There is left infrahilar adenopathy. Electronically Signed   By: Fonnie Birkenhead.D.  On: 05/08/2015 02:46    Medications: I have reviewed the patient's current medications.  Assesment:   Active Problems:   Leukocytosis   Recurrent pleural effusion on right   Pleural effusion R/O pneumonia  (health care associated) COPD   Plan:  Medications reviewed Will continue IV antibiotics Continue nebulizer treatment Pulmonary consult patient is planned for thoracentesis.     LOS: 1 day   Brian Nelson 05/09/2015, 7:56 AM

## 2015-05-10 LAB — TYPE AND SCREEN
ABO/RH(D): A POS
Antibody Screen: NEGATIVE

## 2015-05-10 LAB — RETICULOCYTES
RBC.: 3.07 MIL/uL — AB (ref 4.22–5.81)
RETIC COUNT ABSOLUTE: 43 10*3/uL (ref 19.0–186.0)
RETIC CT PCT: 1.4 % (ref 0.4–3.1)

## 2015-05-10 LAB — IRON AND TIBC
Iron: 11 ug/dL — ABNORMAL LOW (ref 45–182)
SATURATION RATIOS: 6 % — AB (ref 17.9–39.5)
TIBC: 197 ug/dL — ABNORMAL LOW (ref 250–450)
UIBC: 186 ug/dL

## 2015-05-10 LAB — VITAMIN B12: VITAMIN B 12: 763 pg/mL (ref 180–914)

## 2015-05-10 LAB — FOLATE: FOLATE: 16.8 ng/mL (ref >5.9)

## 2015-05-10 LAB — VANCOMYCIN, TROUGH: VANCOMYCIN TR: 11 ug/mL (ref 10.0–20.0)

## 2015-05-10 LAB — FERRITIN: Ferritin: 104 ng/mL (ref 24–336)

## 2015-05-10 MED ORDER — VANCOMYCIN HCL IN DEXTROSE 1-5 GM/200ML-% IV SOLN
1000.0000 mg | Freq: Once | INTRAVENOUS | Status: AC
  Administered 2015-05-10: 1000 mg via INTRAVENOUS
  Filled 2015-05-10: qty 200

## 2015-05-10 MED ORDER — CHLORHEXIDINE GLUCONATE 4 % EX LIQD
1.0000 "application " | Freq: Once | CUTANEOUS | Status: AC
  Administered 2015-05-10: 1 via TOPICAL
  Filled 2015-05-10: qty 15

## 2015-05-10 MED ORDER — VANCOMYCIN HCL 10 G IV SOLR
1500.0000 mg | Freq: Two times a day (BID) | INTRAVENOUS | Status: DC
  Administered 2015-05-10 – 2015-05-14 (×7): 1500 mg via INTRAVENOUS
  Filled 2015-05-10 (×12): qty 1500

## 2015-05-10 NOTE — Progress Notes (Signed)
He is scheduled for Pleurx catheter today and Port-A-Cath tomorrow. He feels better. His breathing is doing better. The Pleurx could be removed after he starts chemotherapy if he quits reaccumulating pleural fluid. I will follow more peripherally. Thanks for allowing me to see him with you

## 2015-05-10 NOTE — Progress Notes (Signed)
For Port-A-Cath insertion tomorrow. The risks and benefits of the procedure including bleeding, infection, and pneumothorax were fully explained to the patient, who gave informed consent.

## 2015-05-10 NOTE — Progress Notes (Signed)
ANTIBIOTIC CONSULT NOTE-  Pharmacy Consult for Vancomycin and Zosyn Indication: Pneumonia  No Known Allergies  Patient Measurements: Height: '5\' 8"'$  (172.7 cm) Weight: 131 lb 14.4 oz (59.829 kg) IBW/kg (Calculated) : 68.4  Vital Signs: Temp: 98.8 F (37.1 C) (10/25 0418) Temp Source: Oral (10/25 0418) BP: 110/68 mmHg (10/25 0418) Pulse Rate: 94 (10/25 0418)  Labs:  Recent Labs  05/08/15 0104 05/09/15 0713  WBC 17.0* 13.0*  HGB 10.0* 8.8*  PLT 691* 669*  CREATININE 0.83 0.73   Estimated Creatinine Clearance: 91.4 mL/min (by C-G formula based on Cr of 0.73).   Recent Labs  05/10/15 0610  [facility] 11    Microbiology: Recent Results (from the past 720 hour(s))  Culture, body fluid-bottle     Status: None   Collection Time: 04/23/15 11:18 AM  Result Value Ref Range Status   Specimen Description FLUID PLEURAL RIGHT  Final   Special Requests NONE  Final   Culture NO GROWTH 5 DAYS  Final   Report Status 04/28/2015 FINAL  Final  Gram stain     Status: None   Collection Time: 04/23/15 11:18 AM  Result Value Ref Range Status   Specimen Description FLUID PLEURAL RIGHT  Final   Special Requests NONE  Final   Gram Stain   Final    FEW WBC PRESENT, PREDOMINANTLY MONONUCLEAR NO ORGANISMS SEEN    Report Status 04/23/2015 FINAL  Final  Culture, respiratory (NON-Expectorated)     Status: None   Collection Time: 04/27/15  9:25 AM  Result Value Ref Range Status   Specimen Description BRONCHIAL ALVEOLAR LAVAGE  Final   Special Requests Normal  Final   Gram Stain   Final    FEW WBC PRESENT,BOTH PMN AND MONONUCLEAR NO SQUAMOUS EPITHELIAL CELLS SEEN NO ORGANISMS SEEN Performed at [facility]    Culture   Final    NO GROWTH 2 DAYS Performed at [facility]    Report Status 04/29/2015 FINAL  Final  Fungus Culture with Smear     Status: None (Preliminary result)   Collection Time: 04/27/15  9:25 AM  Result Value Ref Range Status   Specimen Description  BRONCHIAL ALVEOLAR LAVAGE  Final   Special Requests Normal  Final   Fungal Smear   Final    NO YEAST OR FUNGAL ELEMENTS SEEN Performed at [facility]    Culture   Final    CULTURE IN PROGRESS FOR FOUR WEEKS Performed at [facility]    Report Status PENDING  Incomplete  AFB culture with smear     Status: None (Preliminary result)   Collection Time: 04/27/15  9:25 AM  Result Value Ref Range Status   Specimen Description BRONCHIAL ALVEOLAR LAVAGE  Final   Special Requests Normal  Final   Acid Fast Smear   Final    NO ACID FAST BACILLI SEEN Performed at [facility]    Culture   Final    CULTURE WILL BE EXAMINED FOR 6 WEEKS BEFORE ISSUING A FINAL REPORT Performed at [facility]    Report Status PENDING  Incomplete  Culture, blood (routine x 2)     Status: None (Preliminary result)   Collection Time: 05/08/15  3:45 AM  Result Value Ref Range Status   Specimen Description RIGHT ANTECUBITAL  Final   Special Requests BOTTLES DRAWN AEROBIC AND ANAEROBIC 6CC  Final   Culture NO GROWTH 1 DAY  Final   Report Status PENDING  Incomplete  Culture, blood (  routine x 2)     Status: None (Preliminary result)   Collection Time: 05/08/15  3:55 AM  Result Value Ref Range Status   Specimen Description BLOOD RIGHT ARM  Final   Special Requests BOTTLES DRAWN AEROBIC AND ANAEROBIC 6CC  Final   Culture NO GROWTH 1 DAY  Final   Report Status PENDING  Incomplete    Medical History: Past Medical History  Diagnosis Date  . Tobacco use disorder 04/23/2015    Quit Fall 2016  . ETOH abuse     Quit Fall 2016  . Illicit drug use     Quit Fall 2016   Assessment: 54 yo male recently discharged from hospital with new lung cancer diagnosis. Pt returns with complaints of increased SOB. CT showed right pleural effusion. Empiric antibiotics for pneumonia vs neoplasm. Trough level is below goal.  Pt has excellent renal fxn and vanco clearance.  Pharmacokinetic dosing  service     Vancomycin single level analysis: Current dose being given: 500 mg Current dosing interval:  8 hrs Current infusion time (hrs): 1  Single level Trough Data:  Trough level obtained: 11 mcg/ml Timing of trough - Number of hours since last dose:  7 Hrs Desired peak:  35 mcg/ml  Desired trough: 15 mcg/ml  Estimated PK Parameters: --------------------------- New rate constant (kel): 0.105 hr-1 New half-life: 6.60 Hours New Vd from levels: 41.86  Liters  (0.7 L/kg)  Recommendations: ==================== Give Vancomycin  1500 mg  q 12 hrs. Infuse over 1.5 hrs Expected Cpeak: 47.5 mcg/ml    Expected Ctrough: 15.0 mcg/ml  Recommended labs and intervals: Measure Bun and Scr 3 times/week.   Thank you for the consult, will continue to follow.  Goal of Therapy:  Vancomycin troughs 15-20 mcg/ml Eradicated infection  Plan:  Increase Vancomycin to '1500mg'$  IV q12hrs Zosyn 3.375 GM IV every 8 hours Recheck Vancomycin trough at steady state Labs per protocol, f/u micro  Hart Robinsons A, RPH 05/10/2015,8:02 AM

## 2015-05-10 NOTE — Progress Notes (Signed)
Subjective: Patient had thoracentesis yesterday and about 1500 cc of fluid was removed. He is planned for pleural VAC to be done interventional radiologist. He will have port-A- Cath placed tomorrow by Dr Arnoldo Morale. His breathing is better.  Objective: Vital signs in last 24 hours: Temp:  [98.2 F (36.8 C)-98.8 F (37.1 C)] 98.8 F (37.1 C) (10/25 0418) Pulse Rate:  [94-108] 94 (10/25 0418) Resp:  [16-20] 20 (10/25 0418) BP: (103-116)/(61-77) 110/68 mmHg (10/25 0418) SpO2:  [95 %-99 %] 96 % (10/25 0418) Weight change:  Last BM Date: 05/08/15  Intake/Output from previous day: 10/24 0701 - 10/25 0700 In: 1260 [P.O.:960; IV Piggyback:300] Out: 2225 [Urine:2225]  PHYSICAL EXAM General appearance: cachectic and no distress Resp: diminished breath sounds bilaterally and rhonchi bilaterally Cardio: S1, S2 normal GI: soft, non-tender; bowel sounds normal; no masses,  no organomegaly Extremities: extremities normal, atraumatic, no cyanosis or edema  Lab Results:  Results for orders placed or performed during the hospital encounter of 05/07/15 (from the past 48 hour(s))  Basic metabolic panel     Status: Abnormal   Collection Time: 05/09/15  7:13 AM  Result Value Ref Range   Sodium 135 135 - 145 mmol/L   Potassium 3.9 3.5 - 5.1 mmol/L   Chloride 98 (L) 101 - 111 mmol/L   CO2 30 22 - 32 mmol/L   Glucose, Bld 100 (H) 65 - 99 mg/dL   BUN 6 6 - 20 mg/dL   Creatinine, Ser 0.73 0.61 - 1.24 mg/dL   Calcium 8.8 (L) 8.9 - 10.3 mg/dL   GFR calc non Af Amer >60 >60 mL/min   GFR calc Af Amer >60 >60 mL/min    Comment: (NOTE) The eGFR has been calculated using the CKD EPI equation. This calculation has not been validated in all clinical situations. eGFR's persistently <60 mL/min signify possible Chronic Kidney Disease.    Anion gap 7 5 - 15  CBC     Status: Abnormal   Collection Time: 05/09/15  7:13 AM  Result Value Ref Range   WBC 13.0 (H) 4.0 - 10.5 K/uL   RBC 3.12 (L) 4.22 - 5.81  MIL/uL   Hemoglobin 8.8 (L) 13.0 - 17.0 g/dL   HCT 26.8 (L) 39.0 - 52.0 %   MCV 85.9 78.0 - 100.0 fL   MCH 28.2 26.0 - 34.0 pg   MCHC 32.8 30.0 - 36.0 g/dL   RDW 14.3 11.5 - 15.5 %   Platelets 669 (H) 150 - 400 K/uL  Vancomycin, trough     Status: None   Collection Time: 05/10/15  6:10 AM  Result Value Ref Range   Vancomycin Tr 11 10.0 - 20.0 ug/mL    ABGS No results for input(s): PHART, PO2ART, TCO2, HCO3 in the last 72 hours.  Invalid input(s): PCO2 CULTURES Recent Results (from the past 240 hour(s))  Culture, blood (routine x 2)     Status: None (Preliminary result)   Collection Time: 05/08/15  3:45 AM  Result Value Ref Range Status   Specimen Description RIGHT ANTECUBITAL  Final   Special Requests BOTTLES DRAWN AEROBIC AND ANAEROBIC 6CC  Final   Culture NO GROWTH 1 DAY  Final   Report Status PENDING  Incomplete  Culture, blood (routine x 2)     Status: None (Preliminary result)   Collection Time: 05/08/15  3:55 AM  Result Value Ref Range Status   Specimen Description BLOOD RIGHT ARM  Final   Special Requests BOTTLES DRAWN AEROBIC AND ANAEROBIC 6CC  Final   Culture NO GROWTH 1 DAY  Final   Report Status PENDING  Incomplete   Studies/Results: Dg Chest 1 View  05/09/2015  CLINICAL DATA:  Post RIGHT thoracentesis EXAM: CHEST 1 VIEW COMPARISON:  Expiratory chest radiograph compared to 05/04/2015 and correlated with CT chest of 05/08/2015 FINDINGS: Stable heart size. Persistent loculated RIGHT pleural effusion identified with scattered areas of atelectasis and consolidation within RIGHT lung. Underlying emphysematous changes with persistent infiltrates in the mid to lower LEFT lung. No pneumothorax identified. No acute osseous findings. IMPRESSION: No pneumothorax following RIGHT thoracentesis. Persistent loculated RIGHT pleural effusion with areas of atelectasis and consolidation of RIGHT lung. Persistent infiltrate LEFT lower lobe. Findings discussed with patient. Patient  asymptomatic following procedure. Electronically Signed   By: Lavonia Dana M.D.   On: 05/09/2015 10:41   US Thoracentesis Asp Pleural Space W/img Guide  05/09/2015  CLINICAL DATA:  Shortness of breath, recurrent RIGHT pleural effusion EXAM: ULTRASOUND GUIDED 05/04/2015 THORACENTESIS COMPARISON:  None. PROCEDURE: Procedure, benefits, and risks of procedure were discussed with patient. Written informed consent for procedure was obtained. Time out protocol followed. Pleural effusion localized by ultrasound at the posterior RIGHT hemithorax. Skin prepped and draped in usual sterile fashion. Skin and soft tissues anesthetized with 8 mL of 1% lidocaine. 8 French thoracentesis catheter placed into the RIGHT pleural space. 1500 mL of dark old appearing bloody fluid aspirated by syringe pump. Procedure tolerated well by patient without immediate complication. COMPLICATIONS: None FINDINGS: As above IMPRESSION: Successful ultrasound guided RIGHT thoracentesis yielding 1500 mL of dark old appearing bloody pleural fluid. Electronically Signed   By: Lavonia Dana M.D.   On: 05/09/2015 11:17    Medications: I have reviewed the patient's current medications.  Assesment:   Active Problems:   Leukocytosis   Recurrent pleural effusion on right   Pleural effusion R/O pneumonia  (health care associated) COPD   Plan:  Medications reviewed Will continue IV antibiotics Continue nebulizer treatment Pulmonary consult  appreciated As per pulmonary plan.    LOS: 2 days   Shauni Henner 05/10/2015, 8:10 AM

## 2015-05-11 ENCOUNTER — Encounter (HOSPITAL_COMMUNITY)
Admission: EM | Disposition: A | Discharge status: Home / Self Care | Payer: Self-pay | Source: Home / Self Care | Attending: Internal Medicine

## 2015-05-11 ENCOUNTER — Inpatient Hospital Stay (HOSPITAL_COMMUNITY): Payer: Medicaid Other | Admitting: Anesthesiology

## 2015-05-11 ENCOUNTER — Inpatient Hospital Stay (HOSPITAL_COMMUNITY): Payer: Medicaid Other

## 2015-05-11 ENCOUNTER — Encounter (HOSPITAL_COMMUNITY): Payer: Self-pay | Admitting: *Deleted

## 2015-05-11 HISTORY — PX: PORTACATH PLACEMENT: SHX2246

## 2015-05-11 LAB — BASIC METABOLIC PANEL
Anion gap: 7 (ref 5–15)
Anion gap: 7 (ref 5–15)
BUN: 9 mg/dL (ref 6–20)
BUN: 8 mg/dL (ref 6–20)
CHLORIDE: 97 mmol/L — AB (ref 101–111)
CHLORIDE: 97 mmol/L — AB (ref 101–111)
CO2: 28 mmol/L (ref 22–32)
CO2: 29 mmol/L (ref 22–32)
Calcium: 8.5 mg/dL — ABNORMAL LOW (ref 8.9–10.3)
Calcium: 8.7 mg/dL — ABNORMAL LOW (ref 8.9–10.3)
Creatinine, Ser: 0.84 mg/dL (ref 0.61–1.24)
Creatinine, Ser: 0.81 mg/dL (ref 0.61–1.24)
GFR calc Af Amer: >60 mL/min (ref >60)
GFR calc Af Amer: >60 mL/min (ref >60)
GFR calc non Af Amer: >60 mL/min (ref >60)
GFR calc non Af Amer: >60 mL/min (ref >60)
GLUCOSE: 160 mg/dL — AB (ref 65–99)
Glucose, Bld: 99 mg/dL (ref 65–99)
POTASSIUM: 3.7 mmol/L (ref 3.5–5.1)
Potassium: 4 mmol/L (ref 3.5–5.1)
SODIUM: 132 mmol/L — AB (ref 135–145)
SODIUM: 133 mmol/L — AB (ref 135–145)

## 2015-05-11 LAB — CBC
HCT: 24.3 % — ABNORMAL LOW (ref 39.0–52.0)
HEMOGLOBIN: 8.2 g/dL — AB (ref 13.0–17.0)
MCH: 29 pg (ref 26.0–34.0)
MCHC: 33.7 g/dL (ref 30.0–36.0)
MCV: 85.9 fL (ref 78.0–100.0)
Platelets: 538 10*3/uL — ABNORMAL HIGH (ref 150–400)
RBC: 2.83 MIL/uL — AB (ref 4.22–5.81)
RDW: 14.2 % (ref 11.5–15.5)
WBC: 11.2 10*3/uL — ABNORMAL HIGH (ref 4.0–10.5)

## 2015-05-11 LAB — SURGICAL PCR SCREEN
MRSA, PCR: NEGATIVE
STAPHYLOCOCCUS AUREUS: NEGATIVE

## 2015-05-11 SURGERY — INSERTION, TUNNELED CENTRAL VENOUS DEVICE, WITH PORT
Anesthesia: Monitor Anesthesia Care | Site: Chest | Laterality: Right

## 2015-05-11 MED ORDER — LIDOCAINE HCL (CARDIAC) 10 MG/ML IV SOLN
INTRAVENOUS | Status: DC | PRN
  Administered 2015-05-11: 50 mg via INTRAVENOUS

## 2015-05-11 MED ORDER — KETOROLAC TROMETHAMINE 30 MG/ML IJ SOLN: 30.0000 mg | Freq: Once | INTRAMUSCULAR | Status: DC

## 2015-05-11 MED ORDER — ATROPINE SULFATE 0.4 MG/ML IJ SOLN
INTRAMUSCULAR | Status: AC
  Filled 2015-05-11: qty 1

## 2015-05-11 MED ORDER — PROPOFOL 10 MG/ML IV BOLUS
INTRAVENOUS | Status: AC
  Filled 2015-05-11: qty 20

## 2015-05-11 MED ORDER — LIDOCAINE HCL (PF) 1 % IJ SOLN
INTRAMUSCULAR | Status: AC
Start: 2015-05-11 — End: 2015-05-11
  Filled 2015-05-11: qty 30

## 2015-05-11 MED ORDER — MIDAZOLAM HCL 2 MG/2ML IJ SOLN
INTRAMUSCULAR | Status: AC
  Filled 2015-05-11: qty 2

## 2015-05-11 MED ORDER — MIDAZOLAM HCL 2 MG/2ML IJ SOLN
1.0000 mg | INTRAMUSCULAR | Status: DC | PRN
  Administered 2015-05-11: 2 mg via INTRAVENOUS

## 2015-05-11 MED ORDER — FENTANYL CITRATE (PF) 100 MCG/2ML IJ SOLN
INTRAMUSCULAR | Status: DC | PRN
  Administered 2015-05-11 (×4): 25 ug via INTRAVENOUS

## 2015-05-11 MED ORDER — HEPARIN SOD (PORK) LOCK FLUSH 100 UNIT/ML IV SOLN
INTRAVENOUS | Status: DC | PRN
  Administered 2015-05-11: 500 [IU] via INTRAVENOUS

## 2015-05-11 MED ORDER — HEPARIN SOD (PORK) LOCK FLUSH 100 UNIT/ML IV SOLN
INTRAVENOUS | Status: AC
  Filled 2015-05-11: qty 5

## 2015-05-11 MED ORDER — LACTATED RINGERS IV SOLN
INTRAVENOUS | Status: DC
  Administered 2015-05-11: 11:00:00 via INTRAVENOUS

## 2015-05-11 MED ORDER — FENTANYL CITRATE (PF) 100 MCG/2ML IJ SOLN: 25.0000 ug | INTRAMUSCULAR | Status: DC | PRN

## 2015-05-11 MED ORDER — LIDOCAINE HCL (PF) 1 % IJ SOLN
INTRAMUSCULAR | Status: AC
  Filled 2015-05-11: qty 5

## 2015-05-11 MED ORDER — SODIUM CHLORIDE 0.9 % IR SOLN
Status: DC | PRN
  Administered 2015-05-11: 500 mL

## 2015-05-11 MED ORDER — PROPOFOL 500 MG/50ML IV EMUL
INTRAVENOUS | Status: DC | PRN
  Administered 2015-05-11: 75 ug/kg/min via INTRAVENOUS

## 2015-05-11 MED ORDER — LIDOCAINE HCL (PF) 1 % IJ SOLN
INTRAMUSCULAR | Status: DC | PRN
  Administered 2015-05-11: 10.5 mL

## 2015-05-11 MED ORDER — ARTIFICIAL TEARS OP OINT
TOPICAL_OINTMENT | OPHTHALMIC | Status: AC
Start: 2015-05-11 — End: 2015-05-11
  Filled 2015-05-11: qty 3.5

## 2015-05-11 MED ORDER — ONDANSETRON HCL 4 MG/2ML IJ SOLN: 4.0000 mg | Freq: Once | INTRAMUSCULAR | Status: DC | PRN

## 2015-05-11 MED ORDER — FENTANYL CITRATE (PF) 100 MCG/2ML IJ SOLN
INTRAMUSCULAR | Status: AC
  Filled 2015-05-11: qty 4

## 2015-05-11 SURGICAL SUPPLY — 42 items
ADH SKN CLS APL DERMABOND .7 (GAUZE/BANDAGES/DRESSINGS) ×1
APPLIER CLIP 9.375 SM OPEN (CLIP)
APR CLP SM 9.3 20 MLT OPN (CLIP)
BAG DECANTER FOR FLEXI CONT (MISCELLANEOUS) ×3 IMPLANT
BAG HAMPER (MISCELLANEOUS) ×3 IMPLANT
CATH HICKMAN DUAL 12.0 (CATHETERS) IMPLANT
CHLORAPREP W/TINT 10.5 ML (MISCELLANEOUS) ×3 IMPLANT
CLIP APPLIE 9.375 SM OPEN (CLIP) IMPLANT
CLOTH BEACON ORANGE TIMEOUT ST (SAFETY) ×3 IMPLANT
COVER LIGHT HANDLE STERIS (MISCELLANEOUS) ×6 IMPLANT
DECANTER SPIKE VIAL GLASS SM (MISCELLANEOUS) ×3 IMPLANT
DERMABOND ADVANCED (GAUZE/BANDAGES/DRESSINGS) ×2
DERMABOND ADVANCED .7 DNX12 (GAUZE/BANDAGES/DRESSINGS) ×1 IMPLANT
DRAPE C-ARM FOLDED MOBILE STRL (DRAPES) ×3 IMPLANT
ELECT REM PT RETURN 9FT ADLT (ELECTROSURGICAL) ×3
ELECTRODE REM PT RTRN 9FT ADLT (ELECTROSURGICAL) ×1 IMPLANT
GLOVE BIO SURGEON STRL SZ 6.5 (GLOVE) ×1 IMPLANT
GLOVE BIO SURGEONS STRL SZ 6.5 (GLOVE) ×1
GLOVE BIOGEL PI IND STRL 7.0 (GLOVE) IMPLANT
GLOVE BIOGEL PI IND STRL 7.5 (GLOVE) IMPLANT
GLOVE BIOGEL PI INDICATOR 7.0 (GLOVE) ×2
GLOVE BIOGEL PI INDICATOR 7.5 (GLOVE) ×2
GLOVE SURG SS PI 7.5 STRL IVOR (GLOVE) ×6 IMPLANT
GOWN STRL REUS W/TWL LRG LVL3 (GOWN DISPOSABLE) ×6 IMPLANT
IV NS 500ML (IV SOLUTION) ×3
IV NS 500ML BAXH (IV SOLUTION) ×1 IMPLANT
KIT PORT POWER ISP 8FR (Catheter) ×2 IMPLANT
KIT ROOM TURNOVER APOR (KITS) ×3 IMPLANT
MANIFOLD NEPTUNE II (INSTRUMENTS) ×3 IMPLANT
NDL HYPO 25X1 1.5 SAFETY (NEEDLE) ×1 IMPLANT
NEEDLE HYPO 25X1 1.5 SAFETY (NEEDLE) ×3 IMPLANT
PACK MINOR (CUSTOM PROCEDURE TRAY) ×3 IMPLANT
PAD ARMBOARD 7.5X6 YLW CONV (MISCELLANEOUS) ×3 IMPLANT
SET BASIN LINEN APH (SET/KITS/TRAYS/PACK) ×3 IMPLANT
SET INTRODUCER 12FR PACEMAKER (SHEATH) IMPLANT
SHEATH COOK PEEL AWAY SET 8F (SHEATH) ×2 IMPLANT
SUT PROLENE 3 0 PS 2 (SUTURE) IMPLANT
SUT VIC AB 3-0 SH 27 (SUTURE) ×3
SUT VIC AB 3-0 SH 27X BRD (SUTURE) ×1 IMPLANT
SUT VIC AB 4-0 PS2 27 (SUTURE) ×3 IMPLANT
SYR 20CC LL (SYRINGE) ×3 IMPLANT
SYR CONTROL 10ML LL (SYRINGE) ×3 IMPLANT

## 2015-05-11 NOTE — Anesthesia Postprocedure Evaluation (Signed)
  Anesthesia Post-op Note  Patient: Brian Nelson  Procedure(s) Performed: Procedure(s): INSERTION PORT-A-CATH (N/A)  Patient Location: PACU  Anesthesia Type:MAC  Level of Consciousness: awake, alert  and patient cooperative  Airway and Oxygen Therapy: Patient Spontanous Breathing and Patient connected to nasal cannula oxygen  Post-op Pain: none  Post-op Assessment: Post-op Vital signs reviewed, Patient's Cardiovascular Status Stable, Respiratory Function Stable and Patent Airway              Post-op Vital Signs: Reviewed and stable    Complications: No apparent anesthesia complications

## 2015-05-11 NOTE — Transfer of Care (Signed)
Immediate Anesthesia Transfer of Care Note  Patient: Brian Nelson  Procedure(s) Performed: Procedure(s): INSERTION PORT-A-CATH (N/A)  Patient Location: PACU  Anesthesia Type:MAC  Level of Consciousness: awake and patient cooperative  Airway & Oxygen Therapy: Patient Spontanous Breathing and Patient connected to nasal cannula oxygen  Post-op Assessment: Report given to RN, Post -op Vital signs reviewed and stable and Patient moving all extremities  Post vital signs: Reviewed and stable  Complications: No apparent anesthesia complications

## 2015-05-11 NOTE — OR Nursing (Signed)
Patient stated that  He had 2 blisters on his lips after the last procedure he did. One left upper lip  Has heeled , still has blister to right inner upper lip.

## 2015-05-11 NOTE — Progress Notes (Signed)
Patient ID: Brian Nelson, male   DOB: 10-20-1960, 55 y.o.   MRN: [id number]   Pt scheduled for R PleurX catheter placement in IR at [facility] 10/27 To arrive [facility] 1100 am 10/27 via ambulance Will return to [facility] after placement See orders in chart  HOLD Lovenox inj 10/27

## 2015-05-11 NOTE — Op Note (Signed)
Patient:  Brian Nelson  DOB:  1960-12-29  MRN:  [id number]   Preop Diagnosis:  Metastatic carcinoma  Postop Diagnosis:  Same  Procedure:  Port-A-Cath insertion  Surgeon:  Aviva Signs, M.D.  Anes:  Mac  Indications:  Patient is a 54 year old black male who has a colon mass as well as multiple lung masses, consistent with metastatic adenocarcinoma. The patient is about to start chemotherapy and needs central venous access. The risks and benefits of the procedure including bleeding, infection, and pneumothorax were fully explained to the patient, who gave informed consent.  Procedure note:  The patient was placed in Trendelenburg position after the right upper chest was prepped and draped using usual sterile technique with DuraPrep. Surgical site confirmation was performed. 1% Xylocaine was used for local anesthesia.  An incision was made below the right clavicle. Subcutaneous pocket was then formed. A needle is advanced into the right subclavian vein using the Seldinger technique without difficulty. A guidewire was advanced under fluoroscopic guidance into the right atrium. An introducer and peel-away sheath were placed over the guidewire. The catheter was then inserted through the peel-away sheath and the peel-away sheath was removed. The catheter was then attached to the port and the port placed in subcutaneous pocket. Adequate positioning was confirmed by fluoroscopy. A power port was inserted. Good backflow blood was noted in the port. The port was flushed with heparin flush. The subcutaneous layer was reapproximated using 3-0 Vicryl interrupted suture. The skin was closed using a 4 Vicryl subcuticular suture. Dermabond was then applied.  All tape and needle counts were correct at the end of the procedure. Patient was transferred to PACU in stable condition. A chest x-ray will be performed at that time.   Complications:  None  EBL:  Minimal  Specimen:  None

## 2015-05-11 NOTE — Progress Notes (Signed)
Subjective: Patient is resting. He feels better, however, he recurrent cough and congestion. Patient is planned for Port-A- Cath placement today..  Objective: Vital signs in last 24 hours: Temp:  [97.6 F (36.4 C)-98.8 F (37.1 C)] 97.6 F (36.4 C) (10/26 0502) Pulse Rate:  [86-99] 86 (10/26 0502) Resp:  [18-20] 18 (10/26 0502) BP: (112-116)/(64-71) 116/71 mmHg (10/26 0502) SpO2:  [87 %-100 %] 97 % (10/26 0708) Weight change:  Last BM Date: 05/08/15  Intake/Output from previous day: 10/25 0701 - 10/26 0700 In: 1810 [P.O.:960; IV Piggyback:850] Out: 1175 [Urine:1175]  PHYSICAL EXAM General appearance: cachectic and no distress Resp: diminished breath sounds bilaterally and rhonchi bilaterally Cardio: S1, S2 normal GI: soft, non-tender; bowel sounds normal; no masses,  no organomegaly Extremities: extremities normal, atraumatic, no cyanosis or edema  Lab Results:  Results for orders placed or performed during the hospital encounter of 05/07/15 (from the past 48 hour(s))  Vancomycin, trough     Status: None   Collection Time: 05/10/15  6:10 AM  Result Value Ref Range   Vancomycin Tr 11 10.0 - 20.0 ug/mL  Type and screen [facility]     Status: None   Collection Time: 05/10/15  8:03 AM  Result Value Ref Range   ABO/RH(D) A POS    Antibody Screen NEG    Sample Expiration 05/13/2015   Vitamin B12     Status: None   Collection Time: 05/10/15 11:19 AM  Result Value Ref Range   Vitamin B-12 763 180 - 914 pg/mL    Comment: (NOTE) This assay is not validated for testing neonatal or myeloproliferative syndrome specimens for Vitamin B12 levels. Performed at [facility]   Iron and TIBC     Status: Abnormal   Collection Time: 05/10/15 11:19 AM  Result Value Ref Range   Iron 11 (L) 45 - 182 ug/dL   TIBC 197 (L) 250 - 450 ug/dL   Saturation Ratios 6 (L) 17.9 - 39.5 %   UIBC 186 ug/dL    Comment: Performed at [facility]  Ferritin     Status: None    Collection Time: 05/10/15 11:19 AM  Result Value Ref Range   Ferritin 104 24 - 336 ng/mL    Comment: Performed at [facility]  Reticulocytes     Status: Abnormal   Collection Time: 05/10/15 11:19 AM  Result Value Ref Range   Retic Ct Pct 1.4 0.4 - 3.1 %   RBC. 3.07 (L) 4.22 - 5.81 MIL/uL   Retic Count, Manual 43.0 19.0 - 186.0 K/uL  Folate     Status: None   Collection Time: 05/10/15 11:21 AM  Result Value Ref Range   Folate 16.8 >5.9 ng/mL    Comment: Performed at [facility]  CBC     Status: Abnormal   Collection Time: 05/11/15 12:27 AM  Result Value Ref Range   WBC 11.2 (H) 4.0 - 10.5 K/uL   RBC 2.83 (L) 4.22 - 5.81 MIL/uL   Hemoglobin 8.2 (L) 13.0 - 17.0 g/dL   HCT 24.3 (L) 39.0 - 52.0 %   MCV 85.9 78.0 - 100.0 fL   MCH 29.0 26.0 - 34.0 pg   MCHC 33.7 30.0 - 36.0 g/dL   RDW 14.2 11.5 - 15.5 %   Platelets 538 (H) 150 - 400 K/uL  Basic metabolic panel     Status: Abnormal   Collection Time: 05/11/15 12:27 AM  Result Value Ref Range   Sodium 132 (L)  135 - 145 mmol/L   Potassium 3.7 3.5 - 5.1 mmol/L   Chloride 97 (L) 101 - 111 mmol/L   CO2 28 22 - 32 mmol/L   Glucose, Bld 160 (H) 65 - 99 mg/dL   BUN 9 6 - 20 mg/dL   Creatinine, Ser 0.84 0.61 - 1.24 mg/dL   Calcium 8.5 (L) 8.9 - 10.3 mg/dL   GFR calc non Af Amer >60 >60 mL/min   GFR calc Af Amer >60 >60 mL/min    Comment: (NOTE) The eGFR has been calculated using the CKD EPI equation. This calculation has not been validated in all clinical situations. eGFR's persistently <60 mL/min signify possible Chronic Kidney Disease.    Anion gap 7 5 - 15  Surgical pcr screen     Status: None   Collection Time: 05/11/15  1:05 AM  Result Value Ref Range   MRSA, PCR NEGATIVE NEGATIVE   Staphylococcus aureus NEGATIVE NEGATIVE    Comment:        The Xpert SA Assay (FDA approved for NASAL specimens in patients over 81 years of age), is one component of a comprehensive surveillance program.  Test performance  has been validated by [facility] for patients greater than or equal to 20 year old. It is not intended to diagnose infection nor to guide or monitor treatment.   Basic metabolic panel     Status: Abnormal   Collection Time: 05/11/15  5:30 AM  Result Value Ref Range   Sodium 133 (L) 135 - 145 mmol/L   Potassium 4.0 3.5 - 5.1 mmol/L   Chloride 97 (L) 101 - 111 mmol/L   CO2 29 22 - 32 mmol/L   Glucose, Bld 99 65 - 99 mg/dL   BUN 8 6 - 20 mg/dL   Creatinine, Ser 0.81 0.61 - 1.24 mg/dL   Calcium 8.7 (L) 8.9 - 10.3 mg/dL   GFR calc non Af Amer >60 >60 mL/min   GFR calc Af Amer >60 >60 mL/min    Comment: (NOTE) The eGFR has been calculated using the CKD EPI equation. This calculation has not been validated in all clinical situations. eGFR's persistently <60 mL/min signify possible Chronic Kidney Disease.    Anion gap 7 5 - 15    ABGS No results for input(s): PHART, PO2ART, TCO2, HCO3 in the last 72 hours.  Invalid input(s): PCO2 CULTURES Recent Results (from the past 240 hour(s))  Culture, blood (routine x 2)     Status: None (Preliminary result)   Collection Time: 05/08/15  3:45 AM  Result Value Ref Range Status   Specimen Description RIGHT ANTECUBITAL  Final   Special Requests BOTTLES DRAWN AEROBIC AND ANAEROBIC 6CC  Final   Culture NO GROWTH 1 DAY  Final   Report Status PENDING  Incomplete  Culture, blood (routine x 2)     Status: None (Preliminary result)   Collection Time: 05/08/15  3:55 AM  Result Value Ref Range Status   Specimen Description BLOOD RIGHT ARM  Final   Special Requests BOTTLES DRAWN AEROBIC AND ANAEROBIC 6CC  Final   Culture NO GROWTH 1 DAY  Final   Report Status PENDING  Incomplete  Surgical pcr screen     Status: None   Collection Time: 05/11/15  1:05 AM  Result Value Ref Range Status   MRSA, PCR NEGATIVE NEGATIVE Final   Staphylococcus aureus NEGATIVE NEGATIVE Final    Comment:        The Xpert SA Assay (FDA approved for NASAL  specimens in  patients over 42 years of age), is one component of a comprehensive surveillance program.  Test performance has been validated by [facility] for patients greater than or equal to 34 year old. It is not intended to diagnose infection nor to guide or monitor treatment.    Studies/Results: Dg Chest 1 View  05/09/2015  CLINICAL DATA:  Post RIGHT thoracentesis EXAM: CHEST 1 VIEW COMPARISON:  Expiratory chest radiograph compared to 05/04/2015 and correlated with CT chest of 05/08/2015 FINDINGS: Stable heart size. Persistent loculated RIGHT pleural effusion identified with scattered areas of atelectasis and consolidation within RIGHT lung. Underlying emphysematous changes with persistent infiltrates in the mid to lower LEFT lung. No pneumothorax identified. No acute osseous findings. IMPRESSION: No pneumothorax following RIGHT thoracentesis. Persistent loculated RIGHT pleural effusion with areas of atelectasis and consolidation of RIGHT lung. Persistent infiltrate LEFT lower lobe. Findings discussed with patient. Patient asymptomatic following procedure. Electronically Signed   By: Lavonia Dana M.D.   On: 05/09/2015 10:41   US Thoracentesis Asp Pleural Space W/img Guide  05/09/2015  CLINICAL DATA:  Shortness of breath, recurrent RIGHT pleural effusion EXAM: ULTRASOUND GUIDED 05/04/2015 THORACENTESIS COMPARISON:  None. PROCEDURE: Procedure, benefits, and risks of procedure were discussed with patient. Written informed consent for procedure was obtained. Time out protocol followed. Pleural effusion localized by ultrasound at the posterior RIGHT hemithorax. Skin prepped and draped in usual sterile fashion. Skin and soft tissues anesthetized with 8 mL of 1% lidocaine. 8 French thoracentesis catheter placed into the RIGHT pleural space. 1500 mL of dark old appearing bloody fluid aspirated by syringe pump. Procedure tolerated well by patient without immediate complication. COMPLICATIONS: None FINDINGS: As above  IMPRESSION: Successful ultrasound guided RIGHT thoracentesis yielding 1500 mL of dark old appearing bloody pleural fluid. Electronically Signed   By: Lavonia Dana M.D.   On: 05/09/2015 11:17    Medications: I have reviewed the patient's current medications.  Assesment:   Active Problems:   Leukocytosis   Recurrent pleural effusion on right   Pleural effusion R/O pneumonia  (health care associated) COPD Anemia  Plan:  Medications reviewed Will continue IV antibiotics Continue nebulizer treatment Will monitor CBC /BMP Port-A- Cath placement today as planned by Dr. Arnoldo Morale.    LOS: 3 days   Arshiya Jakes 05/11/2015, 7:54 AM

## 2015-05-11 NOTE — Anesthesia Preprocedure Evaluation (Signed)
Anesthesia Evaluation  Patient identified by MRN, date of birth, ID band Patient awake    Reviewed: Allergy & Precautions, NPO status , Patient's Chart, lab work & pertinent test results, Unable to perform ROS - Chart review only  History of Anesthesia Complications Negative for: history of anesthetic complications  Airway Mallampati: II  TM Distance: >3 FB Neck ROM: Full    Dental  (+) Chipped, Missing,    Pulmonary shortness of breath, at rest and Long-Term Oxygen Therapy, pneumonia, COPD,  oxygen dependent, former smoker,     + decreased breath sounds      Cardiovascular Exercise Tolerance: Poor Normal cardiovascular exam     Neuro/Psych Anxiety    GI/Hepatic (+)     substance abuse  alcohol use and cocaine use,   Endo/Other    Renal/GU      Musculoskeletal   Abdominal Normal abdominal exam  (+)   Peds  Hematology  (+) anemia ,   Anesthesia Other Findings   Reproductive/Obstetrics                             Anesthesia Physical Anesthesia Plan  ASA: IV  Anesthesia Plan: MAC   Post-op Pain Management:    Induction:   Airway Management Planned: Nasal Cannula  Additional Equipment:   Intra-op Plan:   Post-operative Plan:   Informed Consent: I have reviewed the patients History and Physical, chart, labs and discussed the procedure including the risks, benefits and alternatives for the proposed anesthesia with the patient or authorized representative who has indicated his/her understanding and acceptance.     Plan Discussed with: CRNA  Anesthesia Plan Comments:         Anesthesia Quick Evaluation

## 2015-05-11 NOTE — Anesthesia Procedure Notes (Addendum)
Procedure Name: MAC Date/Time: 05/11/2015 11:40 AM Performed by: Vista Deck Pre-anesthesia Checklist: Patient identified, Emergency Drugs available, Suction available, Timeout performed and Patient being monitored Patient Re-evaluated:Patient Re-evaluated prior to inductionOxygen Delivery Method: Non-rebreather mask

## 2015-05-11 NOTE — Progress Notes (Signed)
RN received report from Richarda Osmond about patient coming back from surgery. Pt VSS and is in no distress.  RN will continue to monitor. Oswald Hillock, RN

## 2015-05-12 ENCOUNTER — Encounter (HOSPITAL_COMMUNITY): Payer: Self-pay | Admitting: General Surgery

## 2015-05-12 ENCOUNTER — Ambulatory Visit (HOSPITAL_COMMUNITY)
Admit: 2015-05-12 | Discharge: 2015-05-12 | Disposition: A | Discharge status: Home / Self Care | Payer: Medicaid Other | Attending: General Surgery | Admitting: General Surgery

## 2015-05-12 VITALS — BP 122/71 | HR 104 | Resp 16

## 2015-05-12 DIAGNOSIS — J90 Pleural effusion, not elsewhere classified: Secondary | ICD-10-CM

## 2015-05-12 DIAGNOSIS — C801 Malignant (primary) neoplasm, unspecified: Secondary | ICD-10-CM | POA: Insufficient documentation

## 2015-05-12 LAB — CBC
HEMATOCRIT: 25.2 % — AB (ref 39.0–52.0)
HEMOGLOBIN: 8.4 g/dL — AB (ref 13.0–17.0)
MCH: 28.5 pg (ref 26.0–34.0)
MCHC: 33.3 g/dL (ref 30.0–36.0)
MCV: 85.4 fL (ref 78.0–100.0)
PLATELETS: 652 10*3/uL — AB (ref 150–400)
RBC: 2.95 MIL/uL — AB (ref 4.22–5.81)
RDW: 14.2 % (ref 11.5–15.5)
WBC: 11 10*3/uL — AB (ref 4.0–10.5)

## 2015-05-12 LAB — PROTIME-INR
INR: 1.23 (ref 0.00–1.49)
PROTHROMBIN TIME: 15.6 s — AB (ref 11.6–15.2)

## 2015-05-12 LAB — APTT: aPTT: 44 seconds — ABNORMAL HIGH (ref 24–37)

## 2015-05-12 MED ORDER — HYDROCODONE-ACETAMINOPHEN 5-325 MG PO TABS
1.0000 | ORAL_TABLET | ORAL | Status: DC | PRN
  Administered 2015-05-12: 1 via ORAL
  Administered 2015-05-12: 2 via ORAL
  Administered 2015-05-13 (×2): 1 via ORAL
  Administered 2015-05-14: 2 via ORAL
  Filled 2015-05-12 (×2): qty 1
  Filled 2015-05-12 (×2): qty 2
  Filled 2015-05-12: qty 1

## 2015-05-12 MED ORDER — CEFAZOLIN (ANCEF) 1 G IV SOLR
2.0000 g | Freq: Once | INTRAVENOUS | Status: AC
  Administered 2015-05-12: 2 g

## 2015-05-12 MED ORDER — CEFAZOLIN SODIUM-DEXTROSE 2-3 GM-% IV SOLR
INTRAVENOUS | Status: AC
  Filled 2015-05-12: qty 50

## 2015-05-12 MED ORDER — IPRATROPIUM-ALBUTEROL 0.5-2.5 (3) MG/3ML IN SOLN
3.0000 mL | Freq: Three times a day (TID) | RESPIRATORY_TRACT | Status: DC
  Administered 2015-05-12 – 2015-05-14 (×5): 3 mL via RESPIRATORY_TRACT
  Filled 2015-05-12 (×5): qty 3

## 2015-05-12 MED ORDER — MIDAZOLAM HCL 2 MG/2ML IJ SOLN
INTRAMUSCULAR | Status: AC
  Filled 2015-05-12: qty 2

## 2015-05-12 MED ORDER — ENOXAPARIN SODIUM 40 MG/0.4ML ~~LOC~~ SOLN
40.0000 mg | SUBCUTANEOUS | Status: DC
  Administered 2015-05-13 – 2015-05-14 (×2): 40 mg via SUBCUTANEOUS
  Filled 2015-05-12: qty 0.4

## 2015-05-12 MED ORDER — FENTANYL CITRATE (PF) 100 MCG/2ML IJ SOLN
INTRAMUSCULAR | Status: AC
  Filled 2015-05-12: qty 2

## 2015-05-12 MED ORDER — MIDAZOLAM HCL 2 MG/2ML IJ SOLN
INTRAMUSCULAR | Status: AC | PRN
  Administered 2015-05-12 (×2): 1 mg via INTRAVENOUS

## 2015-05-12 MED ORDER — LIDOCAINE HCL 1 % IJ SOLN
INTRAMUSCULAR | Status: AC
  Filled 2015-05-12: qty 20

## 2015-05-12 MED ORDER — FENTANYL CITRATE (PF) 100 MCG/2ML IJ SOLN
INTRAMUSCULAR | Status: AC | PRN
  Administered 2015-05-12 (×2): 50 ug via INTRAVENOUS

## 2015-05-12 NOTE — Procedures (Signed)
R chest tunneled PleurEx catheter placed 1L blood fluid removed. No complication No blood loss. See complete dictation in [facility].

## 2015-05-12 NOTE — Sedation Documentation (Signed)
Report to [facility] pt transferred back to [facility]

## 2015-05-12 NOTE — Progress Notes (Signed)
Pt arrived back to unit via [facility]. Report received from [facility].  Pt is stable and in no distress.  RN will continue to monitor. Oswald Hillock, RN

## 2015-05-12 NOTE — Progress Notes (Signed)
He is planned for Pleurx catheter placement today in [place]. He had Port-A-Cath placed yesterday. Dr. Legrand Rams will be out of town the rest of the week and I will assume his care in the morning

## 2015-05-12 NOTE — Consult Note (Signed)
Chief Complaint: Patient was seen in consultation today for right  Pleurx catheter placement  Referring Physician(s): Jenkins,Mark  History of Present Illness: Brian Nelson is a 54 y.o. male , former smoker, with history of alcohol and illicit drug abuse as well as non-small cell lung cancer and recurrent symptomatic malignant right pleural effusion. Request now received for right Pleurx catheter placement.  Past Medical History  Diagnosis Date  . Tobacco use disorder 04/23/2015    Quit Fall 2016  . ETOH abuse     Quit Fall 2016  . Illicit drug use     Quit Fall 2016    Past Surgical History  Procedure Laterality Date  . Video bronchoscopy Bilateral 04/27/2015    Procedure: VIDEO BRONCHOSCOPY WITH FLUORO;  Surgeon: Juanito Doom, MD;  Location: [facility];  Service: Cardiopulmonary;  Laterality: Bilateral;  . Hernia repair Right 2009    [facility] [facility] [place]  [facility]  . Colonoscopy with propofol N/A 05/06/2015    Procedure: COLONOSCOPY WITH PROPOFOL; IN CECUM AT 1159; WITHDRAWAL TIME 23 MINUTES;  Surgeon: Danie Binder, MD;  Location: [facility];  Service: Endoscopy;  Laterality: N/A;  . Esophagogastroduodenoscopy (egd) with propofol N/A 05/06/2015    Procedure: ESOPHAGOGASTRODUODENOSCOPY (EGD) WITH PROPOFOL;  Surgeon: Danie Binder, MD;  Location: [facility];  Service: Endoscopy;  Laterality: N/A;    Allergies: Review of patient's allergies indicates no known allergies.  Medications: Prior to Admission medications   Medication Sig Start Date End Date Taking? Authorizing Provider  oxyCODONE-acetaminophen (PERCOCET/ROXICET) 5-325 MG tablet Take 1-2 tablets by mouth every 4 (four) hours as needed for moderate pain or severe pain. 05/07/15   Rosita Fire, MD     Family History  Problem Relation Age of Onset  . Diabetes Mother     Social History   Social History  . Marital Status: Married    Spouse Name: N/A  . Number of Children: N/A  . Years of Education:  N/A   Occupational History  . Concrete work    Social History Main Topics  . Smoking status: Former Smoker -- 2.00 packs/day for 40 years    Types: Cigarettes    Quit date: 04/22/2015  . Smokeless tobacco: Never Used  . Alcohol Use: 3.6 oz/week    6 Cans of beer per week     Comment: Quit in fall 2016  . Drug Use: Yes    Special: Cocaine, Marijuana     Comment: Quit in Fall 2016  . Sexual Activity: Not on file   Other Topics Concern  . Not on file   Social History Narrative      Review of Systems  Constitutional: Negative for fever and chills.  Respiratory: Positive for shortness of breath.        Occasional cough  Cardiovascular: Negative for chest pain.  Gastrointestinal: Negative for nausea, vomiting and abdominal pain.  Genitourinary: Negative for dysuria and hematuria.  Musculoskeletal: Positive for back pain.  Neurological: Negative for headaches.    Vital Signs: Blood pressure 116/73, temperature 98.2, heart rate 88, respirations 18, oxygen saturation is 100% on 3 L nasal cannula  Physical Exam  Constitutional: He is oriented to person, place, and time.  Thin black male in no acute distress.  Cardiovascular: Normal rate and regular rhythm.   Clean intact right chest wall Port-A-Cath  Pulmonary/Chest: Effort normal.  Diminished breath sounds on right, left clear  Abdominal: Soft. Bowel sounds are normal. There is no tenderness.  Musculoskeletal:  Normal range of motion. He exhibits no edema.  Neurological: He is alert and oriented to person, place, and time.    Mallampati Score:     Imaging: Dg Chest 1 View  05/09/2015  CLINICAL DATA:  Post RIGHT thoracentesis EXAM: CHEST 1 VIEW COMPARISON:  Expiratory chest radiograph compared to 05/04/2015 and correlated with CT chest of 05/08/2015 FINDINGS: Stable heart size. Persistent loculated RIGHT pleural effusion identified with scattered areas of atelectasis and consolidation within RIGHT lung. Underlying  emphysematous changes with persistent infiltrates in the mid to lower LEFT lung. No pneumothorax identified. No acute osseous findings. IMPRESSION: No pneumothorax following RIGHT thoracentesis. Persistent loculated RIGHT pleural effusion with areas of atelectasis and consolidation of RIGHT lung. Persistent infiltrate LEFT lower lobe. Findings discussed with patient. Patient asymptomatic following procedure. Electronically Signed   By: Lavonia Dana M.D.   On: 05/09/2015 10:41   Dg Chest 1 View  05/04/2015  CLINICAL DATA:  Post right thoracentesis EXAM: CHEST 1 VIEW COMPARISON:  05/03/2015 FINDINGS: Decreasing right effusion. Moderate partially loculated right effusion persists with diffuse right lung airspace disease. No pneumothorax. Underlying COPD. Linear scarring or atelectasis in the left upper lobe. Heart is normal size. IMPRESSION: Decreasing right effusion with moderate partially loculated right effusion persisting. No pneumothorax. Diffuse right lung airspace disease. COPD. Electronically Signed   By: Rolm Baptise M.D.   On: 05/04/2015 12:30   Dg Chest 1 View  04/23/2015  CLINICAL DATA:  Right thoracentesis. EXAM: CHEST 1 VIEW COMPARISON:  04/22/2015 FINDINGS: Much less pleural density in the right lower chest compared to the yesterday study. Right upper lobe infiltrate with opacified bullous disease at the right apex appears unchanged. Severe emphysema again noted on the left with increased interstitial markings. No pneumothorax post procedure. IMPRESSION: No complication following right paracentesis. Much less pleural density at the right lung base. Persistent right upper lobe pneumonia with opacified bullous disease at the apex. Electronically Signed   By: Nelson Chimes M.D.   On: 04/23/2015 11:23   Dg Chest 2 View  05/03/2015  CLINICAL DATA:  Shortness of breath, mild chest pain, had fluid drawn from RIGHT hemi thorax last week EXAM: CHEST  2 VIEW COMPARISON:  04/27/2015 FINDINGS: Subtotal  opacification of the RIGHT hemi thorax by a combination of fluid and increased lung atelectasis versus consolidation. Slight mediastinal shift to the LEFT. Stable heart size and pulmonary vascularity. Underlying emphysematous changes with linear subsegmental atelectasis versus scarring in LEFT upper lobe. Questionable mild LEFT perihilar infiltrate. No LEFT pleural effusion or pneumothorax. IMPRESSION: Increased opacification of the RIGHT hemi thorax by combination of pleural effusion and increased RIGHT lung atelectasis versus consolidation. COPD changes with atelectasis versus scarring in LEFT upper lobe and question mild LEFT perihilar infiltrate. Electronically Signed   By: Lavonia Dana M.D.   On: 05/03/2015 17:56   Dg Chest 2 View  04/25/2015  CLINICAL DATA:  Pleural effusion EXAM: CHEST  2 VIEW COMPARISON:  04/23/2015 FINDINGS: Dense consolidation again noted in the right upper lobe. Small right pleural effusion. Underlying COPD with bullous changes in the apices. No confluent opacity on the left. Heart is normal size. IMPRESSION: Small right pleural effusion. Dense consolidation persists in the right upper lobe. Severe COPD. Electronically Signed   By: Rolm Baptise M.D.   On: 04/25/2015 11:26   Dg Chest 2 View  04/22/2015  CLINICAL DATA:  Right-sided chest pain and shortness of breath since yesterday. EXAM: CHEST  2 VIEW COMPARISON:  None. FINDINGS: Cardiomediastinal silhouette  is normal. Mediastinal contours appear intact. The lungs are hyperinflated, and there is evidence of emphysematous changes with upper lobe predominance. There is a lucency within the left lung apex, which may represent a pneumothorax versus bullous changes. There is a small left pleural effusion. There is a large right pleural effusion. There is near complete opacification of the right upper lobe of the lung. Osseous structures are without acute abnormality. Soft tissues are grossly normal. IMPRESSION: Lung emphysema. Left apical  pneumothorax versus bullous changes of the lung parenchyma. Small left pleural effusion. Large right pleural effusion. Near complete opacification of the right upper lobe of the lung. This may represent infectious consolidation, however endobronchial lesion causing postobstructive pneumonia cannot be excluded. CT of the chest may be considered. Electronically Signed   By: Fidela Salisbury M.D.   On: 04/22/2015 21:11   Ct Chest W Contrast  05/03/2015  CLINICAL DATA:  Chest pain and shortness of breath for couple weeks, recent CT chest with RIGHT pleural effusion, underwent thoracentesis, re-accumulation of fluid in increased infiltrate, history COPD ; cytology positive for malignant effusion though uncertain as to whether pulmonary or GI source EXAM: CT CHEST WITH CONTRAST TECHNIQUE: Multidetector CT imaging of the chest was performed during intravenous contrast administration. Sagittal and coronal MPR images reconstructed from axial data set. CONTRAST:  70m OMNIPAQUE IOHEXOL 300 MG/ML  SOLN IV COMPARISON:  04/22/2015; correlation chest radiograph 05/03/2015 FINDINGS: Mild scattered atherosclerotic calcification aorta and coronary arteries. Persistent large at least partially loculated RIGHT pleural effusion with pleural thickening and scattered enhancement consistent with malignant effusion. Multiple normal sized lymph nodes at RIGHT cardiophrenic angle. Complete atelectasis of RIGHT middle and RIGHT lower lobes. Extensive opacity within LEFT upper lobe without significant volume loss ; this may represent diffuse infiltrate though tumor is not excluded. Abnormal soft tissue at RIGHT hilum suspicious for adenopathy, node 17 mm short axis image 30. Enlarged precarinal lymph node 16 mm short axis image 29. Loculated fluid collection at RIGHT apex. Scattered upper normal sized LEFT hilar nodes. Visualized upper abdomen unremarkable. Large bulla LEFT apex 12.2 cm diameter. Again identified multiple subpleural  nodular foci in LEFT lung question tumor versus infiltrate, largest 12 mm diameter. Scattered areas of minimal infiltrate in the LEFT upper and LEFT lower lobes are slightly increased since previous exam. No LEFT pleural effusion or pneumothorax. Degenerative disc disease changes lower cervical and mid thoracic spine. No definite osseous metastatic lesions. IMPRESSION: Persistent large at least partially loculated RIGHT pleural effusion with scattered areas of pleural thickening and enhancement consistent with malignant pleural effusion as identified by prior thoracentesis and cytology. Complete atelectasis of RIGHT middle and RIGHT lower lobes. Subtotal opacification of the RIGHT upper lobe may represent diffuse consolidation or tumor. Mediastinal and suspected RIGHT hilar adenopathy with upper normal sized LEFT hilar nodes. Minimal patchy infiltrate LEFT lung increased since previous exam including several subpleural nodular foci in the LEFT upper and LEFT lower lobes, question infection versus tumor. 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 Electronically Signed   By: MLavonia DanaM.D.   On: 05/03/2015 18:59   Ct Chest W Contrast  04/22/2015  CLINICAL DATA:  Intermittent right-sided chest pain and shortness of breath beginning yesterday. History of emphysema. EXAM: CT CHEST WITH CONTRAST TECHNIQUE: Multidetector CT imaging of the chest was performed during intravenous contrast administration. CONTRAST:  1070mOMNIPAQUE IOHEXOL 300 MG/ML  SOLN COMPARISON:  Radiography same day FINDINGS: There is a background pattern of advanced emphysema I with extensive  bullous changes in the upper lobes. On the left, there is a dominant bulla measuring 12 cm in diameter. In the left lung, there are a few foci of nodular density measuring up to 1 cm in size which will require follow-up after treatment of this acute episode. Some of these could meet criteria for suspicion of malignancy. On the right, there  is a large effusion primarily loculated at the base worrisome for empyema. There is considerable volume loss in the right lower lung because of that. There is consolidation in the right upper lobe with fluid filling Bas lie at the apex consistent with advanced right upper lobe pneumonia. Certainly, a mass lesion could be hidden and this area will will need to be evaluated after treatment. There are slightly prominent lymph nodes in the mediastinum that are likely reactive to the inflammatory process. There is no pneumothorax. Scans in the upper abdomen do not show any abnormality. IMPRESSION: Advanced right upper lobe pneumonia. Extensive empyema at the right lung base. Compressive atelectasis of the right lung. Background pattern of centrilobular emphysema with a 12 cm bulla at the left lung apex. Areas of abnormal density in the left lung which will require follow-up after treatment of this acute inflammatory process. Some of these made meet criteria for suspicion for lung cancer. Electronically Signed   By: Nelson Chimes M.D.   On: 04/22/2015 22:08   Ct Angio Chest Pe W/cm &/or Wo Cm  05/08/2015  CLINICAL DATA:  Hypoxia. History of lung cancer and right pleural effusion. Progressive constant shortness of breath. EXAM: CT ANGIOGRAPHY CHEST WITH CONTRAST TECHNIQUE: Multidetector CT imaging of the chest was performed using the standard protocol during bolus administration of intravenous contrast. Multiplanar CT image reconstructions and MIPs were obtained to evaluate the vascular anatomy. CONTRAST:  16m OMNIPAQUE IOHEXOL 350 MG/ML SOLN COMPARISON:  Chest CT 5 days prior 05/03/2015 FINDINGS: There are no filling defects within the pulmonary arteries to suggest pulmonary embolus. Large partially loculated right pleural effusion has increased in size from prior. This causes near complete atelectasis of the right lower lobe. There is near complete atelectasis of the right upper lobe with small aerated portion  anteriorly. Complete atelectasis of the right middle lobe. Aerated lung demonstrates ground-glass and consolidation. There is minimal leftward shift in mediastinal structures related to large pleural effusion. Large left apical bulla measures at least 12 cm, unchanged. Advanced emphysema throughout the remainder of the left lung. Multifocal ground-glass opacities in the periphery of the left lung, some of which are nodular. Enlarged left infrahilar lymph nodes, with mild interval increase in size from prior. No definite left pleural effusion. No pericardial effusion. Unchanged enlarged paratracheal lymph nodes. These are partially obscured by adjacent pleural fluid. The heart size is normal. Thoracic aorta is normal in caliber with scattered atherosclerosis. Right epicardial/pericardial lymph nodes changed. Evaluation of the upper abdomen demonstrates no significant change from prior. Mild whole body wall and mesenteric edema. There are no acute or suspicious osseous abnormalities. 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 Review of the MIP images confirms the above findings. IMPRESSION: 1. No pulmonary embolus. 2. Large partially loculated right pleural effusion with near complete atelectasis and consolidation of the right lung. The pleural effusion has increased in size compared to exam 5 days prior. 3. Advanced emphysema with large bulla in the left upper lobe. Multifocal peripheral ground-glass opacities, some of which appear nodular are unchanged from prior exam. Infection versus neoplasm. There is left infrahilar  adenopathy. Electronically Signed   By: Jeb Levering M.D.   On: 05/08/2015 02:46   Mr Jeri Cos JJ Contrast  04/28/2015  CLINICAL DATA:  Malignant effusion. Cytology positive for non-small cell carcinoma. EXAM: MRI HEAD WITHOUT AND WITH CONTRAST TECHNIQUE: Multiplanar, multiecho pulse sequences of the brain and surrounding structures were obtained without and with  intravenous contrast. CONTRAST:  14 mL MultiHance COMPARISON:  None. FINDINGS: No acute infarct, hemorrhage, or mass lesion is present. The ventricles are of normal size. No significant extraaxial fluid collection is present. The postcontrast images demonstrate no pathologic enhancement to suggest metastatic disease the brain or meninges. No significant white matter disease is present. The internal auditory canals are within normal limits. The brainstem is within normal limits. Flow is present in the major intracranial arteries. The globes and orbits are within normal limits. The paranasal sinuses are clear. There is some fluid in the left mastoid air cells. No obstructing nasopharyngeal lesion is present. The skullbase is within normal limits. Midline structures demonstrate degenerative change in the upper cervical spine. Intracranial midline structures are within normal limits. IMPRESSION: 1. Normal MRI appearance of the brain. No evidence for metastatic disease to the brain or meninges. 2. Degenerative changes in the upper cervical spine without definite osseous metastases. Electronically Signed   By: San Morelle M.D.   On: 04/28/2015 12:10   Nm Bone Scan Whole Body  05/05/2015  CLINICAL DATA:  Malignant cells on recent right thoracentesis. Initial encounter. EXAM: NUCLEAR MEDICINE WHOLE BODY BONE SCAN TECHNIQUE: Whole body anterior and posterior images were obtained approximately 3 hours after intravenous injection of radiopharmaceutical. RADIOPHARMACEUTICALS:  27.01 mCi Technetium-13mMDP IV COMPARISON:  Chest CT 05/03/2015.  Abdominal pelvic CT 04/27/2015. FINDINGS: There is no osseous activity favoring metastatic disease. There is a mild thoracolumbar scoliosis. There is mild degenerative activity at the shoulders, knees and great toes bilaterally. The soft tissue activity appears unremarkable. IMPRESSION: No evidence of osseous metastatic disease. Electronically Signed   By: WRichardean SaleM.D.    On: 05/05/2015 14:29   Ct Abdomen Pelvis W Contrast  04/27/2015  CLINICAL DATA:  Malignant pleural effusions suggesting GI origin. No abdominal complaints. EXAM: CT ABDOMEN AND PELVIS WITH CONTRAST TECHNIQUE: Multidetector CT imaging of the abdomen and pelvis was performed using the standard protocol following bolus administration of intravenous contrast. CONTRAST:  10101mOMNIPAQUE IOHEXOL 300 MG/ML  SOLN COMPARISON:  Ultrasound abdomen 04/23/2015 FINDINGS: Moderate size right pleural effusion with probable loculation. Mild nodular pleural thickening. This may represent malignant effusion. Atelectasis or consolidation in the right lung base. Irregular nodule in the left lung base anteriorly measuring 11 mm diameter. Additional ground-glass nodules demonstrated in the left lung base. Metastasis is suspected. Emphysematous changes in the lungs. Scattered calcified granulomas in the liver. No focal liver lesions otherwise demonstrated. Gallbladder is contracted, likely physiologic. The pancreas, spleen, kidneys, and inferior vena cava are unremarkable. Calcification of abdominal aorta without aneurysm. No definite retroperitoneal lymphadenopathy. Decreased abdominal fat limits evaluation of the retroperitoneum. Stomach and small bowel are decompressed. Contrast material is present in the colon suggesting no evidence of bowel obstruction. Diffusely stool-filled colon. No free air or free fluid demonstrated in the abdomen. Pelvis: Calcification in the prostate gland. Mild thickening of the bladder wall may indicate cystitis or hypertrophy due to outlet obstruction. No free or loculated pelvic fluid collections. Appendix not identified. Rectosigmoid colon is mostly decompressed. There is a bone sclerosis at multiple endplates likely representing discogenic change. No definite destructive bone lesions.  Degenerative changes in the lumbar spine. IMPRESSION: Moderate right pleural effusion with loculation and pleural  nodularity suggesting malignant effusion. Atelectasis in the right lung base. Ground-glass nodules in the left lung base, largest 1.1 cm. This is likely metastatic although inflammatory nodularity not entirely excluded. No definite evidence of malignancy demonstrated in the abdomen or pelvis although technical factors limits evaluation of bowel and colon. Bladder wall thickening may be due to cystitis or outlet obstruction. Electronically Signed   By: Lucienne Capers M.D.   On: 04/27/2015 23:05   Dg Chest Port 1 View  05/11/2015  CLINICAL DATA:  Right-sided Port-A-Cath insertion. EXAM: PORTABLE CHEST 1 VIEW COMPARISON:  05/09/2015 FINDINGS: New right anterior chest wall Port-A-Cath has its catheter tip projecting in the lower superior vena cava. There is no pneumothorax. Right hemi thorax is now almost completely opacified. Patchy opacity persists in the left mid and lower lung zone, stable. No left pleural effusion. IMPRESSION: 1. Right Port-A-Cath is well positioned with its tip in the lower superior vena cava. 2. There has been further worsening of lung aeration on the right now with the right hemi thorax walls completely opacified. 3. No pneumothorax. Electronically Signed   By: Lajean Manes M.D.   On: 05/11/2015 12:56   Dg Chest Port 1 View  04/27/2015  CLINICAL DATA:  Status post bronchoscopy with biopsy EXAM: PORTABLE CHEST 1 VIEW COMPARISON:  April 25, 2015. FINDINGS: The heart size and mediastinal contours are within normal limits. No pneumothorax is noted. Mild right pleural effusion is noted. 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 Stable diffuse opacification of right upper lobe is noted. The visualized skeletal structures are unremarkable. IMPRESSION: Stable right upper lobe opacity is noted concerning for pneumonia. Mild right pleural effusion is noted. Electronically Signed   By: Marijo Conception, M.D.   On: 04/27/2015 10:36   Dg  C-arm 1-60 Min-no Report  05/11/2015  CLINICAL DATA: Portacath placement C-ARM 1-60 MINUTES Fluoroscopy was utilized by the requesting physician.  No radiographic interpretation.   US Thoracentesis Asp Pleural Space W/img Guide  05/09/2015  CLINICAL DATA:  Shortness of breath, recurrent RIGHT pleural effusion EXAM: ULTRASOUND GUIDED 05/04/2015 THORACENTESIS COMPARISON:  None. PROCEDURE: Procedure, benefits, and risks of procedure were discussed with patient. Written informed consent for procedure was obtained. Time out protocol followed. Pleural effusion localized by ultrasound at the posterior RIGHT hemithorax. Skin prepped and draped in usual sterile fashion. Skin and soft tissues anesthetized with 8 mL of 1% lidocaine. 8 French thoracentesis catheter placed into the RIGHT pleural space. 1500 mL of dark old appearing bloody fluid aspirated by syringe pump. Procedure tolerated well by patient without immediate complication. COMPLICATIONS: None FINDINGS: As above IMPRESSION: Successful ultrasound guided RIGHT thoracentesis yielding 1500 mL of dark old appearing bloody pleural fluid. Electronically Signed   By: Lavonia Dana M.D.   On: 05/09/2015 11:17   US Thoracentesis Asp Pleural Space W/img Guide  05/04/2015  CLINICAL DATA:  Right pleural effusion EXAM: ULTRASOUND GUIDED RIGHT THORACENTESIS COMPARISON:  Chest CT 05/03/2015 PROCEDURE: An ultrasound guided thoracentesis was thoroughly discussed with the patient and questions answered. The benefits, risks, alternatives and complications were also discussed. The patient understands and wishes to proceed with the procedure. Written consent was obtained. Ultrasound was performed to localize and mark an adequate pocket of fluid in the right posterior chest. The area was then prepped and draped in the normal sterile fashion. 1% Lidocaine was used  for local anesthesia. Under ultrasound guidance a 19 gauge Yueh catheter was introduced. Thoracentesis was performed.  The catheter was removed and a dressing applied. COMPLICATIONS: None. FINDINGS: A total of approximately 1.4 L of bloody fluid was removed. A fluid sample wassent for laboratory analysis. IMPRESSION: Successful ultrasound guided right thoracentesis yielding 1.4 L of pleural fluid. Electronically Signed   By: Rolm Baptise M.D.   On: 05/04/2015 12:31   US Thoracentesis Asp Pleural Space W/img Guide  04/23/2015  INDICATION: Pneumonia, smoker, cough, dyspnea, chest pain, right pleural effusion. Request is made for diagnostic and therapeutic right thoracentesis. EXAM: ULTRASOUND GUIDED DIAGNOSTIC AND THERAPEUTIC RIGHT THORACENTESIS COMPARISON:  None. MEDICATIONS: None COMPLICATIONS: None immediate TECHNIQUE: Informed written consent was obtained from the patient after a discussion of the risks, benefits and alternatives to treatment. A timeout was performed prior to the initiation of the procedure. Initial ultrasound scanning demonstrates a moderate to large right pleural effusion. The lower chest was prepped and draped in the usual sterile fashion. 1% lidocaine was used for local anesthesia. An ultrasound image was saved for documentation purposes. A 6 Fr Safe-T-Centesis catheter was introduced. The thoracentesis was performed. The catheter was removed and a dressing was applied. The patient tolerated the procedure well without immediate post procedural complication. The patient was escorted to have an upright chest radiograph. FINDINGS: A total of approximately 1.4 liters of turbid, amber fluid was removed. Requested samples were sent to the laboratory. IMPRESSION: Successful ultrasound-guided diagnostic and therapeutic right sided thoracentesis yielding 1.4 liters of pleural fluid. Read by: Rowe Robert, PA-C Electronically Signed   By: Aletta Edouard M.D.   On: 04/23/2015 11:06   Dg C-arm Bronchoscopy  04/27/2015  CLINICAL DATA:  C-ARM BRONCHOSCOPY Fluoroscopy was utilized by the requesting physician.  No  radiographic interpretation.    Labs:  CBC:  Recent Labs  05/08/15 0104 05/09/15 0713 05/11/15 0027 05/12/15 0531  WBC 17.0* 13.0* 11.2* 11.0*  HGB 10.0* 8.8* 8.2* 8.4*  HCT 29.7* 26.8* 24.3* 25.2*  PLT 691* 669* 538* 652*    COAGS:  Recent Labs  04/23/15 0421 05/12/15 0531  INR 1.08 1.23  APTT 37 44*    BMP:  Recent Labs  05/08/15 0104 05/09/15 0713 05/11/15 0027 05/11/15 0530  NA 133* 135 132* 133*  K 4.1 3.9 3.7 4.0  CL 95* 98* 97* 97*  CO2 '30 30 28 29  '$ GLUCOSE 106* 100* 160* 99  BUN '7 6 9 8  '$ CALCIUM 9.0 8.8* 8.5* 8.7*  CREATININE 0.83 0.73 0.84 0.81  GFRNONAA >60 >60 >60 >60  GFRAA >60 >60 >60 >60    LIVER FUNCTION TESTS:  Recent Labs  04/22/15 1953 05/03/15 1814 05/05/15 0939  BILITOT 0.2* 0.2* 0.2*  AST '16 17 19  '$ ALT 11* 32 25  ALKPHOS 87 104 98  PROT 6.6 6.7 5.9*  ALBUMIN 2.7* 2.5* 2.2*    TUMOR MARKERS:  Recent Labs  04/27/15 1740  CEA 96.5*    Assessment and Plan: Patient with history of non-small cell lung cancer and recurrent symptomatic malignant right pleural effusion. Request now received for right Pleurx catheter placement. Imaging studies have been reviewed. Details/risks of procedure, including but not limited to, internal bleeding, infection, pneumothorax, inability to completely drain effusion discussed with patient with his understanding and consent. Procedure scheduled for later this morning.   Thank you for this interesting consult.  I greatly enjoyed meeting Donna Silverman and look forward to participating in their care.  A copy of this report  was sent to the requesting provider on this date.  Signed: D. Rowe Robert 05/12/2015, 11:02 AM   I spent a total of 20 minutes in face to face in clinical consultation, greater than 50% of which was counseling/coordinating care for right Pleurx catheter placement

## 2015-05-12 NOTE — Progress Notes (Signed)
Subjective: Patient is resting. Patient had Port-A-cath placement yesterday. He is planned for pleurx Catheter placement today in [place].  Objective: Vital signs in last 24 hours: Temp:  [97.6 F (36.4 C)-98.6 F (37 C)] 98.2 F (36.8 C) (10/27 0500) Pulse Rate:  [74-103] 88 (10/27 0500) Resp:  [15-24] 18 (10/27 0500) BP: (99-122)/(63-80) 116/73 mmHg (10/27 0500) SpO2:  [89 %-100 %] 90 % (10/27 0753) Weight:  [59.421 kg (131 lb)] 59.421 kg (131 lb) (10/26 1057) Weight change:  Last BM Date: 05/08/15  Intake/Output from previous day: 10/26 0701 - 10/27 0700 In: 3570 [P.O.:720; I.V.:200; IV Piggyback:550] Out: 177 [Urine:725; Blood:10]  PHYSICAL EXAM General appearance: cachectic and no distress Resp: diminished breath sounds bilaterally and rhonchi bilaterally Cardio: S1, S2 normal GI: soft, non-tender; bowel sounds normal; no masses,  no organomegaly Extremities: extremities normal, atraumatic, no cyanosis or edema  Lab Results:  Results for orders placed or performed during the hospital encounter of 05/07/15 (from the past 48 hour(s))  Vitamin B12     Status: None   Collection Time: 05/10/15 11:19 AM  Result Value Ref Range   Vitamin B-12 763 180 - 914 pg/mL    Comment: (NOTE) This assay is not validated for testing neonatal or myeloproliferative syndrome specimens for Vitamin B12 levels. Performed at [facility]   Iron and TIBC     Status: Abnormal   Collection Time: 05/10/15 11:19 AM  Result Value Ref Range   Iron 11 (L) 45 - 182 ug/dL   TIBC 197 (L) 250 - 450 ug/dL   Saturation Ratios 6 (L) 17.9 - 39.5 %   UIBC 186 ug/dL    Comment: Performed at [facility]  Ferritin     Status: None   Collection Time: 05/10/15 11:19 AM  Result Value Ref Range   Ferritin 104 24 - 336 ng/mL    Comment: Performed at [facility]  Reticulocytes     Status: Abnormal   Collection Time: 05/10/15 11:19 AM  Result Value Ref Range   Retic Ct Pct 1.4 0.4 -  3.1 %   RBC. 3.07 (L) 4.22 - 5.81 MIL/uL   Retic Count, Manual 43.0 19.0 - 186.0 K/uL  Folate     Status: None   Collection Time: 05/10/15 11:21 AM  Result Value Ref Range   Folate 16.8 >5.9 ng/mL    Comment: Performed at [facility]  CBC     Status: Abnormal   Collection Time: 05/11/15 12:27 AM  Result Value Ref Range   WBC 11.2 (H) 4.0 - 10.5 K/uL   RBC 2.83 (L) 4.22 - 5.81 MIL/uL   Hemoglobin 8.2 (L) 13.0 - 17.0 g/dL   HCT 24.3 (L) 39.0 - 52.0 %   MCV 85.9 78.0 - 100.0 fL   MCH 29.0 26.0 - 34.0 pg   MCHC 33.7 30.0 - 36.0 g/dL   RDW 14.2 11.5 - 15.5 %   Platelets 538 (H) 150 - 400 K/uL  Basic metabolic panel     Status: Abnormal   Collection Time: 05/11/15 12:27 AM  Result Value Ref Range   Sodium 132 (L) 135 - 145 mmol/L   Potassium 3.7 3.5 - 5.1 mmol/L   Chloride 97 (L) 101 - 111 mmol/L   CO2 28 22 - 32 mmol/L   Glucose, Bld 160 (H) 65 - 99 mg/dL   BUN 9 6 - 20 mg/dL   Creatinine, Ser 0.84 0.61 - 1.24 mg/dL   Calcium 8.5 (L) 8.9 -  10.3 mg/dL   GFR calc non Af Amer >60 >60 mL/min   GFR calc Af Amer >60 >60 mL/min    Comment: (NOTE) The eGFR has been calculated using the CKD EPI equation. This calculation has not been validated in all clinical situations. eGFR's persistently <60 mL/min signify possible Chronic Kidney Disease.    Anion gap 7 5 - 15  Surgical pcr screen     Status: None   Collection Time: 05/11/15  1:05 AM  Result Value Ref Range   MRSA, PCR NEGATIVE NEGATIVE   Staphylococcus aureus NEGATIVE NEGATIVE    Comment:        The Xpert SA Assay (FDA approved for NASAL specimens in patients over 23 years of age), is one component of a comprehensive surveillance program.  Test performance has been validated by [facility] for patients greater than or equal to 19 year old. It is not intended to diagnose infection nor to guide or monitor treatment.   Basic metabolic panel     Status: Abnormal   Collection Time: 05/11/15  5:30 AM  Result Value  Ref Range   Sodium 133 (L) 135 - 145 mmol/L   Potassium 4.0 3.5 - 5.1 mmol/L   Chloride 97 (L) 101 - 111 mmol/L   CO2 29 22 - 32 mmol/L   Glucose, Bld 99 65 - 99 mg/dL   BUN 8 6 - 20 mg/dL   Creatinine, Ser 0.81 0.61 - 1.24 mg/dL   Calcium 8.7 (L) 8.9 - 10.3 mg/dL   GFR calc non Af Amer >60 >60 mL/min   GFR calc Af Amer >60 >60 mL/min    Comment: (NOTE) The eGFR has been calculated using the CKD EPI equation. This calculation has not been validated in all clinical situations. eGFR's persistently <60 mL/min signify possible Chronic Kidney Disease.    Anion gap 7 5 - 15  Protime-INR     Status: Abnormal   Collection Time: 05/12/15  5:31 AM  Result Value Ref Range   Prothrombin Time 15.6 (H) 11.6 - 15.2 seconds   INR 1.23 0.00 - 1.49  APTT     Status: Abnormal   Collection Time: 05/12/15  5:31 AM  Result Value Ref Range   aPTT 44 (H) 24 - 37 seconds    Comment:        IF BASELINE aPTT IS ELEVATED, SUGGEST PATIENT RISK ASSESSMENT BE USED TO DETERMINE APPROPRIATE ANTICOAGULANT THERAPY.   CBC     Status: Abnormal   Collection Time: 05/12/15  5:31 AM  Result Value Ref Range   WBC 11.0 (H) 4.0 - 10.5 K/uL   RBC 2.95 (L) 4.22 - 5.81 MIL/uL   Hemoglobin 8.4 (L) 13.0 - 17.0 g/dL   HCT 25.2 (L) 39.0 - 52.0 %   MCV 85.4 78.0 - 100.0 fL   MCH 28.5 26.0 - 34.0 pg   MCHC 33.3 30.0 - 36.0 g/dL   RDW 14.2 11.5 - 15.5 %   Platelets 652 (H) 150 - 400 K/uL    ABGS No results for input(s): PHART, PO2ART, TCO2, HCO3 in the last 72 hours.  Invalid input(s): PCO2 CULTURES Recent Results (from the past 240 hour(s))  Culture, blood (routine x 2)     Status: None (Preliminary result)   Collection Time: 05/08/15  3:45 AM  Result Value Ref Range Status   Specimen Description BLOOD RIGHT ANTECUBITAL  Final   Special Requests BOTTLES DRAWN AEROBIC AND ANAEROBIC 6CC  Final   Culture NO GROWTH 3  DAYS  Final   Report Status PENDING  Incomplete  Culture, blood (routine x 2)     Status: None  (Preliminary result)   Collection Time: 05/08/15  3:55 AM  Result Value Ref Range Status   Specimen Description BLOOD RIGHT ARM  Final   Special Requests BOTTLES DRAWN AEROBIC AND ANAEROBIC 6CC  Final   Culture NO GROWTH 3 DAYS  Final   Report Status PENDING  Incomplete  Surgical pcr screen     Status: None   Collection Time: 05/11/15  1:05 AM  Result Value Ref Range Status   MRSA, PCR NEGATIVE NEGATIVE Final   Staphylococcus aureus NEGATIVE NEGATIVE Final    Comment:        The Xpert SA Assay (FDA approved for NASAL specimens in patients over 67 years of age), is one component of a comprehensive surveillance program.  Test performance has been validated by [facility] for patients greater than or equal to 44 year old. It is not intended to diagnose infection nor to guide or monitor treatment.    Studies/Results: Dg Chest Port 1 View  05/11/2015  CLINICAL DATA:  Right-sided Port-A-Cath insertion. EXAM: PORTABLE CHEST 1 VIEW COMPARISON:  05/09/2015 FINDINGS: New right anterior chest wall Port-A-Cath has its catheter tip projecting in the lower superior vena cava. There is no pneumothorax. Right hemi thorax is now almost completely opacified. Patchy opacity persists in the left mid and lower lung zone, stable. No left pleural effusion. IMPRESSION: 1. Right Port-A-Cath is well positioned with its tip in the lower superior vena cava. 2. There has been further worsening of lung aeration on the right now with the right hemi thorax walls completely opacified. 3. No pneumothorax. Electronically Signed   By: Lajean Manes M.D.   On: 05/11/2015 12:56   Dg C-arm 1-60 Min-no Report  05/11/2015  CLINICAL DATA: Portacath placement C-ARM 1-60 MINUTES Fluoroscopy was utilized by the requesting physician.  No radiographic interpretation.    Medications: I have reviewed the patient's current medications.  Assesment:   Active Problems:   Leukocytosis   Recurrent pleural effusion on right    Pleural effusion R/O pneumonia  (health care associated) COPD Anemia  Plan:  Medications reviewed Will continue IV antibiotics Continue nebulizer treatment Pleurx Catheter placement today as planned..    LOS: 4 days   Charlestine Rookstool 05/12/2015, 8:13 AM

## 2015-05-13 LAB — BASIC METABOLIC PANEL
Anion gap: 7 (ref 5–15)
BUN: 7 mg/dL (ref 6–20)
CHLORIDE: 97 mmol/L — AB (ref 101–111)
CO2: 30 mmol/L (ref 22–32)
CREATININE: 0.72 mg/dL (ref 0.61–1.24)
Calcium: 8.6 mg/dL — ABNORMAL LOW (ref 8.9–10.3)
GFR calc Af Amer: >60 mL/min (ref >60)
GLUCOSE: 116 mg/dL — AB (ref 65–99)
POTASSIUM: 3.8 mmol/L (ref 3.5–5.1)
Sodium: 134 mmol/L — ABNORMAL LOW (ref 135–145)

## 2015-05-13 LAB — CULTURE, BLOOD (ROUTINE X 2)
CULTURE: NO GROWTH
Culture: NO GROWTH

## 2015-05-13 NOTE — Progress Notes (Signed)
Subjective: He says he feels okay. He had Pleurx catheter placed yesterday.  Objective: Vital signs in last 24 hours: Temp:  [98 F (36.7 C)-98.5 F (36.9 C)] 98 F (36.7 C) (10/28 0453) Pulse Rate:  [89-108] 89 (10/28 0453) Resp:  [13-24] 18 (10/28 0453) BP: (97-132)/(49-78) 117/74 mmHg (10/28 0453) SpO2:  [90 %-98 %] 98 % (10/28 0746) Weight change:  Last BM Date: 05/11/15  Intake/Output from previous day: 10/27 0701 - 10/28 0700 In: 360 [P.O.:360] Out: 1875 [Urine:675]  PHYSICAL EXAM General appearance: alert, cooperative and no distress Resp: rhonchi bilaterally Cardio: regular rate and rhythm, S1, S2 normal, no murmur, click, rub or gallop GI: soft, non-tender; bowel sounds normal; no masses,  no organomegaly Extremities: extremities normal, atraumatic, no cyanosis or edema  Lab Results:  Results for orders placed or performed during the hospital encounter of 05/07/15 (from the past 48 hour(s))  Protime-INR     Status: Abnormal   Collection Time: 05/12/15  5:31 AM  Result Value Ref Range   Prothrombin Time 15.6 (H) 11.6 - 15.2 seconds   INR 1.23 0.00 - 1.49  APTT     Status: Abnormal   Collection Time: 05/12/15  5:31 AM  Result Value Ref Range   aPTT 44 (H) 24 - 37 seconds    Comment:        IF BASELINE aPTT IS ELEVATED, SUGGEST PATIENT RISK ASSESSMENT BE USED TO DETERMINE APPROPRIATE ANTICOAGULANT THERAPY.   CBC     Status: Abnormal   Collection Time: 05/12/15  5:31 AM  Result Value Ref Range   WBC 11.0 (H) 4.0 - 10.5 K/uL   RBC 2.95 (L) 4.22 - 5.81 MIL/uL   Hemoglobin 8.4 (L) 13.0 - 17.0 g/dL   HCT 25.2 (L) 39.0 - 52.0 %   MCV 85.4 78.0 - 100.0 fL   MCH 28.5 26.0 - 34.0 pg   MCHC 33.3 30.0 - 36.0 g/dL   RDW 14.2 11.5 - 15.5 %   Platelets 652 (H) 150 - 400 K/uL  Basic metabolic panel     Status: Abnormal   Collection Time: 05/13/15  6:18 AM  Result Value Ref Range   Sodium 134 (L) 135 - 145 mmol/L   Potassium 3.8 3.5 - 5.1 mmol/L   Chloride 97 (L)  101 - 111 mmol/L   CO2 30 22 - 32 mmol/L   Glucose, Bld 116 (H) 65 - 99 mg/dL   BUN 7 6 - 20 mg/dL   Creatinine, Ser 0.72 0.61 - 1.24 mg/dL   Calcium 8.6 (L) 8.9 - 10.3 mg/dL   GFR calc non Af Amer >60 >60 mL/min   GFR calc Af Amer >60 >60 mL/min    Comment: (NOTE) The eGFR has been calculated using the CKD EPI equation. This calculation has not been validated in all clinical situations. eGFR's persistently <60 mL/min signify possible Chronic Kidney Disease.    Anion gap 7 5 - 15    ABGS No results for input(s): PHART, PO2ART, TCO2, HCO3 in the last 72 hours.  Invalid input(s): PCO2 CULTURES Recent Results (from the past 240 hour(s))  Culture, blood (routine x 2)     Status: None   Collection Time: 05/08/15  3:45 AM  Result Value Ref Range Status   Specimen Description BLOOD RIGHT ANTECUBITAL  Final   Special Requests BOTTLES DRAWN AEROBIC AND ANAEROBIC [place]  Final   Culture NO GROWTH 5 DAYS  Final   Report Status 05/13/2015 FINAL  Final  Culture, blood (routine  x 2)     Status: None   Collection Time: 05/08/15  3:55 AM  Result Value Ref Range Status   Specimen Description BLOOD RIGHT ARM  Final   Special Requests BOTTLES DRAWN AEROBIC AND ANAEROBIC 6CC  Final   Culture NO GROWTH 5 DAYS  Final   Report Status 05/13/2015 FINAL  Final  Surgical pcr screen     Status: None   Collection Time: 05/11/15  1:05 AM  Result Value Ref Range Status   MRSA, PCR NEGATIVE NEGATIVE Final   Staphylococcus aureus NEGATIVE NEGATIVE Final    Comment:        The Xpert SA Assay (FDA approved for NASAL specimens in patients over 61 years of age), is one component of a comprehensive surveillance program.  Test performance has been validated by [facility] for patients greater than or equal to 65 year old. It is not intended to diagnose infection nor to guide or monitor treatment.    Studies/Results: Ir Lenise Arena W Catheter Placement  05/12/2015  CLINICAL DATA:  Malignant pleural  effusion, shortness of breath EXAM: INSERTION OF TUNNELED PLEURAL DRAINAGE CATHETER ANESTHESIA/SEDATION: Intravenous Fentanyl and Versed were administered as conscious sedation during continuous cardiorespiratory monitoring by the radiology RN, with a total moderate sedation time of 10 minutes. MEDICATIONS: As antibiotic prophylaxis, cefazolin 1 g was ordered pre-procedure and administered intravenously within one hour of incision. FLUOROSCOPY TIME:  42 seconds, 36 mGy. PROCEDURE: The procedure, risks, benefits, and alternatives were explained to the patient. Questions regarding the procedure were encouraged and answered. The patient understands and consents to the procedure. The right chest wall was prepped with Betadine in a sterile fashion, and a sterile drape was applied covering the operative field. A sterile gown and sterile gloves were used for the procedure. Local anesthesia was provided with 1% Lidocaine. Ultrasound image documentation was performed. Fluoroscopy during the procedure and fluoroscopic spot radiograph confirms appropriate catheter position. After creating a small skin incision, a 19 gauge needle was advanced into the pleural cavity under ultrasound guidance. A guide wire was then advanced under fluoroscopy into the pleural space. Pleural access was dilated serially and a 16-French peel-away sheath placed. A 16 French tunneled PleurX catheter was placed. This was tunneled from an incision 5 cm below the pleural access to the access site. The catheter was advanced through the peel-away sheath. The sheath was then removed. Final catheter positioning was confirmed with a fluoroscopic spot image. The access incision was closed with Dermabond applied to the incision. A Prolene retention suture was applied at the catheter exit site. Large volume thoracentesis was performed through the new catheter utilizing gravity drainage bag. COMPLICATIONS: None. FINDINGS: The catheter was placed via the right  lateral chest wall. Catheter course is towards the apex. Approximately 1.0 liters of blood-tinged pleural fluid was able to be removed after catheter placement. The patient will be brought back in 10 to 14days to remove the temporary exit site retention suture after appropriate in-growth around the subcutaneous cuff of the catheter. IMPRESSION: Placement of permanent, tunneled right pleural drainage catheter via lateral approach. 1.0 liters of pleural fluid was removed today after catheter placement. Electronically Signed   By: Lucrezia Europe M.D.   On: 05/12/2015 14:30   Ir US Guide Bx Asp/drain  05/12/2015  CLINICAL DATA:  Malignant pleural effusion, shortness of breath EXAM: INSERTION OF TUNNELED PLEURAL DRAINAGE CATHETER ANESTHESIA/SEDATION: Intravenous Fentanyl and Versed were administered as conscious sedation during continuous cardiorespiratory monitoring by the radiology RN,  with a total moderate sedation time of 10 minutes. MEDICATIONS: As antibiotic prophylaxis, cefazolin 1 g was ordered pre-procedure and administered intravenously within one hour of incision. FLUOROSCOPY TIME:  42 seconds, 36 mGy. PROCEDURE: The procedure, risks, benefits, and alternatives were explained to the patient. Questions regarding the procedure were encouraged and answered. The patient understands and consents to the procedure. The right chest wall was prepped with Betadine in a sterile fashion, and a sterile drape was applied covering the operative field. A sterile gown and sterile gloves were used for the procedure. Local anesthesia was provided with 1% Lidocaine. Ultrasound image documentation was performed. Fluoroscopy during the procedure and fluoroscopic spot radiograph confirms appropriate catheter position. After creating a small skin incision, a 19 gauge needle was advanced into the pleural cavity under ultrasound guidance. A guide wire was then advanced under fluoroscopy into the pleural space. Pleural access was  dilated serially and a 16-French peel-away sheath placed. A 16 French tunneled PleurX catheter was placed. This was tunneled from an incision 5 cm below the pleural access to the access site. The catheter was advanced through the peel-away sheath. The sheath was then removed. Final catheter positioning was confirmed with a fluoroscopic spot image. The access incision was closed with Dermabond applied to the incision. A Prolene retention suture was applied at the catheter exit site. Large volume thoracentesis was performed through the new catheter utilizing gravity drainage bag. COMPLICATIONS: None. FINDINGS: The catheter was placed via the right lateral chest wall. Catheter course is towards the apex. Approximately 1.0 liters of blood-tinged pleural fluid was able to be removed after catheter placement. The patient will be brought back in 10 to 14days to remove the temporary exit site retention suture after appropriate in-growth around the subcutaneous cuff of the catheter. IMPRESSION: Placement of permanent, tunneled right pleural drainage catheter via lateral approach. 1.0 liters of pleural fluid was removed today after catheter placement. Electronically Signed   By: Corlis Leak M.D.   On: 05/12/2015 14:30   Dg Chest Port 1 View  05/11/2015  CLINICAL DATA:  Right-sided Port-A-Cath insertion. EXAM: PORTABLE CHEST 1 VIEW COMPARISON:  05/09/2015 FINDINGS: New right anterior chest wall Port-A-Cath has its catheter tip projecting in the lower superior vena cava. There is no pneumothorax. Right hemi thorax is now almost completely opacified. Patchy opacity persists in the left mid and lower lung zone, stable. No left pleural effusion. IMPRESSION: 1. Right Port-A-Cath is well positioned with its tip in the lower superior vena cava. 2. There has been further worsening of lung aeration on the right now with the right hemi thorax walls completely opacified. 3. No pneumothorax. Electronically Signed   By: Amie Portland  M.D.   On: 05/11/2015 12:56   Dg C-arm 1-60 Min-no Report  05/11/2015  CLINICAL DATA: Portacath placement C-ARM 1-60 MINUTES Fluoroscopy was utilized by the requesting physician.  No radiographic interpretation.    Medications:  Prior to Admission:  Prescriptions prior to admission  Medication Sig Dispense Refill Last Dose  . oxyCODONE-acetaminophen (PERCOCET/ROXICET) 5-325 MG tablet Take 1-2 tablets by mouth every 4 (four) hours as needed for moderate pain or severe pain. 30 tablet 0    Scheduled: . enoxaparin (LOVENOX) injection  40 mg Subcutaneous Q24H  . ipratropium-albuterol  3 mL Nebulization TID  . piperacillin-tazobactam (ZOSYN)  IV  3.375 g Intravenous Q8H  . vancomycin  1,500 mg Intravenous Q12H   Continuous:  [id number] **OR** acetaminophen, HYDROcodone-acetaminophen, HYDROmorphone (DILAUDID) injection, ondansetron **OR** ondansetron (ZOFRAN) IV  Assesment: He has what appears to be stage IV lung cancer. He has recurrent pleural effusion. He now has Pleurx catheter. I discussed this with the patient and with his family at bedside and explained that although he needs that now if he responds well to chemotherapy he may not need that long-term. I think he'll be ready for discharge tomorrow Active Problems:   Leukocytosis   Recurrent pleural effusion on right   Pleural effusion    Plan: Continue drainage. Probable discharge tomorrow    LOS: 5 days   Jasraj Lappe L 05/13/2015, 9:12 AM

## 2015-05-13 NOTE — Progress Notes (Signed)
Patient's pleurx catheter drained.  472m drained out of patient.  Patient tolerated procedure well.  New bandage placed over site. No signs or symptoms of distress  Noted.

## 2015-05-13 NOTE — Care Management Note (Signed)
Case Management Note  Patient Details  Name: Naeem Quillin MRN: [id number] Date of Birth: 01/07/1961  Subjective/Objective:                    Action/Plan:   Expected Discharge Date:                  Expected Discharge Plan:  [place]  In-House Referral:  NA  Discharge planning Services  CM Consult  Post Acute Care Choice:  NA Choice offered to:  NA  DME Arranged:    DME Agency:     HH Arranged:    HH Agency:     Status of Service:  Completed, signed off  Medicare Important Message Given:    Date Medicare IM Given:    Medicare IM give by:    Date Additional Medicare IM Given:    Additional Medicare Important Message give by:     If discussed at [place] of Stay Meetings, dates discussed:    Additional Comments: Per MD pt will be ready for discharge over the weekend. Pts home health RN arranged with [facility] (per pts choice). Romualdo Bolk of [facility] is aware and will collect the pts information from the chart. [place] services to start within 48 hours of discharge. Weekend staff to call and fax orders at discharge. Weekend staff to perform home O2 asessment prior to discharge and if qualifies can arrange home O2 with [place]. Paperwork for mail order supplies for pleurx cathter have been faxed and mailed to [place] so pt can receive further supplies by mail. Hospital will send beginning supplies home with pt to bridge pt until mail order supplies are received. Pt and pts nurse aware of discharge arrangements. Christinia Gully [place], RN 05/13/2015, 1:29 PM

## 2015-05-13 NOTE — Progress Notes (Signed)
ANTIBIOTIC CONSULT NOTE-  Pharmacy Consult for Vancomycin and Zosyn Indication: Pneumonia  No Known Allergies  Patient Measurements: Height: '5\' 8"'$  (172.7 cm) Weight: 131 lb (59.421 kg) IBW/kg (Calculated) : 68.4  Vital Signs: Temp: 98.3 F (36.8 C) (10/28 0913) Temp Source: Oral (10/28 0913) BP: 97/66 mmHg (10/28 0913) Pulse Rate: 93 (10/28 0913)  Labs:  Recent Labs  05/11/15 0027 05/11/15 0530 05/12/15 0531 05/13/15 0618  WBC 11.2*  --  11.0*  --   HGB 8.2*  --  8.4*  --   PLT 538*  --  652*  --   CREATININE 0.84 0.81  --  0.72   Estimated Creatinine Clearance: 90.8 mL/min (by C-G formula based on Cr of 0.72).  No results for input(s): VANCOTROUGH, VANCOPEAK, VANCORANDOM, GENTTROUGH, GENTPEAK, GENTRANDOM, TOBRATROUGH, TOBRAPEAK, TOBRARND, AMIKACINPEAK, AMIKACINTROU, AMIKACIN in the last 72 hours.  Microbiology: Recent Results (from the past 720 hour(s))  Culture, body fluid-bottle     Status: None   Collection Time: 04/23/15 11:18 AM  Result Value Ref Range Status   Specimen Description FLUID PLEURAL RIGHT  Final   Special Requests NONE  Final   Culture NO GROWTH 5 DAYS  Final   Report Status 04/28/2015 FINAL  Final  Gram stain     Status: None   Collection Time: 04/23/15 11:18 AM  Result Value Ref Range Status   Specimen Description FLUID PLEURAL RIGHT  Final   Special Requests NONE  Final   Gram Stain   Final    FEW WBC PRESENT, PREDOMINANTLY MONONUCLEAR NO ORGANISMS SEEN    Report Status 04/23/2015 FINAL  Final  Culture, respiratory (NON-Expectorated)     Status: None   Collection Time: 04/27/15  9:25 AM  Result Value Ref Range Status   Specimen Description BRONCHIAL ALVEOLAR LAVAGE  Final   Special Requests Normal  Final   Gram Stain   Final    FEW WBC PRESENT,BOTH PMN AND MONONUCLEAR NO SQUAMOUS EPITHELIAL CELLS SEEN NO ORGANISMS SEEN Performed at [facility]    Culture   Final    NO GROWTH 2 DAYS Performed at [facility]    Report Status 04/29/2015 FINAL  Final  Fungus Culture with Smear     Status: None (Preliminary result)   Collection Time: 04/27/15  9:25 AM  Result Value Ref Range Status   Specimen Description BRONCHIAL ALVEOLAR LAVAGE  Final   Special Requests Normal  Final   Fungal Smear   Final    NO YEAST OR FUNGAL ELEMENTS SEEN Performed at [facility]    Culture   Final    CULTURE IN PROGRESS FOR FOUR WEEKS Performed at [facility]    Report Status PENDING  Incomplete  AFB culture with smear     Status: None (Preliminary result)   Collection Time: 04/27/15  9:25 AM  Result Value Ref Range Status   Specimen Description BRONCHIAL ALVEOLAR LAVAGE  Final   Special Requests Normal  Final   Acid Fast Smear   Final    NO ACID FAST BACILLI SEEN Performed at [facility]    Culture   Final    CULTURE WILL BE EXAMINED FOR 6 WEEKS BEFORE ISSUING A FINAL REPORT Performed at [facility]    Report Status PENDING  Incomplete  Culture, blood (routine x 2)     Status: None   Collection Time: 05/08/15  3:45 AM  Result Value Ref Range Status   Specimen Description BLOOD RIGHT ANTECUBITAL  Final   Special Requests BOTTLES DRAWN AEROBIC AND ANAEROBIC 6CC  Final   Culture NO GROWTH 5 DAYS  Final   Report Status 05/13/2015 FINAL  Final  Culture, blood (routine x 2)     Status: None   Collection Time: 05/08/15  3:55 AM  Result Value Ref Range Status   Specimen Description BLOOD RIGHT ARM  Final   Special Requests BOTTLES DRAWN AEROBIC AND ANAEROBIC 6CC  Final   Culture NO GROWTH 5 DAYS  Final   Report Status 05/13/2015 FINAL  Final  Surgical pcr screen     Status: None   Collection Time: 05/11/15  1:05 AM  Result Value Ref Range Status   MRSA, PCR NEGATIVE NEGATIVE Final   Staphylococcus aureus NEGATIVE NEGATIVE Final    Comment:        The Xpert SA Assay (FDA approved for NASAL specimens in patients over 36 years of age), is one component of a comprehensive  surveillance program.  Test performance has been validated by [facility] for patients greater than or equal to 57 year old. It is not intended to diagnose infection nor to guide or monitor treatment.     Medical History: Past Medical History  Diagnosis Date  . Tobacco use disorder 04/23/2015    Quit Fall 2016  . ETOH abuse     Quit Fall 2016  . Illicit drug use     Quit Fall 2016   Assessment: 54 yo male recently discharged from hospital with new lung cancer diagnosis. Pt returns with complaints of increased SOB. CT showed right pleural effusion. Empiric antibiotics for pneumonia vs neoplasm. Pt has excellent renal fxn and vanco clearance.. Patient did not receive 1100 dose yesterday AM d/t pleurx catheter placement in [place].(1 liter of fluid removed initially and 437ms drained this am). Plan is to discharge home in AM, if not will recheck vanc trough to ensure adequate dosing. Unable to check today d/t missed dose. Will recheck vancomycin trough in am prior to 1100 dose if warranted.  Pharmacokinetic dosing service     Vancomycin single level analysis: Current dose being given: 500 mg Current dosing interval:  8 hrs Current infusion time (hrs): 1  Single level Trough Data:  Trough level obtained: 11 mcg/ml Timing of trough - Number of hours since last dose:  7 Hrs Desired peak:  35 mcg/ml  Desired trough: 15 mcg/ml  Estimated PK Parameters: --------------------------- New rate constant (kel): 0.105 hr-1 New half-life: 6.60 Hours New Vd from levels: 41.86  Liters  (0.7 L/kg)  Recommendations: ==================== Give Vancomycin  1500 mg  q 12 hrs. Infuse over 1.5 hrs Expected Cpeak: 47.5 mcg/ml    Expected Ctrough: 15.0 mcg/ml  Recommended labs and intervals: Measure Bun and Scr 3 times/week.   Thank you for the consult, will continue to follow.  Goal of Therapy:  Vancomycin troughs 15-20 mcg/ml Eradicated infection  Plan:  continue Vancomycin to  '1500mg'$  IV q12hrs Zosyn 3.375 GM IV every 8 hours Recheck Vancomycin trough at steady state Labs per protocol, f/u micro  PCristy Friedlander [place]10/28/2016,11:06 AM

## 2015-05-14 MED ORDER — FLUTICASONE-SALMETEROL 250-50 MCG/DOSE IN AEPB: 1.0000 | INHALATION_SPRAY | Freq: Two times a day (BID) | RESPIRATORY_TRACT | Status: AC

## 2015-05-14 MED ORDER — ALBUTEROL SULFATE HFA 108 (90 BASE) MCG/ACT IN AERS: 2.0000 | INHALATION_SPRAY | Freq: Four times a day (QID) | RESPIRATORY_TRACT | Status: DC | PRN

## 2015-05-14 MED ORDER — AMOXICILLIN-POT CLAVULANATE 875-125 MG PO TABS: 1.0000 | ORAL_TABLET | Freq: Two times a day (BID) | ORAL | Status: DC

## 2015-05-14 MED ORDER — OXYCODONE-ACETAMINOPHEN 5-325 MG PO TABS: 1.0000 | ORAL_TABLET | ORAL | Status: DC | PRN

## 2015-05-14 MED ORDER — ONDANSETRON HCL 4 MG PO TABS: 4.0000 mg | ORAL_TABLET | Freq: Three times a day (TID) | ORAL | Status: DC | PRN

## 2015-05-14 MED ORDER — PREDNISONE 10 MG PO TABS: ORAL_TABLET | ORAL | Status: DC

## 2015-05-14 MED ORDER — IPRATROPIUM-ALBUTEROL 0.5-2.5 (3) MG/3ML IN SOLN: 3.0000 mL | Freq: Three times a day (TID) | RESPIRATORY_TRACT | Status: DC

## 2015-05-14 MED ORDER — IPRATROPIUM-ALBUTEROL 0.5-2.5 (3) MG/3ML IN SOLN: 3.0000 mL | Freq: Four times a day (QID) | RESPIRATORY_TRACT | Status: DC | PRN

## 2015-05-14 NOTE — Discharge Summary (Signed)
Physician Discharge Summary  Patient ID: Brian Nelson MRN: [id number] DOB/AGE: 1961/01/03 54 y.o. Primary Care Physician:[facility] date: 05/07/2015 Discharge date: 05/14/2015    Discharge Diagnoses:  Lung cancer,adenocarcinoma Active Problems:   Leukocytosis   Recurrent pleural effusion on right   Pleural effusion copd    Medication List    TAKE these medications        albuterol 108 (90 BASE) MCG/ACT inhaler  Commonly known as:  PROVENTIL HFA;VENTOLIN HFA  Inhale 2 puffs into the lungs every 6 (six) hours as needed for wheezing or shortness of breath.     amoxicillin-clavulanate 875-125 MG tablet  Commonly known as:  AUGMENTIN  Take 1 tablet by mouth 2 (two) times daily.     Fluticasone-Salmeterol 250-50 MCG/DOSE Aepb  Commonly known as:  ADVAIR DISKUS  Inhale 1 puff into the lungs 2 (two) times daily.     ipratropium-albuterol 0.5-2.5 (3) MG/3ML Soln  Commonly known as:  DUONEB  Take 3 mLs by nebulization 3 (three) times daily.     ondansetron 4 MG tablet  Commonly known as:  ZOFRAN  Take 1 tablet (4 mg total) by mouth every 8 (eight) hours as needed for nausea or vomiting.     oxyCODONE-acetaminophen 5-325 MG tablet  Commonly known as:  PERCOCET/ROXICET  Take 1-2 tablets by mouth every 4 (four) hours as needed for moderate pain or severe pain.     predniSONE 10 MG tablet  Commonly known as:  DELTASONE  4x3d,3x3d,2x3d,1x3d then stop        Discharged Condition: Improved    Consults: Pulmonary, general surgery, interventional radiology  Significant Diagnostic Studies: Dg Chest 1 View  05/09/2015  CLINICAL DATA:  Post RIGHT thoracentesis EXAM: CHEST 1 VIEW COMPARISON:  Expiratory chest radiograph compared to 05/04/2015 and correlated with CT chest of 05/08/2015 FINDINGS: Stable heart size. Persistent loculated RIGHT pleural effusion identified with scattered areas of atelectasis and consolidation within RIGHT lung.  Underlying emphysematous changes with persistent infiltrates in the mid to lower LEFT lung. No pneumothorax identified. No acute osseous findings. IMPRESSION: No pneumothorax following RIGHT thoracentesis. Persistent loculated RIGHT pleural effusion with areas of atelectasis and consolidation of RIGHT lung. Persistent infiltrate LEFT lower lobe. Findings discussed with patient. Patient asymptomatic following procedure. Electronically Signed   By: Lavonia Dana M.D.   On: 05/09/2015 10:41   Dg Chest 1 View  05/04/2015  CLINICAL DATA:  Post right thoracentesis EXAM: CHEST 1 VIEW COMPARISON:  05/03/2015 FINDINGS: Decreasing right effusion. Moderate partially loculated right effusion persists with diffuse right lung airspace disease. No pneumothorax. Underlying COPD. Linear scarring or atelectasis in the left upper lobe. Heart is normal size. IMPRESSION: Decreasing right effusion with moderate partially loculated right effusion persisting. No pneumothorax. Diffuse right lung airspace disease. COPD. Electronically Signed   By: Rolm Baptise M.D.   On: 05/04/2015 12:30   Dg Chest 1 View  04/23/2015  CLINICAL DATA:  Right thoracentesis. EXAM: CHEST 1 VIEW COMPARISON:  04/22/2015 FINDINGS: Much less pleural density in the right lower chest compared to the yesterday study. Right upper lobe infiltrate with opacified bullous disease at the right apex appears unchanged. Severe emphysema again noted on the left with increased interstitial markings. No pneumothorax post procedure. IMPRESSION: No complication following right paracentesis. Much less pleural density at the right lung base. Persistent right upper lobe pneumonia with opacified bullous disease at the apex. Electronically Signed   By: Nelson Chimes M.D.   On: 04/23/2015 11:23  Dg Chest 2 View  05/03/2015  CLINICAL DATA:  Shortness of breath, mild chest pain, had fluid drawn from RIGHT hemi thorax last week EXAM: CHEST  2 VIEW COMPARISON:  04/27/2015 FINDINGS:  Subtotal opacification of the RIGHT hemi thorax by a combination of fluid and increased lung atelectasis versus consolidation. Slight mediastinal shift to the LEFT. Stable heart size and pulmonary vascularity. Underlying emphysematous changes with linear subsegmental atelectasis versus scarring in LEFT upper lobe. Questionable mild LEFT perihilar infiltrate. No LEFT pleural effusion or pneumothorax. IMPRESSION: Increased opacification of the RIGHT hemi thorax by combination of pleural effusion and increased RIGHT lung atelectasis versus consolidation. COPD changes with atelectasis versus scarring in LEFT upper lobe and question mild LEFT perihilar infiltrate. Electronically Signed   By: Lavonia Dana M.D.   On: 05/03/2015 17:56   Dg Chest 2 View  04/25/2015  CLINICAL DATA:  Pleural effusion EXAM: CHEST  2 VIEW COMPARISON:  04/23/2015 FINDINGS: Dense consolidation again noted in the right upper lobe. Small right pleural effusion. Underlying COPD with bullous changes in the apices. No confluent opacity on the left. Heart is normal size. IMPRESSION: Small right pleural effusion. Dense consolidation persists in the right upper lobe. Severe COPD. Electronically Signed   By: Rolm Baptise M.D.   On: 04/25/2015 11:26   Dg Chest 2 View  04/22/2015  CLINICAL DATA:  Right-sided chest pain and shortness of breath since yesterday. EXAM: CHEST  2 VIEW COMPARISON:  None. FINDINGS: Cardiomediastinal silhouette is normal. Mediastinal contours appear intact. The lungs are hyperinflated, and there is evidence of emphysematous changes with upper lobe predominance. There is a lucency within the left lung apex, which may represent a pneumothorax versus bullous changes. There is a small left pleural effusion. There is a large right pleural effusion. There is near complete opacification of the right upper lobe of the lung. Osseous structures are without acute abnormality. Soft tissues are grossly normal. IMPRESSION: Lung emphysema.  Left apical pneumothorax versus bullous changes of the lung parenchyma. Small left pleural effusion. Large right pleural effusion. Near complete opacification of the right upper lobe of the lung. This may represent infectious consolidation, however endobronchial lesion causing postobstructive pneumonia cannot be excluded. CT of the chest may be considered. Electronically Signed   By: Fidela Salisbury M.D.   On: 04/22/2015 21:11   Ct Chest W Contrast  05/03/2015  CLINICAL DATA:  Chest pain and shortness of breath for couple weeks, recent CT chest with RIGHT pleural effusion, underwent thoracentesis, re-accumulation of fluid in increased infiltrate, history COPD ; cytology positive for malignant effusion though uncertain as to whether pulmonary or GI source EXAM: CT CHEST WITH CONTRAST TECHNIQUE: Multidetector CT imaging of the chest was performed during intravenous contrast administration. Sagittal and coronal MPR images reconstructed from axial data set. CONTRAST:  40m OMNIPAQUE IOHEXOL 300 MG/ML  SOLN IV COMPARISON:  04/22/2015; correlation chest radiograph 05/03/2015 FINDINGS: Mild scattered atherosclerotic calcification aorta and coronary arteries. Persistent large at least partially loculated RIGHT pleural effusion with pleural thickening and scattered enhancement consistent with malignant effusion. Multiple normal sized lymph nodes at RIGHT cardiophrenic angle. Complete atelectasis of RIGHT middle and RIGHT lower lobes. Extensive opacity within LEFT upper lobe without significant volume loss ; this may represent diffuse infiltrate though tumor is not excluded. Abnormal soft tissue at RIGHT hilum suspicious for adenopathy, node 17 mm short axis image 30. Enlarged precarinal lymph node 16 mm short axis image 29. Loculated fluid collection at RIGHT apex. Scattered upper normal sized LEFT  hilar nodes. Visualized upper abdomen unremarkable. Large bulla LEFT apex 12.2 cm diameter. Again identified multiple  subpleural nodular foci in LEFT lung question tumor versus infiltrate, largest 12 mm diameter. Scattered areas of minimal infiltrate in the LEFT upper and LEFT lower lobes are slightly increased since previous exam. No LEFT pleural effusion or pneumothorax. Degenerative disc disease changes lower cervical and mid thoracic spine. No definite osseous metastatic lesions. IMPRESSION: Persistent large at least partially loculated RIGHT pleural effusion with scattered areas of pleural thickening and enhancement consistent with malignant pleural effusion as identified by prior thoracentesis and cytology. Complete atelectasis of RIGHT middle and RIGHT lower lobes. Subtotal opacification of the RIGHT upper lobe may represent diffuse consolidation or tumor. Mediastinal and suspected RIGHT hilar adenopathy with upper normal sized LEFT hilar nodes. Minimal patchy infiltrate LEFT lung increased since previous exam including several subpleural nodular foci in the LEFT upper and LEFT lower lobes, question infection versus tumor. 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 Electronically Signed   By: Lavonia Dana M.D.   On: 05/03/2015 18:59   Ct Chest W Contrast  04/22/2015  CLINICAL DATA:  Intermittent right-sided chest pain and shortness of breath beginning yesterday. History of emphysema. EXAM: CT CHEST WITH CONTRAST TECHNIQUE: Multidetector CT imaging of the chest was performed during intravenous contrast administration. CONTRAST:  153m OMNIPAQUE IOHEXOL 300 MG/ML  SOLN COMPARISON:  Radiography same day FINDINGS: There is a background pattern of advanced emphysema I with extensive bullous changes in the upper lobes. On the left, there is a dominant bulla measuring 12 cm in diameter. In the left lung, there are a few foci of nodular density measuring up to 1 cm in size which will require follow-up after treatment of this acute episode. Some of these could meet criteria for suspicion of malignancy. On the  right, there is a large effusion primarily loculated at the base worrisome for empyema. There is considerable volume loss in the right lower lung because of that. There is consolidation in the right upper lobe with fluid filling Bas lie at the apex consistent with advanced right upper lobe pneumonia. Certainly, a mass lesion could be hidden and this area will will need to be evaluated after treatment. There are slightly prominent lymph nodes in the mediastinum that are likely reactive to the inflammatory process. There is no pneumothorax. Scans in the upper abdomen do not show any abnormality. IMPRESSION: Advanced right upper lobe pneumonia. Extensive empyema at the right lung base. Compressive atelectasis of the right lung. Background pattern of centrilobular emphysema with a 12 cm bulla at the left lung apex. Areas of abnormal density in the left lung which will require follow-up after treatment of this acute inflammatory process. Some of these made meet criteria for suspicion for lung cancer. Electronically Signed   By: MNelson ChimesM.D.   On: 04/22/2015 22:08   Ct Angio Chest Pe W/cm &/or Wo Cm  05/08/2015  CLINICAL DATA:  Hypoxia. History of lung cancer and right pleural effusion. Progressive constant shortness of breath. EXAM: CT ANGIOGRAPHY CHEST WITH CONTRAST TECHNIQUE: Multidetector CT imaging of the chest was performed using the standard protocol during bolus administration of intravenous contrast. Multiplanar CT image reconstructions and MIPs were obtained to evaluate the vascular anatomy. CONTRAST:  101mOMNIPAQUE IOHEXOL 350 MG/ML SOLN COMPARISON:  Chest CT 5 days prior 05/03/2015 FINDINGS: There are no filling defects within the pulmonary arteries to suggest pulmonary embolus. Large partially loculated right pleural effusion has increased in size  from prior. This causes near complete atelectasis of the right lower lobe. There is near complete atelectasis of the right upper lobe with small aerated  portion anteriorly. Complete atelectasis of the right middle lobe. Aerated lung demonstrates ground-glass and consolidation. There is minimal leftward shift in mediastinal structures related to large pleural effusion. Large left apical bulla measures at least 12 cm, unchanged. Advanced emphysema throughout the remainder of the left lung. Multifocal ground-glass opacities in the periphery of the left lung, some of which are nodular. Enlarged left infrahilar lymph nodes, with mild interval increase in size from prior. No definite left pleural effusion. No pericardial effusion. Unchanged enlarged paratracheal lymph nodes. These are partially obscured by adjacent pleural fluid. The heart size is normal. Thoracic aorta is normal in caliber with scattered atherosclerosis. Right epicardial/pericardial lymph nodes changed. Evaluation of the upper abdomen demonstrates no significant change from prior. Mild whole body wall and mesenteric edema. There are no acute or suspicious osseous abnormalities. 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 Review of the MIP images confirms the above findings. IMPRESSION: 1. No pulmonary embolus. 2. Large partially loculated right pleural effusion with near complete atelectasis and consolidation of the right lung. The pleural effusion has increased in size compared to exam 5 days prior. 3. Advanced emphysema with large bulla in the left upper lobe. Multifocal peripheral ground-glass opacities, some of which appear nodular are unchanged from prior exam. Infection versus neoplasm. There is left infrahilar adenopathy. Electronically Signed   By: Jeb Levering M.D.   On: 05/08/2015 02:46   Mr Jeri Cos ZO Contrast  04/28/2015  CLINICAL DATA:  Malignant effusion. Cytology positive for non-small cell carcinoma. EXAM: MRI HEAD WITHOUT AND WITH CONTRAST TECHNIQUE: Multiplanar, multiecho pulse sequences of the brain and surrounding structures were obtained without  and with intravenous contrast. CONTRAST:  14 mL MultiHance COMPARISON:  None. FINDINGS: No acute infarct, hemorrhage, or mass lesion is present. The ventricles are of normal size. No significant extraaxial fluid collection is present. The postcontrast images demonstrate no pathologic enhancement to suggest metastatic disease the brain or meninges. No significant white matter disease is present. The internal auditory canals are within normal limits. The brainstem is within normal limits. Flow is present in the major intracranial arteries. The globes and orbits are within normal limits. The paranasal sinuses are clear. There is some fluid in the left mastoid air cells. No obstructing nasopharyngeal lesion is present. The skullbase is within normal limits. Midline structures demonstrate degenerative change in the upper cervical spine. Intracranial midline structures are within normal limits. IMPRESSION: 1. Normal MRI appearance of the brain. No evidence for metastatic disease to the brain or meninges. 2. Degenerative changes in the upper cervical spine without definite osseous metastases. Electronically Signed   By: San Morelle M.D.   On: 04/28/2015 12:10   Nm Bone Scan Whole Body  05/05/2015  CLINICAL DATA:  Malignant cells on recent right thoracentesis. Initial encounter. EXAM: NUCLEAR MEDICINE WHOLE BODY BONE SCAN TECHNIQUE: Whole body anterior and posterior images were obtained approximately 3 hours after intravenous injection of radiopharmaceutical. RADIOPHARMACEUTICALS:  27.01 mCi Technetium-2mMDP IV COMPARISON:  Chest CT 05/03/2015.  Abdominal pelvic CT 04/27/2015. FINDINGS: There is no osseous activity favoring metastatic disease. There is a mild thoracolumbar scoliosis. There is mild degenerative activity at the shoulders, knees and great toes bilaterally. The soft tissue activity appears unremarkable. IMPRESSION: No evidence of osseous metastatic disease. Electronically Signed   By: WCaryl ComesD.  On: 05/05/2015 14:29   Ct Abdomen Pelvis W Contrast  04/27/2015  CLINICAL DATA:  Malignant pleural effusions suggesting GI origin. No abdominal complaints. EXAM: CT ABDOMEN AND PELVIS WITH CONTRAST TECHNIQUE: Multidetector CT imaging of the abdomen and pelvis was performed using the standard protocol following bolus administration of intravenous contrast. CONTRAST:  194m OMNIPAQUE IOHEXOL 300 MG/ML  SOLN COMPARISON:  Ultrasound abdomen 04/23/2015 FINDINGS: Moderate size right pleural effusion with probable loculation. Mild nodular pleural thickening. This may represent malignant effusion. Atelectasis or consolidation in the right lung base. Irregular nodule in the left lung base anteriorly measuring 11 mm diameter. Additional ground-glass nodules demonstrated in the left lung base. Metastasis is suspected. Emphysematous changes in the lungs. Scattered calcified granulomas in the liver. No focal liver lesions otherwise demonstrated. Gallbladder is contracted, likely physiologic. The pancreas, spleen, kidneys, and inferior vena cava are unremarkable. Calcification of abdominal aorta without aneurysm. No definite retroperitoneal lymphadenopathy. Decreased abdominal fat limits evaluation of the retroperitoneum. Stomach and small bowel are decompressed. Contrast material is present in the colon suggesting no evidence of bowel obstruction. Diffusely stool-filled colon. No free air or free fluid demonstrated in the abdomen. Pelvis: Calcification in the prostate gland. Mild thickening of the bladder wall may indicate cystitis or hypertrophy due to outlet obstruction. No free or loculated pelvic fluid collections. Appendix not identified. Rectosigmoid colon is mostly decompressed. There is a bone sclerosis at multiple endplates likely representing discogenic change. No definite destructive bone lesions. Degenerative changes in the lumbar spine. IMPRESSION: Moderate right pleural effusion with loculation  and pleural nodularity suggesting malignant effusion. Atelectasis in the right lung base. Ground-glass nodules in the left lung base, largest 1.1 cm. This is likely metastatic although inflammatory nodularity not entirely excluded. No definite evidence of malignancy demonstrated in the abdomen or pelvis although technical factors limits evaluation of bowel and colon. Bladder wall thickening may be due to cystitis or outlet obstruction. Electronically Signed   By: WLucienne CapersM.D.   On: 04/27/2015 23:05   Ir Guided DNiel HummerW Catheter Placement  05/12/2015  CLINICAL DATA:  Malignant pleural effusion, shortness of breath EXAM: INSERTION OF TUNNELED PLEURAL DRAINAGE CATHETER ANESTHESIA/SEDATION: Intravenous Fentanyl and Versed were administered as conscious sedation during continuous cardiorespiratory monitoring by the radiology RN, with a total moderate sedation time of 10 minutes. MEDICATIONS: As antibiotic prophylaxis, cefazolin 1 g was ordered pre-procedure and administered intravenously within one hour of incision. FLUOROSCOPY TIME:  42 seconds, 36 mGy. PROCEDURE: The procedure, risks, benefits, and alternatives were explained to the patient. Questions regarding the procedure were encouraged and answered. The patient understands and consents to the procedure. The right chest wall was prepped with Betadine in a sterile fashion, and a sterile drape was applied covering the operative field. A sterile gown and sterile gloves were used for the procedure. Local anesthesia was provided with 1% Lidocaine. Ultrasound image documentation was performed. Fluoroscopy during the procedure and fluoroscopic spot radiograph confirms appropriate catheter position. After creating a small skin incision, a 19 gauge needle was advanced into the pleural cavity under ultrasound guidance. A guide wire was then advanced under fluoroscopy into the pleural space. Pleural access was dilated serially and a 16-French peel-away sheath  placed. A 16 French tunneled PleurX catheter was placed. This was tunneled from an incision 5 cm below the pleural access to the access site. The catheter was advanced through the peel-away sheath. The sheath was then removed. Final catheter positioning was confirmed with a fluoroscopic spot image. The access incision  was closed with Dermabond applied to the incision. A Prolene retention suture was applied at the catheter exit site. Large volume thoracentesis was performed through the new catheter utilizing gravity drainage bag. COMPLICATIONS: None. FINDINGS: The catheter was placed via the right lateral chest wall. Catheter course is towards the apex. Approximately 1.0 liters of blood-tinged pleural fluid was able to be removed after catheter placement. The patient will be brought back in 10 to 14days to remove the temporary exit site retention suture after appropriate in-growth around the subcutaneous cuff of the catheter. IMPRESSION: Placement of permanent, tunneled right pleural drainage catheter via lateral approach. 1.0 liters of pleural fluid was removed today after catheter placement. Electronically Signed   By: Lucrezia Europe M.D.   On: 05/12/2015 14:30   Ir US Guide Bx Asp/drain  05/12/2015  CLINICAL DATA:  Malignant pleural effusion, shortness of breath EXAM: INSERTION OF TUNNELED PLEURAL DRAINAGE CATHETER ANESTHESIA/SEDATION: Intravenous Fentanyl and Versed were administered as conscious sedation during continuous cardiorespiratory monitoring by the radiology RN, with a total moderate sedation time of 10 minutes. MEDICATIONS: As antibiotic prophylaxis, cefazolin 1 g was ordered pre-procedure and administered intravenously within one hour of incision. FLUOROSCOPY TIME:  42 seconds, 36 mGy. PROCEDURE: The procedure, risks, benefits, and alternatives were explained to the patient. Questions regarding the procedure were encouraged and answered. The patient understands and consents to the procedure. The right  chest wall was prepped with Betadine in a sterile fashion, and a sterile drape was applied covering the operative field. A sterile gown and sterile gloves were used for the procedure. Local anesthesia was provided with 1% Lidocaine. Ultrasound image documentation was performed. Fluoroscopy during the procedure and fluoroscopic spot radiograph confirms appropriate catheter position. After creating a small skin incision, a 19 gauge needle was advanced into the pleural cavity under ultrasound guidance. A guide wire was then advanced under fluoroscopy into the pleural space. Pleural access was dilated serially and a 16-French peel-away sheath placed. A 16 French tunneled PleurX catheter was placed. This was tunneled from an incision 5 cm below the pleural access to the access site. The catheter was advanced through the peel-away sheath. The sheath was then removed. Final catheter positioning was confirmed with a fluoroscopic spot image. The access incision was closed with Dermabond applied to the incision. A Prolene retention suture was applied at the catheter exit site. Large volume thoracentesis was performed through the new catheter utilizing gravity drainage bag. COMPLICATIONS: None. FINDINGS: The catheter was placed via the right lateral chest wall. Catheter course is towards the apex. Approximately 1.0 liters of blood-tinged pleural fluid was able to be removed after catheter placement. The patient will be brought back in 10 to 14days to remove the temporary exit site retention suture after appropriate in-growth around the subcutaneous cuff of the catheter. IMPRESSION: Placement of permanent, tunneled right pleural drainage catheter via lateral approach. 1.0 liters of pleural fluid was removed today after catheter placement. Electronically Signed   By: Lucrezia Europe M.D.   On: 05/12/2015 14:30   Dg Chest Port 1 View  05/11/2015  CLINICAL DATA:  Right-sided Port-A-Cath insertion. EXAM: PORTABLE CHEST 1 VIEW  COMPARISON:  05/09/2015 FINDINGS: New right anterior chest wall Port-A-Cath has its catheter tip projecting in the lower superior vena cava. There is no pneumothorax. Right hemi thorax is now almost completely opacified. Patchy opacity persists in the left mid and lower lung zone, stable. No left pleural effusion. IMPRESSION: 1. Right Port-A-Cath is well positioned with its tip in  the lower superior vena cava. 2. There has been further worsening of lung aeration on the right now with the right hemi thorax walls completely opacified. 3. No pneumothorax. Electronically Signed   By: Lajean Manes M.D.   On: 05/11/2015 12:56   Dg Chest Port 1 View  04/27/2015  CLINICAL DATA:  Status post bronchoscopy with biopsy EXAM: PORTABLE CHEST 1 VIEW COMPARISON:  April 25, 2015. FINDINGS: The heart size and mediastinal contours are within normal limits. No pneumothorax is noted. Mild right pleural effusion is noted. 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 Stable diffuse opacification of right upper lobe is noted. The visualized skeletal structures are unremarkable. IMPRESSION: Stable right upper lobe opacity is noted concerning for pneumonia. Mild right pleural effusion is noted. Electronically Signed   By: Marijo Conception, M.D.   On: 04/27/2015 10:36   Dg C-arm 1-60 Min-no Report  05/11/2015  CLINICAL DATA: Portacath placement C-ARM 1-60 MINUTES Fluoroscopy was utilized by the requesting physician.  No radiographic interpretation.   US Thoracentesis Asp Pleural Space W/img Guide  05/09/2015  CLINICAL DATA:  Shortness of breath, recurrent RIGHT pleural effusion EXAM: ULTRASOUND GUIDED 05/04/2015 THORACENTESIS COMPARISON:  None. PROCEDURE: Procedure, benefits, and risks of procedure were discussed with patient. Written informed consent for procedure was obtained. Time out protocol followed. Pleural effusion localized by ultrasound at the posterior RIGHT  hemithorax. Skin prepped and draped in usual sterile fashion. Skin and soft tissues anesthetized with 8 mL of 1% lidocaine. 8 French thoracentesis catheter placed into the RIGHT pleural space. 1500 mL of dark old appearing bloody fluid aspirated by syringe pump. Procedure tolerated well by patient without immediate complication. COMPLICATIONS: None FINDINGS: As above IMPRESSION: Successful ultrasound guided RIGHT thoracentesis yielding 1500 mL of dark old appearing bloody pleural fluid. Electronically Signed   By: Lavonia Dana M.D.   On: 05/09/2015 11:17   US Thoracentesis Asp Pleural Space W/img Guide  05/04/2015  CLINICAL DATA:  Right pleural effusion EXAM: ULTRASOUND GUIDED RIGHT THORACENTESIS COMPARISON:  Chest CT 05/03/2015 PROCEDURE: An ultrasound guided thoracentesis was thoroughly discussed with the patient and questions answered. The benefits, risks, alternatives and complications were also discussed. The patient understands and wishes to proceed with the procedure. Written consent was obtained. Ultrasound was performed to localize and mark an adequate pocket of fluid in the right posterior chest. The area was then prepped and draped in the normal sterile fashion. 1% Lidocaine was used for local anesthesia. Under ultrasound guidance a 19 gauge Yueh catheter was introduced. Thoracentesis was performed. The catheter was removed and a dressing applied. COMPLICATIONS: None. FINDINGS: A total of approximately 1.4 L of bloody fluid was removed. A fluid sample wassent for laboratory analysis. IMPRESSION: Successful ultrasound guided right thoracentesis yielding 1.4 L of pleural fluid. Electronically Signed   By: Rolm Baptise M.D.   On: 05/04/2015 12:31   US Thoracentesis Asp Pleural Space W/img Guide  04/23/2015  INDICATION: Pneumonia, smoker, cough, dyspnea, chest pain, right pleural effusion. Request is made for diagnostic and therapeutic right thoracentesis. EXAM: ULTRASOUND GUIDED DIAGNOSTIC AND  THERAPEUTIC RIGHT THORACENTESIS COMPARISON:  None. MEDICATIONS: None COMPLICATIONS: None immediate TECHNIQUE: Informed written consent was obtained from the patient after a discussion of the risks, benefits and alternatives to treatment. A timeout was performed prior to the initiation of the procedure. Initial ultrasound scanning demonstrates a moderate to large right pleural effusion. The lower chest was prepped and draped in the usual sterile  fashion. 1% lidocaine was used for local anesthesia. An ultrasound image was saved for documentation purposes. A 6 Fr Safe-T-Centesis catheter was introduced. The thoracentesis was performed. The catheter was removed and a dressing was applied. The patient tolerated the procedure well without immediate post procedural complication. The patient was escorted to have an upright chest radiograph. FINDINGS: A total of approximately 1.4 liters of turbid, amber fluid was removed. Requested samples were sent to the laboratory. IMPRESSION: Successful ultrasound-guided diagnostic and therapeutic right sided thoracentesis yielding 1.4 liters of pleural fluid. Read by: Rowe Robert, PA-C Electronically Signed   By: Aletta Edouard M.D.   On: 04/23/2015 11:06   Dg C-arm Bronchoscopy  04/27/2015  CLINICAL DATA:  C-ARM BRONCHOSCOPY Fluoroscopy was utilized by the requesting physician.  No radiographic interpretation.    Lab Results: Basic Metabolic Panel:  Recent Labs  05/13/15 0618  NA 134*  K 3.8  CL 97*  CO2 30  GLUCOSE 116*  BUN 7  CREATININE 0.72  CALCIUM 8.6*   Liver Function Tests: No results for input(s): AST, ALT, ALKPHOS, BILITOT, PROT, ALBUMIN in the last 72 hours.   CBC:  Recent Labs  05/12/15 0531  WBC 11.0*  HGB 8.4*  HCT 25.2*  MCV 85.4  PLT 652*    Recent Results (from the past 240 hour(s))  Culture, blood (routine x 2)     Status: None   Collection Time: 05/08/15  3:45 AM  Result Value Ref Range Status   Specimen Description BLOOD  RIGHT ANTECUBITAL  Final   Special Requests BOTTLES DRAWN AEROBIC AND ANAEROBIC 6CC  Final   Culture NO GROWTH 5 DAYS  Final   Report Status 05/13/2015 FINAL  Final  Culture, blood (routine x 2)     Status: None   Collection Time: 05/08/15  3:55 AM  Result Value Ref Range Status   Specimen Description BLOOD RIGHT ARM  Final   Special Requests BOTTLES DRAWN AEROBIC AND ANAEROBIC 6CC  Final   Culture NO GROWTH 5 DAYS  Final   Report Status 05/13/2015 FINAL  Final  Surgical pcr screen     Status: None   Collection Time: 05/11/15  1:05 AM  Result Value Ref Range Status   MRSA, PCR NEGATIVE NEGATIVE Final   Staphylococcus aureus NEGATIVE NEGATIVE Final    Comment:        The Xpert SA Assay (FDA approved for NASAL specimens in patients over 77 years of age), is one component of a comprehensive surveillance program.  Test performance has been validated by [facility] for patients greater than or equal to 73 year old. It is not intended to diagnose infection nor to guide or monitor treatment.      Hospital Course: This is a 54 year old who has been in the hospital with pleural effusions that seem to be related to lung cancer. He was discharged but had rapid reaccumulation of his fluid and return to the hospital with shortness of breath. He had thoracentesis with good results but started reaccumulating fluid again and eventually underwent Pleurx catheter placement by interventional radiology. He also had Port-A-Cath placement during this admission. He has improved he feels much better and he is ready for discharge. He is going to have outpatient chemotherapy.  Discharge Exam: Blood pressure 109/71, pulse 88, temperature 98.2 F (36.8 C), temperature source Oral, resp. rate 18, height '5\' 8"'$  (1.727 m), weight 59.421 kg (131 lb), SpO2 95 %. He is awake and alert. He looks comfortable. His chest is clear.  His catheter is in good position  Disposition: Home with home health services he will  have a nebulizer. He may need home oxygen and that is being determined now. He does not currently qualify for home oxygen. He will follow-up in the oncology clinic      Discharge Instructions    DME Nebulizer machine    Complete by:  As directed      Discharge patient    Complete by:  As directed      Face-to-face encounter (required for Medicare/Medicaid patients)    Complete by:  As directed   I Tyton Abdallah L certify that this patient is under my care and that I, or a nurse practitioner or physician's assistant working with me, had a face-to-face encounter that meets the physician face-to-face encounter requirements with this patient on 05/14/2015. The encounter with the patient was in whole, or in part for the following medical condition(s) which is the primary reason for home health care (List medical condition): lung cancer  The encounter with the patient was in whole, or in part, for the following medical condition, which is the primary reason for home health care:  lung cancer  I certify that, based on my findings, the following services are medically necessary home health services:  Nursing  Reason for Medically Necessary Home Health Services:  Skilled Nursing- Change/Decline in Patient Status  My clinical findings support the need for the above services:  Shortness of breath with activity  Further, I certify that my clinical findings support that this patient is homebound due to:  Shortness of Breath with activity     Home Health    Complete by:  As directed   To provide the following care/treatments:  RN  Drain  His pleurex daily as needed           Follow-up Information    Follow up with [facility].   Contact information:   [place] [address] [phone]       Signed: Alonza Bogus   05/14/2015, 10:54 AM

## 2015-05-14 NOTE — Progress Notes (Signed)
He feels and looks better. His chest is clear. He is in no distress. He is ready for discharge.

## 2015-05-14 NOTE — Progress Notes (Signed)
Patient with orders to be discharged home with home health. Discharge instructions given, patient verbalized understanding. Prescriptions given. Pleural Vac supplies given. [facility] notified of patient's discharge. Patient stable. Patient left in private vehicle with family.

## 2015-05-16 ENCOUNTER — Encounter (HOSPITAL_COMMUNITY): Payer: Self-pay

## 2015-05-19 ENCOUNTER — Encounter (HOSPITAL_COMMUNITY): Payer: Medicaid Other | Attending: Hematology & Oncology | Admitting: Hematology & Oncology

## 2015-05-19 ENCOUNTER — Telehealth (HOSPITAL_COMMUNITY): Payer: Self-pay

## 2015-05-19 ENCOUNTER — Encounter (HOSPITAL_COMMUNITY): Payer: Self-pay | Admitting: Hematology & Oncology

## 2015-05-19 VITALS — BP 166/66 | HR 85 | Temp 98.3°F | Resp 16 | Ht 67.5 in | Wt 129.4 lb

## 2015-05-19 DIAGNOSIS — C801 Malignant (primary) neoplasm, unspecified: Secondary | ICD-10-CM | POA: Diagnosis not present

## 2015-05-19 DIAGNOSIS — D630 Anemia in neoplastic disease: Secondary | ICD-10-CM

## 2015-05-19 DIAGNOSIS — D649 Anemia, unspecified: Secondary | ICD-10-CM | POA: Insufficient documentation

## 2015-05-19 DIAGNOSIS — Z720 Tobacco use: Secondary | ICD-10-CM | POA: Diagnosis not present

## 2015-05-19 DIAGNOSIS — C3491 Malignant neoplasm of unspecified part of right bronchus or lung: Secondary | ICD-10-CM | POA: Diagnosis present

## 2015-05-19 DIAGNOSIS — E43 Unspecified severe protein-calorie malnutrition: Secondary | ICD-10-CM

## 2015-05-19 LAB — COMPREHENSIVE METABOLIC PANEL
ALBUMIN: 2.5 g/dL — AB (ref 3.5–5.0)
ALK PHOS: 100 U/L (ref 38–126)
ALT: 28 U/L (ref 17–63)
ANION GAP: 9 (ref 5–15)
AST: 25 U/L (ref 15–41)
BUN: 11 mg/dL (ref 6–20)
CALCIUM: 9.2 mg/dL (ref 8.9–10.3)
CO2: 27 mmol/L (ref 22–32)
Chloride: 99 mmol/L — ABNORMAL LOW (ref 101–111)
Creatinine, Ser: 1.01 mg/dL (ref 0.61–1.24)
GFR calc Af Amer: >60 mL/min (ref >60)
GFR calc non Af Amer: >60 mL/min (ref >60)
GLUCOSE: 109 mg/dL — AB (ref 65–99)
Potassium: 4.1 mmol/L (ref 3.5–5.1)
SODIUM: 135 mmol/L (ref 135–145)
Total Bilirubin: 0.4 mg/dL (ref 0.3–1.2)
Total Protein: 6.8 g/dL (ref 6.5–8.1)

## 2015-05-19 LAB — CBC WITH DIFFERENTIAL/PLATELET
BASOS PCT: 0 %
Basophils Absolute: 0 10*3/uL (ref 0.0–0.1)
EOS ABS: 0 10*3/uL (ref 0.0–0.7)
Eosinophils Relative: 0 %
HCT: 27.2 % — ABNORMAL LOW (ref 39.0–52.0)
HEMOGLOBIN: 8.9 g/dL — AB (ref 13.0–17.0)
Lymphocytes Relative: 6 %
Lymphs Abs: 0.9 10*3/uL (ref 0.7–4.0)
MCH: 27.7 pg (ref 26.0–34.0)
MCHC: 32.7 g/dL (ref 30.0–36.0)
MCV: 84.7 fL (ref 78.0–100.0)
Monocytes Absolute: 1.1 10*3/uL — ABNORMAL HIGH (ref 0.1–1.0)
Monocytes Relative: 8 %
NEUTROS PCT: 86 %
Neutro Abs: 12.3 10*3/uL — ABNORMAL HIGH (ref 1.7–7.7)
Platelets: 1177 10*3/uL (ref 150–400)
RBC: 3.21 MIL/uL — AB (ref 4.22–5.81)
RDW: 14.6 % (ref 11.5–15.5)
WBC: 14.3 10*3/uL — AB (ref 4.0–10.5)

## 2015-05-19 MED ORDER — FLUCONAZOLE 100 MG PO TABS: 100.0000 mg | ORAL_TABLET | Freq: Every day | ORAL | Status: DC

## 2015-05-19 MED ORDER — HYDROCODONE-ACETAMINOPHEN 5-325 MG PO TABS: 1.0000 | ORAL_TABLET | ORAL | Status: DC | PRN

## 2015-05-19 MED ORDER — FLUCONAZOLE 100 MG PO TABS: 100.0000 mg | ORAL_TABLET | Freq: Every day | ORAL | Status: AC

## 2015-05-19 NOTE — Patient Instructions (Signed)
[place] at [facility] Discharge Instructions  RECOMMENDATIONS MADE BY THE CONSULTANT AND ANY TEST RESULTS WILL BE SENT TO YOUR REFERRING PHYSICIAN.  Exam and discussion by [name]. Will check some labs and also send sample to [facility] to help determine what type cancer you have Our materials department will order some drainage catheter kits and they should be here tomorrow.  Will call you when they arrive.  As long as the catheter is draining about 100 cc of fluid it's ok if it stops draining you need to notify your doctor. Diflucan take 1 tablet daily Vicodin 5-'325mg'$  take 1 every 4 hours as needed for pain. Call with any concerns or issues. Follow-up in 1 week.  Thank you for choosing [place] at [facility] to provide your oncology and hematology care.  To afford each patient quality time with our provider, please arrive at least 15 minutes before your scheduled appointment time.    You need to re-schedule your appointment should you arrive 10 or more minutes late.  We strive to give you quality time with our providers, and arriving late affects you and other patients whose appointments are after yours.  Also, if you no show three or more times for appointments you may be dismissed from the clinic at the providers discretion.     Again, thank you for choosing [facility].  Our hope is that these requests will decrease the amount of time that you wait before being seen by our physicians.       _____________________________________________________________  Should you have questions after your visit to [facility], please contact our office at [phone] between the hours of 8:30 a.m. and 4:30 p.m.  Voicemails left after 4:30 p.m. will not be returned until the following business day.  For prescription refill requests, have your pharmacy contact our office.

## 2015-05-19 NOTE — Telephone Encounter (Signed)
CRITICAL VALUE ALERT Critical value received:  Platelet count of 1,177,000 Date of notification:  05/19/15  Time of notification: 2836  Critical value read back:  Yes.   Nurse who received alert:  Mickie Kay, RN  MD notified (1st page):  [name] @ [phone]

## 2015-05-19 NOTE — Progress Notes (Signed)
..  [name]'s reason for visit today are for labs as scheduled per MD orders.  Venipuncture performed with a 23 gauge butterfly needle to R Antecubital.  Brian Nelson tolerated venipuncture well and without incident; questions were answered and patient was discharged.

## 2015-05-20 LAB — PATHOLOGIST SMEAR REVIEW

## 2015-05-24 ENCOUNTER — Encounter (HOSPITAL_COMMUNITY): Payer: Self-pay

## 2015-05-24 LAB — FUNGUS CULTURE W SMEAR
FUNGAL SMEAR: NONE SEEN
SPECIAL REQUESTS: NORMAL

## 2015-05-26 ENCOUNTER — Encounter (HOSPITAL_COMMUNITY): Payer: Self-pay | Admitting: Oncology

## 2015-05-26 ENCOUNTER — Other Ambulatory Visit (HOSPITAL_COMMUNITY): Payer: Self-pay | Admitting: *Deleted

## 2015-05-26 ENCOUNTER — Encounter (HOSPITAL_BASED_OUTPATIENT_CLINIC_OR_DEPARTMENT_OTHER): Payer: Medicaid Other | Admitting: Oncology

## 2015-05-26 VITALS — BP 112/71 | HR 94 | Temp 97.4°F | Resp 22 | Wt 123.4 lb

## 2015-05-26 DIAGNOSIS — C801 Malignant (primary) neoplasm, unspecified: Secondary | ICD-10-CM

## 2015-05-26 MED ORDER — ALBUTEROL SULFATE HFA 108 (90 BASE) MCG/ACT IN AERS: 2.0000 | INHALATION_SPRAY | Freq: Four times a day (QID) | RESPIRATORY_TRACT | Status: AC | PRN

## 2015-05-26 MED ORDER — OXYCODONE-ACETAMINOPHEN 5-325 MG PO TABS: 1.0000 | ORAL_TABLET | ORAL | Status: DC | PRN

## 2015-05-26 NOTE — Assessment & Plan Note (Addendum)
Stage IV adenocarcinoma of unknown primary, CancerType ID on 05/13/2015 demonstrates an 89% probability of Gastroesophageal adenocarcinoma, but pancreaticobiliary (7% probability) and intestine primary (<5% probability) cannot be ruled out.  Diagnosis on problem list is updated.  Oncology history developed.   I personally reviewed and went over pathology results with the patient.  He is provided an Rx for [employer].  I have given an RX for Oxycodone and Ventolin.  I wrote an order for O2.  His O2 saturation at rest was 91% on RA, it dropped to 87% with walking only 4f on RA, and recovered to 94% with O2 at 2 LPM.  [facility]  I discussed chemotherapy.  We will treat with Carbo/Taxol every 21 days.  I deferred discussion regarding prognosis and stage.  This will be addressed in the near future.    He will need chemotherapy teaching.  HLupita Raideris notified.  Nurse will get him a urinal for at home.  NBurtis Junesis notified by nursing regarding ensure.  CSW will be notified about transportation issues.  Return for chemotherapy teaching followed by chemotherapy next week.    Treatment plan built.  Pre-chemo labs: CBC diff, CMET, CEA, Mg,  Return on day 1 cycle 1 and/or 7-10 days after Day 1 of cycle 1 for Nadir check with labs.

## 2015-05-26 NOTE — Progress Notes (Signed)
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 male returns for followup of adenocarcinoma of unknown primary, Stage IV, with cancerType ID providing a 89% probability of gastroesophageal adenocarcinoma.    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 Extensive empyema at the right lung base. Compressive atelectasis of the right lung. Background pattern of centrilobular emphysema with a 12 cm bulla at the left lung apex. Areas of abnormal density in    04/23/2015 Procedure R thoracentesis   04/23/2015 Pathology Results MALIGNANT CELLS PRESENT, CONSISTENT WITH NON SMALL CELL CARCINOMA.  THE POSITIVE STAINING WITH CDX-2 RAISES THE POSSIBILITY OF A GASTROINTESTINAL PRIMARY   04/27/2015 Imaging CT abd/pelvis- Moderate right pleural effusion with loculation and pleural nodularity suggesting malignant effusion. Atelectasis in the right lung base. Ground-glass nodules in the left lung base, largest 1.1 cm. This is likely metastatic although infl   04/27/2015 Procedure Bronchoscopy by Dr. Lake Bells (Pulmonology)   04/27/2015 Pathology Results BRONCHIAL BRUSHING, RIGHT UPPER LOBE (SPECIMEN 2 OF 2 COLLECTED 04-27-2015) SUSPICIOUS FOR MALIGNANCY.   04/27/2015 Pathology Results BRONCHIAL LAVAGE RIGHT UPPER LOBE (SPECIMEN 1 OF 2 COLLECTED 04/27/2015) MALIGNANT CELLS PRESENT,   04/28/2015 Imaging MRI brain- Normal MRI appearance of the brain. No evidence for metastatic disease to the brain or meninges. Degenerative changes in the upper cervical spine without definite osseous metastases.   05/03/2015 Imaging CT chest- Persistent large at least partially loculated RIGHT pleural effusion with scattered areas of pleural thickening and enhancement consistent with malignant pleural effusion as identified by prior thoracentesis and cytology. Complete atelect   05/04/2015 Procedure R thoracentesis   05/04/2015 Pathology Results PLEURAL FLUID, RIGHT (SPECIMEN 1 OF 1 COLLECTED 05/03/18): MALIGNANT CELLS PRESENT   05/05/2015 Imaging Bone scan- No evidence of osseous metastatic disease.   05/06/2015 Procedure EGD/Colonoscopy- Negative for findings suggestive of malignancy.  Performed by Dr. Oneida Alar.   05/08/2015 Imaging CT angio chest- No pulmonary embolus. 2. Large partially loculated right pleural effusion with near complete atelectasis and consolidation of the right lung. The pleural effusion has increased in size compared to exam 5 days prior. 3. Advanced emphys   05/11/2015 Procedure Port-A-Cath insertion, by Dr. Arnoldo Morale   05/12/2015 Procedure Right PleurX placed by IR   05/13/2015 Pathology Results CANCERTYPE ID- 00% probability of gastroesophageal adenocarcinoma, cannot exclude pancreaticobiliary (7%) and intestine.     05/13/2015 Pathology Results Cancer types rule out w 95% confidence: Adrenal, brain, GIST, Germ cell, H&N, kidney, HCC, Lung, lymphoma, melanoma, meningioma, mesothelioma, neuroendocrine, prostate, sarcoma, sex cord stromal,squamous cell, basal cell, thymus, thyroid, urinary bladder   05/13/2015 Pathology Results HER2 negative by FISH, equivocal by [facility]   05/17/2015 Pathology Results [facility]- not enough tissue for testing.    I personally reviewed and went over pathology results with the patient.  I reviewed the treatment for this.  After long consideration,  [name] has decided to pursue Carbo/Taxol treatment in an attempt to cover the most likely possibilities of primary tumor.  I did not review prognosis or staging with the patient at this  time as he had a host of symptoms that needed addressing.  Past Medical History  Diagnosis Date  . Tobacco use disorder 04/23/2015    Quit Fall 2016  . ETOH abuse     Quit Fall 2016  . Illicit drug use     Quit Fall 2016  . Lung cancer ([place])   . 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     has No Known Allergies.  Current Outpatient Prescriptions on File Prior to Visit  Medication Sig Dispense Refill  . fluconazole (DIFLUCAN) 100 MG tablet Take 1 tablet (100 mg total) by mouth daily. 14 tablet 0  . Fluticasone-Salmeterol (ADVAIR DISKUS) 250-50 MCG/DOSE AEPB Inhale 1 puff into the lungs 2 (two) times daily. 60 each 12  . ondansetron (ZOFRAN) 4 MG tablet Take 1 tablet (4 mg total) by mouth every 8 (eight) hours as needed for nausea or vomiting. 20 tablet 2   No current facility-administered medications on file prior to visit.    Past Surgical History  Procedure Laterality Date  . Video bronchoscopy Bilateral 04/27/2015    Procedure: VIDEO BRONCHOSCOPY WITH FLUORO;  Surgeon: Juanito Doom, MD;  Location: [facility];  Service: Cardiopulmonary;  Laterality: Bilateral;  . Hernia repair Right 2009    [place]  . Colonoscopy with propofol N/A 05/06/2015    Procedure: COLONOSCOPY WITH PROPOFOL; IN CECUM AT 1159; WITHDRAWAL TIME 23 MINUTES;  Surgeon: Danie Binder, MD;  Location: [facility];  Service: Endoscopy;  Laterality: N/A;  . Esophagogastroduodenoscopy (egd) with propofol N/A 05/06/2015    Procedure: ESOPHAGOGASTRODUODENOSCOPY (EGD) WITH PROPOFOL;  Surgeon: Danie Binder, MD;  Location: [facility];  Service: Endoscopy;  Laterality: N/A;  . Portacath placement Right 05/11/2015    Procedure: INSERTION PORT-A-CATH;  Surgeon: Aviva Signs, MD;  Location: [facility];  Service: General;  Laterality: Right;  . Plurex catheter Right     Denies any headaches, dizziness, double vision, fevers, chills, night sweats, nausea, vomiting, diarrhea, constipation, chest pain, heart palpitations,  blood in stool, black tarry stool, urinary pain, urinary burning, urinary frequency, hematuria.   PHYSICAL EXAMINATION  ECOG PERFORMANCE STATUS: 2 - Symptomatic, <50% confined to bed  Filed Vitals:   05/26/15 1103  BP: 112/71  Pulse: 94  Temp: 97.4 F (36.3 C)  Resp: 22    GENERAL:alert, no distress, cachectic, cooperative, smiling and accompanied by ex-girlfriend, mother, and two cousins SKIN: skin color, texture, turgor are normal, no rashes or significant lesions HEAD: Normocephalic, No masses, lesions, tenderness or abnormalities EYES: normal, PERRLA, EOMI, Conjunctiva are pink and non-injected EARS: External ears normal OROPHARYNX:lips, buccal mucosa, and tongue normal and mucous membranes are moist  NECK: supple, trachea midline LYMPH:  not examined BREAST:not examined LUNGS: decreased breath sounds throughout, no breath sounds in right base. HEART: regular rate & rhythm ABDOMEN:abdomen soft and normal bowel sounds BACK: Back symmetric, no curvature. EXTREMITIES:less then 2 second capillary refill, no joint deformities, effusion, or inflammation, no skin discoloration, no cyanosis  NEURO: alert &  oriented x  3 with fluent speech, no focal motor/sensory deficits, gait normal   LABORATORY DATA: CBC    Component Value Date/Time   WBC 14.3* 05/19/2015 1530   RBC 3.21* 05/19/2015 1530   RBC 3.07* 05/10/2015 1119   HGB 8.9* 05/19/2015 1530   HCT 27.2* 05/19/2015 1530   PLT 1177* 05/19/2015 1530   MCV 84.7 05/19/2015 1530   MCH 27.7 05/19/2015 1530   MCHC 32.7 05/19/2015 1530   RDW 14.6 05/19/2015 1530   LYMPHSABS 0.9 05/19/2015 1530   MONOABS 1.1* 05/19/2015 1530   EOSABS 0.0 05/19/2015 1530   BASOSABS 0.0 05/19/2015 1530      Chemistry      Component Value Date/Time   NA 135 05/19/2015 1530   K 4.1 05/19/2015 1530   CL 99* 05/19/2015 1530   CO2 27 05/19/2015 1530   BUN 11 05/19/2015 1530   CREATININE 1.01 05/19/2015 1530      Component Value Date/Time   CALCIUM 9.2 05/19/2015 1530   ALKPHOS 100 05/19/2015 1530   AST 25 05/19/2015 1530   ALT 28 05/19/2015 1530   BILITOT 0.4 05/19/2015 1530        PENDING LABS:   RADIOGRAPHIC STUDIES:  Dg Chest 1 View  05/09/2015  CLINICAL DATA:  Post RIGHT thoracentesis EXAM: CHEST 1 VIEW COMPARISON:  Expiratory chest radiograph compared to 05/04/2015 and correlated with CT chest of 05/08/2015 FINDINGS: Stable heart size. Persistent loculated RIGHT pleural effusion identified with scattered areas of atelectasis and consolidation within RIGHT lung. Underlying emphysematous changes with persistent infiltrates in the mid to lower LEFT lung. No pneumothorax identified. No acute osseous findings. IMPRESSION: No pneumothorax following RIGHT thoracentesis. Persistent loculated RIGHT pleural effusion with areas of atelectasis and consolidation of RIGHT lung. Persistent infiltrate LEFT lower lobe. Findings discussed with patient. Patient asymptomatic following procedure. Electronically Signed   By: Lavonia Dana M.D.   On: 05/09/2015 10:41   Dg Chest 1 View  05/04/2015  CLINICAL DATA:  Post right thoracentesis EXAM: CHEST 1 VIEW  COMPARISON:  05/03/2015 FINDINGS: Decreasing right effusion. Moderate partially loculated right effusion persists with diffuse right lung airspace disease. No pneumothorax. Underlying COPD. Linear scarring or atelectasis in the left upper lobe. Heart is normal size. IMPRESSION: Decreasing right effusion with moderate partially loculated right effusion persisting. No pneumothorax. Diffuse right lung airspace disease. COPD. Electronically Signed   By: Rolm Baptise M.D.   On: 05/04/2015 12:30   Dg Chest 2 View  05/03/2015  CLINICAL DATA:  Shortness of breath, mild chest pain, had fluid drawn from RIGHT hemi thorax last week EXAM: CHEST  2 VIEW COMPARISON:  04/27/2015 FINDINGS: Subtotal opacification of the RIGHT hemi thorax by a combination of fluid and increased lung atelectasis versus consolidation. Slight mediastinal shift to the LEFT. Stable heart size and pulmonary vascularity. Underlying emphysematous changes with linear subsegmental atelectasis versus scarring in LEFT upper lobe. Questionable mild LEFT perihilar infiltrate. No LEFT pleural effusion or pneumothorax. IMPRESSION: Increased opacification of the RIGHT hemi thorax by combination of pleural effusion and increased RIGHT lung atelectasis versus consolidation. COPD changes with atelectasis versus scarring in LEFT upper lobe and question mild LEFT perihilar infiltrate. Electronically Signed   By: Lavonia Dana M.D.   On: 05/03/2015 17:56   Ct Chest W Contrast  05/03/2015  CLINICAL DATA:  Chest pain and shortness of breath for couple weeks, recent CT chest with RIGHT pleural effusion, underwent thoracentesis, re-accumulation of fluid in increased infiltrate, history COPD ; cytology positive for malignant effusion though  uncertain as to whether pulmonary or GI source EXAM: CT CHEST WITH CONTRAST TECHNIQUE: Multidetector CT imaging of the chest was performed during intravenous contrast administration. Sagittal and coronal MPR images reconstructed from  axial data set. CONTRAST:  29mL OMNIPAQUE IOHEXOL 300 MG/ML  SOLN IV COMPARISON:  04/22/2015; correlation chest radiograph 05/03/2015 FINDINGS: Mild scattered atherosclerotic calcification aorta and coronary arteries. Persistent large at least partially loculated RIGHT pleural effusion with pleural thickening and scattered enhancement consistent with malignant effusion. Multiple normal sized lymph nodes at RIGHT cardiophrenic angle. Complete atelectasis of RIGHT middle and RIGHT lower lobes. Extensive opacity within LEFT upper lobe without significant volume loss ; this may represent diffuse infiltrate though tumor is not excluded. Abnormal soft tissue at RIGHT hilum suspicious for adenopathy, node 17 mm short axis image 30. Enlarged precarinal lymph node 16 mm short axis image 29. Loculated fluid collection at RIGHT apex. Scattered upper normal sized LEFT hilar nodes. Visualized upper abdomen unremarkable. Large bulla LEFT apex 12.2 cm diameter. Again identified multiple subpleural nodular foci in LEFT lung question tumor versus infiltrate, largest 12 mm diameter. Scattered areas of minimal infiltrate in the LEFT upper and LEFT lower lobes are slightly increased since previous exam. No LEFT pleural effusion or pneumothorax. Degenerative disc disease changes lower cervical and mid thoracic spine. No definite osseous metastatic lesions. IMPRESSION: Persistent large at least partially loculated RIGHT pleural effusion with scattered areas of pleural thickening and enhancement consistent with malignant pleural effusion as identified by prior thoracentesis and cytology. Complete atelectasis of RIGHT middle and RIGHT lower lobes. Subtotal opacification of the RIGHT upper lobe may represent diffuse consolidation or tumor. Mediastinal and suspected RIGHT hilar adenopathy with upper normal sized LEFT hilar nodes. Minimal patchy infiltrate LEFT lung increased since previous exam including several subpleural nodular foci in  the LEFT upper and LEFT lower lobes, question infection versus tumor. 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 Electronically Signed   By: Lavonia Dana M.D.   On: 05/03/2015 18:59   Ct Angio Chest Pe W/cm &/or Wo Cm  05/08/2015  CLINICAL DATA:  Hypoxia. History of lung cancer and right pleural effusion. Progressive constant shortness of breath. EXAM: CT ANGIOGRAPHY CHEST WITH CONTRAST TECHNIQUE: Multidetector CT imaging of the chest was performed using the standard protocol during bolus administration of intravenous contrast. Multiplanar CT image reconstructions and MIPs were obtained to evaluate the vascular anatomy. CONTRAST:  166mL OMNIPAQUE IOHEXOL 350 MG/ML SOLN COMPARISON:  Chest CT 5 days prior 05/03/2015 FINDINGS: There are no filling defects within the pulmonary arteries to suggest pulmonary embolus. Large partially loculated right pleural effusion has increased in size from prior. This causes near complete atelectasis of the right lower lobe. There is near complete atelectasis of the right upper lobe with small aerated portion anteriorly. Complete atelectasis of the right middle lobe. Aerated lung demonstrates ground-glass and consolidation. There is minimal leftward shift in mediastinal structures related to large pleural effusion. Large left apical bulla measures at least 12 cm, unchanged. Advanced emphysema throughout the remainder of the left lung. Multifocal ground-glass opacities in the periphery of the left lung, some of which are nodular. Enlarged left infrahilar lymph nodes, with mild interval increase in size from prior. No definite left pleural effusion. No pericardial effusion. Unchanged enlarged paratracheal lymph nodes. These are partially obscured by adjacent pleural fluid. The heart size is normal. Thoracic aorta is normal in caliber with scattered atherosclerosis. Right epicardial/pericardial lymph nodes changed. Evaluation of the upper abdomen demonstrates no  significant  change from prior. Mild whole body wall and mesenteric edema. There are no acute or suspicious osseous abnormalities. 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 Review of the MIP images confirms the above findings. IMPRESSION: 1. No pulmonary embolus. 2. Large partially loculated right pleural effusion with near complete atelectasis and consolidation of the right lung. The pleural effusion has increased in size compared to exam 5 days prior. 3. Advanced emphysema with large bulla in the left upper lobe. Multifocal peripheral ground-glass opacities, some of which appear nodular are unchanged from prior exam. Infection versus neoplasm. There is left infrahilar adenopathy. Electronically Signed   By: Jeb Levering M.D.   On: 05/08/2015 02:46   Mr Jeri Cos EX Contrast  04/28/2015  CLINICAL DATA:  Malignant effusion. Cytology positive for non-small cell carcinoma. EXAM: MRI HEAD WITHOUT AND WITH CONTRAST TECHNIQUE: Multiplanar, multiecho pulse sequences of the brain and surrounding structures were obtained without and with intravenous contrast. CONTRAST:  14 mL MultiHance COMPARISON:  None. FINDINGS: No acute infarct, hemorrhage, or mass lesion is present. The ventricles are of normal size. No significant extraaxial fluid collection is present. The postcontrast images demonstrate no pathologic enhancement to suggest metastatic disease the brain or meninges. No significant white matter disease is present. The internal auditory canals are within normal limits. The brainstem is within normal limits. Flow is present in the major intracranial arteries. The globes and orbits are within normal limits. The paranasal sinuses are clear. There is some fluid in the left mastoid air cells. No obstructing nasopharyngeal lesion is present. The skullbase is within normal limits. Midline structures demonstrate degenerative change in the upper cervical spine. Intracranial midline structures  are within normal limits. IMPRESSION: 1. Normal MRI appearance of the brain. No evidence for metastatic disease to the brain or meninges. 2. Degenerative changes in the upper cervical spine without definite osseous metastases. Electronically Signed   By: San Morelle M.D.   On: 04/28/2015 12:10   Nm Bone Scan Whole Body  05/05/2015  CLINICAL DATA:  Malignant cells on recent right thoracentesis. Initial encounter. EXAM: NUCLEAR MEDICINE WHOLE BODY BONE SCAN TECHNIQUE: Whole body anterior and posterior images were obtained approximately 3 hours after intravenous injection of radiopharmaceutical. RADIOPHARMACEUTICALS:  27.01 mCi Technetium-85m MDP IV COMPARISON:  Chest CT 05/03/2015.  Abdominal pelvic CT 04/27/2015. FINDINGS: There is no osseous activity favoring metastatic disease. There is a mild thoracolumbar scoliosis. There is mild degenerative activity at the shoulders, knees and great toes bilaterally. The soft tissue activity appears unremarkable. IMPRESSION: No evidence of osseous metastatic disease. Electronically Signed   By: Richardean Sale M.D.   On: 05/05/2015 14:29   Ct Abdomen Pelvis W Contrast  04/27/2015  CLINICAL DATA:  Malignant pleural effusions suggesting GI origin. No abdominal complaints. EXAM: CT ABDOMEN AND PELVIS WITH CONTRAST TECHNIQUE: Multidetector CT imaging of the abdomen and pelvis was performed using the standard protocol following bolus administration of intravenous contrast. CONTRAST:  194mL OMNIPAQUE IOHEXOL 300 MG/ML  SOLN COMPARISON:  Ultrasound abdomen 04/23/2015 FINDINGS: Moderate size right pleural effusion with probable loculation. Mild nodular pleural thickening. This may represent malignant effusion. Atelectasis or consolidation in the right lung base. Irregular nodule in the left lung base anteriorly measuring 11 mm diameter. Additional ground-glass nodules demonstrated in the left lung base. Metastasis is suspected. Emphysematous changes in the lungs.  Scattered calcified granulomas in the liver. No focal liver lesions otherwise demonstrated. Gallbladder is contracted, likely physiologic. The pancreas, spleen, kidneys, and inferior vena cava  are unremarkable. Calcification of abdominal aorta without aneurysm. No definite retroperitoneal lymphadenopathy. Decreased abdominal fat limits evaluation of the retroperitoneum. Stomach and small bowel are decompressed. Contrast material is present in the colon suggesting no evidence of bowel obstruction. Diffusely stool-filled colon. No free air or free fluid demonstrated in the abdomen. Pelvis: Calcification in the prostate gland. Mild thickening of the bladder wall may indicate cystitis or hypertrophy due to outlet obstruction. No free or loculated pelvic fluid collections. Appendix not identified. Rectosigmoid colon is mostly decompressed. There is a bone sclerosis at multiple endplates likely representing discogenic change. No definite destructive bone lesions. Degenerative changes in the lumbar spine. IMPRESSION: Moderate right pleural effusion with loculation and pleural nodularity suggesting malignant effusion. Atelectasis in the right lung base. Ground-glass nodules in the left lung base, largest 1.1 cm. This is likely metastatic although inflammatory nodularity not entirely excluded. No definite evidence of malignancy demonstrated in the abdomen or pelvis although technical factors limits evaluation of bowel and colon. Bladder wall thickening may be due to cystitis or outlet obstruction. Electronically Signed   By: Lucienne Capers M.D.   On: 04/27/2015 23:05   Ir Guided Niel Hummer W Catheter Placement  05/12/2015  CLINICAL DATA:  Malignant pleural effusion, shortness of breath EXAM: INSERTION OF TUNNELED PLEURAL DRAINAGE CATHETER ANESTHESIA/SEDATION: Intravenous Fentanyl and Versed were administered as conscious sedation during continuous cardiorespiratory monitoring by the radiology RN, with a total moderate  sedation time of 10 minutes. MEDICATIONS: As antibiotic prophylaxis, cefazolin 1 g was ordered pre-procedure and administered intravenously within one hour of incision. FLUOROSCOPY TIME:  42 seconds, 36 mGy. PROCEDURE: The procedure, risks, benefits, and alternatives were explained to the patient. Questions regarding the procedure were encouraged and answered. The patient understands and consents to the procedure. The right chest wall was prepped with Betadine in a sterile fashion, and a sterile drape was applied covering the operative field. A sterile gown and sterile gloves were used for the procedure. Local anesthesia was provided with 1% Lidocaine. Ultrasound image documentation was performed. Fluoroscopy during the procedure and fluoroscopic spot radiograph confirms appropriate catheter position. After creating a small skin incision, a 19 gauge needle was advanced into the pleural cavity under ultrasound guidance. A guide wire was then advanced under fluoroscopy into the pleural space. Pleural access was dilated serially and a 16-French peel-away sheath placed. A 16 French tunneled PleurX catheter was placed. This was tunneled from an incision 5 cm below the pleural access to the access site. The catheter was advanced through the peel-away sheath. The sheath was then removed. Final catheter positioning was confirmed with a fluoroscopic spot image. The access incision was closed with Dermabond applied to the incision. A Prolene retention suture was applied at the catheter exit site. Large volume thoracentesis was performed through the new catheter utilizing gravity drainage bag. COMPLICATIONS: None. FINDINGS: The catheter was placed via the right lateral chest wall. Catheter course is towards the apex. Approximately 1.0 liters of blood-tinged pleural fluid was able to be removed after catheter placement. The patient will be brought back in 10 to 14days to remove the temporary exit site retention suture after  appropriate in-growth around the subcutaneous cuff of the catheter. IMPRESSION: Placement of permanent, tunneled right pleural drainage catheter via lateral approach. 1.0 liters of pleural fluid was removed today after catheter placement. Electronically Signed   By: Lucrezia Europe M.D.   On: 05/12/2015 14:30   Ir US Guide Bx Asp/drain  05/12/2015  CLINICAL DATA:  Malignant pleural effusion,  shortness of breath EXAM: INSERTION OF TUNNELED PLEURAL DRAINAGE CATHETER ANESTHESIA/SEDATION: Intravenous Fentanyl and Versed were administered as conscious sedation during continuous cardiorespiratory monitoring by the radiology RN, with a total moderate sedation time of 10 minutes. MEDICATIONS: As antibiotic prophylaxis, cefazolin 1 g was ordered pre-procedure and administered intravenously within one hour of incision. FLUOROSCOPY TIME:  42 seconds, 36 mGy. PROCEDURE: The procedure, risks, benefits, and alternatives were explained to the patient. Questions regarding the procedure were encouraged and answered. The patient understands and consents to the procedure. The right chest wall was prepped with Betadine in a sterile fashion, and a sterile drape was applied covering the operative field. A sterile gown and sterile gloves were used for the procedure. Local anesthesia was provided with 1% Lidocaine. Ultrasound image documentation was performed. Fluoroscopy during the procedure and fluoroscopic spot radiograph confirms appropriate catheter position. After creating a small skin incision, a 19 gauge needle was advanced into the pleural cavity under ultrasound guidance. A guide wire was then advanced under fluoroscopy into the pleural space. Pleural access was dilated serially and a 16-French peel-away sheath placed. A 16 French tunneled PleurX catheter was placed. This was tunneled from an incision 5 cm below the pleural access to the access site. The catheter was advanced through the peel-away sheath. The sheath was then  removed. Final catheter positioning was confirmed with a fluoroscopic spot image. The access incision was closed with Dermabond applied to the incision. A Prolene retention suture was applied at the catheter exit site. Large volume thoracentesis was performed through the new catheter utilizing gravity drainage bag. COMPLICATIONS: None. FINDINGS: The catheter was placed via the right lateral chest wall. Catheter course is towards the apex. Approximately 1.0 liters of blood-tinged pleural fluid was able to be removed after catheter placement. The patient will be brought back in 10 to 14days to remove the temporary exit site retention suture after appropriate in-growth around the subcutaneous cuff of the catheter. IMPRESSION: Placement of permanent, tunneled right pleural drainage catheter via lateral approach. 1.0 liters of pleural fluid was removed today after catheter placement. Electronically Signed   By: Lucrezia Europe M.D.   On: 05/12/2015 14:30   Dg Chest Port 1 View  05/11/2015  CLINICAL DATA:  Right-sided Port-A-Cath insertion. EXAM: PORTABLE CHEST 1 VIEW COMPARISON:  05/09/2015 FINDINGS: New right anterior chest wall Port-A-Cath has its catheter tip projecting in the lower superior vena cava. There is no pneumothorax. Right hemi thorax is now almost completely opacified. Patchy opacity persists in the left mid and lower lung zone, stable. No left pleural effusion. IMPRESSION: 1. Right Port-A-Cath is well positioned with its tip in the lower superior vena cava. 2. There has been further worsening of lung aeration on the right now with the right hemi thorax walls completely opacified. 3. No pneumothorax. Electronically Signed   By: Lajean Manes M.D.   On: 05/11/2015 12:56   Dg Chest Port 1 View  04/27/2015  CLINICAL DATA:  Status post bronchoscopy with biopsy EXAM: PORTABLE CHEST 1 VIEW COMPARISON:  April 25, 2015. FINDINGS: The heart size and mediastinal contours are within normal limits. No  pneumothorax is noted. Mild right pleural effusion is noted. 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 Stable diffuse opacification of right upper lobe is noted. The visualized skeletal structures are unremarkable. IMPRESSION: Stable right upper lobe opacity is noted concerning for pneumonia. Mild right pleural effusion is noted. Electronically Signed   By: Jeneen Rinks  Murlean Caller, M.D.   On: 04/27/2015 10:36   Dg C-arm 1-60 Min-no Report  05/11/2015  CLINICAL DATA: Portacath placement C-ARM 1-60 MINUTES Fluoroscopy was utilized by the requesting physician.  No radiographic interpretation.   US Thoracentesis Asp Pleural Space W/img Guide  05/09/2015  CLINICAL DATA:  Shortness of breath, recurrent RIGHT pleural effusion EXAM: ULTRASOUND GUIDED 05/04/2015 THORACENTESIS COMPARISON:  None. PROCEDURE: Procedure, benefits, and risks of procedure were discussed with patient. Written informed consent for procedure was obtained. Time out protocol followed. Pleural effusion localized by ultrasound at the posterior RIGHT hemithorax. Skin prepped and draped in usual sterile fashion. Skin and soft tissues anesthetized with 8 mL of 1% lidocaine. 8 French thoracentesis catheter placed into the RIGHT pleural space. 1500 mL of dark old appearing bloody fluid aspirated by syringe pump. Procedure tolerated well by patient without immediate complication. COMPLICATIONS: None FINDINGS: As above IMPRESSION: Successful ultrasound guided RIGHT thoracentesis yielding 1500 mL of dark old appearing bloody pleural fluid. Electronically Signed   By: Lavonia Dana M.D.   On: 05/09/2015 11:17   US Thoracentesis Asp Pleural Space W/img Guide  05/04/2015  CLINICAL DATA:  Right pleural effusion EXAM: ULTRASOUND GUIDED RIGHT THORACENTESIS COMPARISON:  Chest CT 05/03/2015 PROCEDURE: An ultrasound guided thoracentesis was thoroughly discussed with the patient and questions answered. The benefits,  risks, alternatives and complications were also discussed. The patient understands and wishes to proceed with the procedure. Written consent was obtained. Ultrasound was performed to localize and mark an adequate pocket of fluid in the right posterior chest. The area was then prepped and draped in the normal sterile fashion. 1% Lidocaine was used for local anesthesia. Under ultrasound guidance a 19 gauge Yueh catheter was introduced. Thoracentesis was performed. The catheter was removed and a dressing applied. COMPLICATIONS: None. FINDINGS: A total of approximately 1.4 L of bloody fluid was removed. A fluid sample wassent for laboratory analysis. IMPRESSION: Successful ultrasound guided right thoracentesis yielding 1.4 L of pleural fluid. Electronically Signed   By: Rolm Baptise M.D.   On: 05/04/2015 12:31   Dg C-arm Bronchoscopy  04/27/2015  CLINICAL DATA:  C-ARM BRONCHOSCOPY Fluoroscopy was utilized by the requesting physician.  No radiographic interpretation.     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  Diagnosis on problem list is updated.  Oncology history developed.   I personally reviewed and went over pathology results with the patient.  He is provided an Rx for [employer].  I have given an RX for Oxycodone and Ventolin.  I wrote an order for O2.  His O2 saturation at rest was 91% on RA, it dropped to 87% with walking only 49ft on RA, and recovered to 94% with O2 at 2 LPM.  This Rx was given to [facility].  I discussed chemotherapy.  We will treat with Carbo/Taxol every 21 days.  I deferred discussion regarding prognosis and stage.  This will  be addressed in the near future.    He will need chemotherapy teaching.  Lupita Raider is notified.  Nurse will get him a urinal for at home.  Burtis Junes is notified by nursing regarding ensure.  CSW will be notified about transportation issues.  Return for chemotherapy teaching followed by chemotherapy next week.  Treatment plan built.  Pre-chemo labs: CBC diff, CMET, CEA, Mg,  Return on day 1 cycle 1 and/or 7-10 days after Day 1 of cycle 1 for Nadir check with labs.    THERAPY PLAN:  Chemotherapy teaching followed by systemic chemotherapy.  All questions were answered. The patient knows to call the clinic with any problems, questions or concerns. We can certainly see the patient much sooner if necessary.  Patient and plan discussed with Dr. Ancil Linsey and she is in agreement with the aforementioned.   This note is electronically signed by: Doy Mince 05/26/2015 6:22 PM

## 2015-05-26 NOTE — Patient Instructions (Signed)
[place] at [facility] Discharge Instructions  RECOMMENDATIONS MADE BY THE CONSULTANT AND ANY TEST RESULTS WILL BE SENT TO YOUR REFERRING PHYSICIAN.  Nutrition referral has been made. Social work referral has been made.  Oxygen has been ordered. You were given prescriptions for oxygen, nebulizer, and ventolin inhaler. Arloa Koh will do chemotherapy teaching and get you scheduled to start chemo probably next week. You can call [facility] to set up transportation. [phone] Return as scheduled.  Thank you for choosing [place] at [facility] to provide your oncology and hematology care.  To afford each patient quality time with our provider, please arrive at least 15 minutes before your scheduled appointment time.    You need to re-schedule your appointment should you arrive 10 or more minutes late.  We strive to give you quality time with our providers, and arriving late affects you and other patients whose appointments are after yours.  Also, if you no show three or more times for appointments you may be dismissed from the clinic at the providers discretion.     Again, thank you for choosing [facility].  Our hope is that these requests will decrease the amount of time that you wait before being seen by our physicians.       _____________________________________________________________  Should you have questions after your visit to [facility], please contact our office at [phone] between the hours of 8:30 a.m. and 4:30 p.m.  Voicemails left after 4:30 p.m. will not be returned until the following business day.  For prescription refill requests, have your pharmacy contact our office.

## 2015-05-27 ENCOUNTER — Encounter: Payer: Self-pay | Admitting: Dietician

## 2015-05-27 NOTE — Progress Notes (Signed)
Pt identified by MD/PA as needing a nutritional assessment  Briefly recall speaking with patient during his initial admission where he was found to have cancer   Contacted Pt by Phone. Spoke with pt and a couple family members.   Wt Readings from Last 10 Encounters:  05/26/15 123 lb 6.4 oz (55.974 kg)  05/19/15 129 lb 6.4 oz (58.695 kg)  05/11/15 131 lb (59.421 kg)  05/06/15 130 lb (58.968 kg)  04/27/15 150 lb (68.04 kg)   Patient weight has Decreased by 27 lbs in 1 months. Pt reports that much of that weight was from the empyema. However it did not appear that much was pulled off of him.   Unfortunately, it is very difficult to conversate with patient/family. They present relatively little information when speaking and have have minimal understanding of nutrition, They get confused very easily and are fairly hard to understand.   Patient reports oral intake as the same. He says he is eating "Hamburgers and stuff".  He didn't say anything about having trouble tolerating his food or any n/v/d/c.   Attempted to discuss oncologic nutrition. Emphasized high pro/kcal foods. When I told pt/family that fruit/vegetables/grains are not high in these, they interpreted that as pt cant eat vegetables or fruit. They asked a lot about how to prepare the foods rather than the foods themselves. Told family frying is better due to increased fat, but it doesn't matter too much. They Asked a lot about grain products: grits, rice. Advised pt to add dressings, sugars, sauces to these.   Pt states "they are supposed to send me cases of ensure, but i havent got them yet". Advised pt that I believe that PA/MD wanted me to help him get a case of Ensure, not that we would be sending a case to him. He says he has been drinking these. Will order a case for his next visit.   Will send them a list of high kcal/pro foods, but I am skeptical about how well they will be able to understand the information. Pt and family would  likely benefit from face to face interaction. Will try to meet with them as soon as schedule allows.   Mailed my contact info, coupons, and handouts titled "Increasing Calories and Protein"  Burtis Junes RD, LDN Nutrition Pager: [phone] 05/27/2015 4:07 PM

## 2015-05-31 ENCOUNTER — Other Ambulatory Visit (HOSPITAL_COMMUNITY): Payer: Self-pay | Admitting: Oncology

## 2015-05-31 ENCOUNTER — Encounter: Payer: Self-pay | Admitting: *Deleted

## 2015-05-31 DIAGNOSIS — C801 Malignant (primary) neoplasm, unspecified: Secondary | ICD-10-CM

## 2015-05-31 MED ORDER — ONDANSETRON HCL 8 MG PO TABS: 8.0000 mg | ORAL_TABLET | Freq: Three times a day (TID) | ORAL | Status: DC | PRN

## 2015-05-31 MED ORDER — DEXAMETHASONE 4 MG PO TABS: ORAL_TABLET | ORAL | Status: DC

## 2015-05-31 MED ORDER — PROCHLORPERAZINE MALEATE 10 MG PO TABS: 10.0000 mg | ORAL_TABLET | Freq: Four times a day (QID) | ORAL | Status: DC | PRN

## 2015-05-31 MED ORDER — LIDOCAINE-PRILOCAINE 2.5-2.5 % EX CREA: TOPICAL_CREAM | CUTANEOUS | Status: DC

## 2015-05-31 NOTE — Progress Notes (Signed)
[place] Clinical Social Work  Clinical Social Work was referred by Estate agent for assessment of psychosocial needs due to transportation concerns . Clinical Social Worker contacted patient at home to offer support and assess for needs. CSW spoke to patient's 52yo mother who assists with pt's care as she states he is so short of breath and energy that he cannot talk on the phone. CSW had some issues communicating with pt's mother, but after extended discussion she shared [place] had been contacted by family with no return call to date. CSW explained that transportation will be a challenge as pt has to cross [place] for his cancer care. Pt has MCD, but due [place] limited resources that [place] CSW educated pt's mother that they may have to rely on family and friends for transportation needs. CSW to provide family with [facility] resource through [employer] at his appt on 06/01/15. Mother stated understanding.   Clinical Social Work interventions: Resource education  Loren Racer, [place] Tuesdays   [phone]

## 2015-05-31 NOTE — Patient Instructions (Addendum)
Brian Nelson   CHEMOTHERAPY INSTRUCTIONS   Premeds: Zofran - for nausea/vomiting prevention/reduction. Dexamethasone - steroid - given to reduce the risk of you having an allergic type reaction to the chemotherapy. Dex can cause you to feel energized, nervous/anxious/jittery, make you have trouble sleeping, and/or make you feel hot/flushed in the face/neck and/or look pink/red in the face/neck. These side effects will pass as the Dex wears off. Benadryl - antihistamine - given to reduce/prevent an allergic type reaction to the Taxol chemo. Pepcid - antihistamine - given to reduce/prevent an allergic type reaction to the Taxol chemo. (takes 1 hour to infuse)  Taxol - the first time you receive this drug we will titrate it very slowly to ensure that you do not have or are not having an allergic reaction to the chemo. Side Effects: hair loss, lowers your white blood cells (fight infection), muscle aches, nausea/vomiting, irritation to the mouth (mouth sores, pain in your mouth) *neuropathy - numbness/tingling/burning in hands/fingers/feet/toes. We need to know as soon as this begins to happen so that we can monitor it and treat if necessary. The numbness generally begins in the fingertips of tips of toes and then begins to travel up the finger/toe/hand/foot. We never want you getting to where you can't pick up a pen, coin, zip a zipper, button a button, or have trouble walking. You must tell us immediately if you are experiencing peripheral neuropathy! (this will take 3 hours to infuse)  Carboplatin - this medication can be hard on your kidneys - this is why we need you to drink     m 64 oz of fluid (preferably water/decaff fluids) 2 days prior to chemo and for up to 4-5 days after chemo. Drink more if you can. This will help to keep your kidneys flushed. This can cause mild hair loss, lower your platelets (which keep you from bleeding out when you cut yourself), lower your  white blood cells (fight infection), and cause nausea/vomiting. (takes 30 minutes to infuse)  Neulasta - this medication is not chemo but being given because you have had chemo. It is usually given 27 hours after the completion of chemotherapy. This medication works by boosting your bone marrow's supply of white blood cells. White blood cells are what protect our bodies against infection. The medication is given in the form of a subcutaneous injection. It is given in the fatty tissue of your abdomen. It is a short needle. The major side effect of this medication is bone or muscle pain. The drug of choice to relieve or lessen the pain is Aleve or Ibuprofen. If a physician has ever told you not to take Aleve or Ibuprofen - then don't take it. You should then take Tylenol/acetaminophen. Take either medication as the bottle directs you to.  The level of pain you experience as a result of this injection can range from none, to mild or moderate, or severe. Please let us know if you develop moderate or severe bone pain.     POTENTIAL SIDE EFFECTS OF TREATMENT: Increased Susceptibility to Infection, Vomiting, Constipation, Hair Thinning, Changes in Character of Skin and Nails (brittleness, dryness,etc.), Bone Marrow Suppression, Abdominal Cramping, Complete Hair Loss, Nausea, Diarrhea, Sun Sensitivity and Mouth Sores   EDUCATIONAL MATERIALS GIVEN AND REVIEWED: Chemotherapy and You booklet Specific Instructions Sheets: Taxol, Carboplatin, Zofran, Dexamethasone, Benadryl, Pepcid, EMLA cream, Compazine   SELF CARE ACTIVITIES WHILE ON CHEMOTHERAPY: Increase your fluid intake 48 hours prior to treatment and drink at  least 2 quarts per day after treatment., No alcohol intake., No aspirin or other medications unless approved by your oncologist., Eat foods that are light and easy to digest., Eat foods at cold or room temperature., No fried, fatty, or spicy foods immediately before or after treatment., Have teeth  cleaned professionally before starting treatment. Keep dentures and partial plates clean., Use soft toothbrush and do not use mouthwashes that contain alcohol. Biotene is a good mouthwash that is available at most pharmacies or may be ordered by calling [phone]., Use warm salt water gargles (1 teaspoon salt per 1 quart warm water) before and after meals and at bedtime. Or you may rinse with 2 tablespoons of three -percent hydrogen peroxide mixed in eight ounces of water., Always use sunscreen with SPF (Sun Protection Factor) of 30 or higher., Use your nausea medication as directed to prevent nausea., Use your stool softener or laxative as directed to prevent constipation. and Use your anti-diarrheal medication as directed to stop diarrhea.  Please wash your hands for at least 30 seconds using warm soapy water. Handwashing is the #1 way to prevent the spread of germs. Stay away from sick people or people who are getting over a cold. If you develop respiratory systems such as green/yellow mucus production or productive cough or persistent cough let us know and we will see if you need an antibiotic. It is a good idea to keep a pair of gloves on when going into grocery stores/[employer] to decrease your risk of coming into contact with germs on the carts, etc. Carry alcohol hand gel with you at all times and use it frequently if out in public. All foods need to be cooked thoroughly. No raw foods. No medium or undercooked meats, eggs. If your food is cooked medium well, it does not need to be hot pink or saturated with bloody liquid at all. Vegetables and fruits need to be washed/rinsed under the faucet with a dish detergent before being consumed. You can eat raw fruits and vegetables unless we tell you otherwise but it would be best if you cooked them or bought frozen. Do not eat off of salad bars or hot bars unless you really trust the cleanliness of the restaurant. If you need dental work, please let [name]  know before you go for your appointment so that we can coordinate the best possible time for you in regards to your chemo regimen. You need to also let your dentist know that you are actively taking chemo. We may need to do labs prior to your dental appointment. We also want your bowels moving at least every other day. If this is not happening, we need to know so that we can get you on a bowel regimen to help you go. If you are going to have sex, you must use a condom to protect your partner from potential chemotherapy exposure. You must wear a condom for 1 month past the chemotherapy completion date.       MEDICATIONS: You have been given prescriptions for the following medications:  Dexamethasone '4mg'$  tablet. The day before chemo take 5 tabs in the am and 5 tabs in the pm. Then the day of chemo, take 5 tabs in the am prior to chemo appointment. Take with food.  Zofran '8mg'$  tablet. Take 1 tablet every 8 hours as needed for nausea/vomiting. (#1 nausea med to take, this can constipate)  Compazine '10mg'$  tablet. Take 1 tablet every 6 hours as needed for nausea/vomiting. (#2  nausea med to take, this can make you sleepy)  EMLA cream. Apply a quarter size amount to port site 1 hour prior to chemo. Do not rub in. Cover with plastic wrap.   Over-the-Counter Meds:  Miralax 1 capful in 8 oz of fluid daily. May increase to two times a day if needed. This is a stool softener. If this doesn't work proceed you can add:  Senokot S  - start with 1 tablet two times a day and increase to 4 tablets two times a day if needed. (total of 8 tablets in a 24 hour period). This is a stimulant laxative.   Call us if this does not help your bowels move.   Imodium '2mg'$  capsule. Take 2 capsules after the 1st loose stool and then 1 capsule every 2 hours until you go a total of 12 hours without having a loose stool. Call the [facility] if loose stools continue.    SYMPTOMS TO REPORT AS SOON AS POSSIBLE AFTER  TREATMENT:  FEVER GREATER THAN 100.5 F  CHILLS WITH OR WITHOUT FEVER  NAUSEA AND VOMITING THAT IS NOT CONTROLLED WITH YOUR NAUSEA MEDICATION  UNUSUAL SHORTNESS OF BREATH  UNUSUAL BRUISING OR BLEEDING  TENDERNESS IN MOUTH AND THROAT WITH OR WITHOUT PRESENCE OF ULCERS  URINARY PROBLEMS  BOWEL PROBLEMS  UNUSUAL RASH    Wear comfortable clothing and clothing appropriate for easy access to any Portacath or PICC line. Let us know if there is anything that we can do to make your therapy better!      I have been informed and understand all of the instructions given to me and have received a copy. I have been instructed to call the clinic [phone] or my family physician as soon as possible for continued medical care, if indicated. I do not have any more questions at this time but understand that I may call the [facility] or the Patient Navigator at [phone] during office hours should I have questions or need assistance in obtaining follow-up care.            Carboplatin injection What is this medicine? CARBOPLATIN (KAR boe pla tin) is a chemotherapy drug. It targets fast dividing cells, like cancer cells, and causes these cells to die. This medicine is used to treat ovarian cancer and many other cancers. This medicine may be used for other purposes; ask your health care provider or pharmacist if you have questions. What should I tell my health care provider before I take this medicine? They need to know if you have any of these conditions: -blood disorders -hearing problems -kidney disease -recent or ongoing radiation therapy -an unusual or allergic reaction to carboplatin, cisplatin, other chemotherapy, other medicines, foods, dyes, or preservatives -pregnant or trying to get pregnant -breast-feeding How should I use this medicine? This drug is usually given as an infusion into a vein. It is administered in a hospital or clinic by a specially trained health  care professional. Talk to your pediatrician regarding the use of this medicine in children. Special care may be needed. Overdosage: If you think you have taken too much of this medicine contact a poison control center or emergency room at once. NOTE: This medicine is only for you. Do not share this medicine with others. What if I miss a dose? It is important not to miss a dose. Call your doctor or health care professional if you are unable to keep an appointment. What may interact with this medicine? -medicines  for seizures -medicines to increase blood counts like filgrastim, pegfilgrastim, sargramostim -some antibiotics like amikacin, gentamicin, neomycin, streptomycin, tobramycin -vaccines Talk to your doctor or health care professional before taking any of these medicines: -acetaminophen -aspirin -ibuprofen -ketoprofen -naproxen This list may not describe all possible interactions. Give your health care provider a list of all the medicines, herbs, non-prescription drugs, or dietary supplements you use. Also tell them if you smoke, drink alcohol, or use illegal drugs. Some items may interact with your medicine. What should I watch for while using this medicine? Your condition will be monitored carefully while you are receiving this medicine. You will need important blood work done while you are taking this medicine. This drug may make you feel generally unwell. This is not uncommon, as chemotherapy can affect healthy cells as well as cancer cells. Report any side effects. Continue your course of treatment even though you feel ill unless your doctor tells you to stop. In some cases, you may be given additional medicines to help with side effects. Follow all directions for their use. Call your doctor or health care professional for advice if you get a fever, chills or sore throat, or other symptoms of a cold or flu. Do not treat yourself. This drug decreases your body's ability to fight  infections. Try to avoid being around people who are sick. This medicine may increase your risk to bruise or bleed. Call your doctor or health care professional if you notice any unusual bleeding. Be careful brushing and flossing your teeth or using a toothpick because you may get an infection or bleed more easily. If you have any dental work done, tell your dentist you are receiving this medicine. Avoid taking products that contain aspirin, acetaminophen, ibuprofen, naproxen, or ketoprofen unless instructed by your doctor. These medicines may hide a fever. Do not become pregnant while taking this medicine. Women should inform their doctor if they wish to become pregnant or think they might be pregnant. There is a potential for serious side effects to an unborn child. Talk to your health care professional or pharmacist for more information. Do not breast-feed an infant while taking this medicine. What side effects may I notice from receiving this medicine? Side effects that you should report to your doctor or health care professional as soon as possible: -allergic reactions like skin rash, itching or hives, swelling of the face, lips, or tongue -signs of infection - fever or chills, cough, sore throat, pain or difficulty passing urine -signs of decreased platelets or bleeding - bruising, pinpoint red spots on the skin, black, tarry stools, nosebleeds -signs of decreased red blood cells - unusually weak or tired, fainting spells, lightheadedness -breathing problems -changes in hearing -changes in vision -chest pain -high blood pressure -low blood counts - This drug may decrease the number of white blood cells, red blood cells and platelets. You may be at increased risk for infections and bleeding. -nausea and vomiting -pain, swelling, redness or irritation at the injection site -pain, tingling, numbness in the hands or feet -problems with balance, talking, walking -trouble passing urine or change  in the amount of urine Side effects that usually do not require medical attention (report to your doctor or health care professional if they continue or are bothersome): -hair loss -loss of appetite -metallic taste in the mouth or changes in taste This list may not describe all possible side effects. Call your doctor for medical advice about side effects. You may report side effects to FDA at  1-800-FDA-1088. Where should I keep my medicine? This drug is given in a hospital or clinic and will not be stored at home. NOTE: This sheet is a summary. It may not cover all possible information. If you have questions about this medicine, talk to your doctor, pharmacist, or health care provider.    2016, [employer]vier/Gold Standard. (2007-10-07 14:38:05) Paclitaxel injection What is this medicine? PACLITAXEL (PAK li TAX el) is a chemotherapy drug. It targets fast dividing cells, like cancer cells, and causes these cells to die. This medicine is used to treat ovarian cancer, breast cancer, and other cancers. This medicine may be used for other purposes; ask your health care provider or pharmacist if you have questions. What should I tell my health care provider before I take this medicine? They need to know if you have any of these conditions: -blood disorders -irregular heartbeat -infection (especially a virus infection such as chickenpox, cold sores, or herpes) -liver disease -previous or ongoing radiation therapy -an unusual or allergic reaction to paclitaxel, alcohol, polyoxyethylated castor oil, other chemotherapy agents, other medicines, foods, dyes, or preservatives -pregnant or trying to get pregnant -breast-feeding How should I use this medicine? This drug is given as an infusion into a vein. It is administered in a hospital or clinic by a specially trained health care professional. Talk to your pediatrician regarding the use of this medicine in children. Special care may be needed. Overdosage:  If you think you have taken too much of this medicine contact a poison control center or emergency room at once. NOTE: This medicine is only for you. Do not share this medicine with others. What if I miss a dose? It is important not to miss your dose. Call your doctor or health care professional if you are unable to keep an appointment. What may interact with this medicine? Do not take this medicine with any of the following medications: -disulfiram -metronidazole This medicine may also interact with the following medications: -cyclosporine -diazepam -ketoconazole -medicines to increase blood counts like filgrastim, pegfilgrastim, sargramostim -other chemotherapy drugs like cisplatin, doxorubicin, epirubicin, etoposide, teniposide, vincristine -quinidine -testosterone -vaccines -verapamil Talk to your doctor or health care professional before taking any of these medicines: -acetaminophen -aspirin -ibuprofen -ketoprofen -naproxen This list may not describe all possible interactions. Give your health care provider a list of all the medicines, herbs, non-prescription drugs, or dietary supplements you use. Also tell them if you smoke, drink alcohol, or use illegal drugs. Some items may interact with your medicine. What should I watch for while using this medicine? Your condition will be monitored carefully while you are receiving this medicine. You will need important blood work done while you are taking this medicine. This drug may make you feel generally unwell. This is not uncommon, as chemotherapy can affect healthy cells as well as cancer cells. Report any side effects. Continue your course of treatment even though you feel ill unless your doctor tells you to stop. This medicine can cause serious allergic reactions. To reduce your risk you will need to take other medicine(s) before treatment with this medicine. In some cases, you may be given additional medicines to help with side  effects. Follow all directions for their use. Call your doctor or health care professional for advice if you get a fever, chills or sore throat, or other symptoms of a cold or flu. Do not treat yourself. This drug decreases your body's ability to fight infections. Try to avoid being around people who are sick. This medicine  may increase your risk to bruise or bleed. Call your doctor or health care professional if you notice any unusual bleeding. Be careful brushing and flossing your teeth or using a toothpick because you may get an infection or bleed more easily. If you have any dental work done, tell your dentist you are receiving this medicine. Avoid taking products that contain aspirin, acetaminophen, ibuprofen, naproxen, or ketoprofen unless instructed by your doctor. These medicines may hide a fever. Do not become pregnant while taking this medicine. Women should inform their doctor if they wish to become pregnant or think they might be pregnant. There is a potential for serious side effects to an unborn child. Talk to your health care professional or pharmacist for more information. Do not breast-feed an infant while taking this medicine. Men are advised not to father a child while receiving this medicine. This product may contain alcohol. Ask your pharmacist or healthcare provider if this medicine contains alcohol. Be sure to tell all healthcare providers you are taking this medicine. Certain medicines, like metronidazole and disulfiram, can cause an unpleasant reaction when taken with alcohol. The reaction includes flushing, headache, nausea, vomiting, sweating, and increased thirst. The reaction can last from 30 minutes to several hours. What side effects may I notice from receiving this medicine? Side effects that you should report to your doctor or health care professional as soon as possible: -allergic reactions like skin rash, itching or hives, swelling of the face, lips, or tongue -low blood  counts - This drug may decrease the number of white blood cells, red blood cells and platelets. You may be at increased risk for infections and bleeding. -signs of infection - fever or chills, cough, sore throat, pain or difficulty passing urine -signs of decreased platelets or bleeding - bruising, pinpoint red spots on the skin, black, tarry stools, nosebleeds -signs of decreased red blood cells - unusually weak or tired, fainting spells, lightheadedness -breathing problems -chest pain -high or low blood pressure -mouth sores -nausea and vomiting -pain, swelling, redness or irritation at the injection site -pain, tingling, numbness in the hands or feet -slow or irregular heartbeat -swelling of the ankle, feet, hands Side effects that usually do not require medical attention (report to your doctor or health care professional if they continue or are bothersome): -bone pain -complete hair loss including hair on your head, underarms, pubic hair, eyebrows, and eyelashes -changes in the color of fingernails -diarrhea -loosening of the fingernails -loss of appetite -muscle or joint pain -red flush to skin -sweating This list may not describe all possible side effects. Call your doctor for medical advice about side effects. You may report side effects to FDA at 1-800-FDA-1088. Where should I keep my medicine? This drug is given in a hospital or clinic and will not be stored at home. NOTE: This sheet is a summary. It may not cover all possible information. If you have questions about this medicine, talk to your doctor, pharmacist, or health care provider.    2016, [employer]/Gold Standard. (2015-02-17 13:02:56) Ondansetron injection What is this medicine? ONDANSETRON (on DAN se tron) is used to treat nausea and vomiting caused by chemotherapy. It is also used to prevent or treat nausea and vomiting after surgery. This medicine may be used for other purposes; ask your health care provider or  pharmacist if you have questions. What should I tell my health care provider before I take this medicine? They need to know if you have any of these conditions: -heart disease -  history of irregular heartbeat -liver disease -low levels of magnesium or potassium in the blood -an unusual or allergic reaction to ondansetron, granisetron, other medicines, foods, dyes, or preservatives -pregnant or trying to get pregnant -breast-feeding How should I use this medicine? This medicine is for infusion into a vein. It is given by a health care professional in a hospital or clinic setting. Talk to your pediatrician regarding the use of this medicine in children. Special care may be needed. Overdosage: If you think you have taken too much of this medicine contact a poison control center or emergency room at once. NOTE: This medicine is only for you. Do not share this medicine with others. What if I miss a dose? This does not apply. What may interact with this medicine? Do not take this medicine with any of the following medications: -apomorphine -certain medicines for fungal infections like fluconazole, itraconazole, ketoconazole, posaconazole, voriconazole -cisapride -dofetilide -dronedarone -pimozide -thioridazine -ziprasidone This medicine may also interact with the following medications: -carbamazepine -certain medicines for depression, anxiety, or psychotic disturbances -fentanyl -linezolid -MAOIs like Carbex, Eldepryl, Marplan, Nardil, and Parnate -methylene blue (injected into a vein) -other medicines that prolong the QT interval (cause an abnormal heart rhythm) -phenytoin -rifampicin -tramadol This list may not describe all possible interactions. Give your health care provider a list of all the medicines, herbs, non-prescription drugs, or dietary supplements you use. Also tell them if you smoke, drink alcohol, or use illegal drugs. Some items may interact with your medicine. What  should I watch for while using this medicine? Your condition will be monitored carefully while you are receiving this medicine. What side effects may I notice from receiving this medicine? Side effects that you should report to your doctor or health care professional as soon as possible: -allergic reactions like skin rash, itching or hives, swelling of the face, lips, or tongue -breathing problems -confusion -dizziness -fast or irregular heartbeat -feeling faint or lightheaded, falls -fever and chills -loss of balance or coordination -seizures -sweating -swelling of the hands and feet -tightness in the chest -tremors -unusually weak or tired Side effects that usually do not require medical attention (report to your doctor or health care professional if they continue or are bothersome): -constipation or diarrhea -headache This list may not describe all possible side effects. Call your doctor for medical advice about side effects. You may report side effects to FDA at 1-800-FDA-1088. Where should I keep my medicine? This drug is given in a hospital or clinic and will not be stored at home. NOTE: This sheet is a summary. It may not cover all possible information. If you have questions about this medicine, talk to your doctor, pharmacist, or health care provider.    2016, [employer] Standard. (2013-04-08 16:18:28) Dexamethasone injection What is this medicine? DEXAMETHASONE (dex a METH a sone) is a corticosteroid. It is used to treat inflammation of the skin, joints, lungs, and other organs. Common conditions treated include asthma, allergies, and arthritis. It is also used for other conditions, like blood disorders and diseases of the adrenal glands. This medicine may be used for other purposes; ask your health care provider or pharmacist if you have questions. What should I tell my health care provider before I take this medicine? They need to know if you have any of these  conditions: -blood clotting problems -Cushing's syndrome -diabetes -glaucoma -heart problems or disease -high blood pressure -infection like herpes, measles, tuberculosis, or chickenpox -kidney disease -liver disease -mental problems -myasthenia gravis -osteoporosis -previous  heart attack -seizures -stomach, ulcer or intestine disease including colitis and diverticulitis -thyroid problem -an unusual or allergic reaction to dexamethasone, corticosteroids, other medicines, lactose, foods, dyes, or preservatives -pregnant or trying to get pregnant -breast-feeding How should I use this medicine? This medicine is for injection into a muscle, joint, lesion, soft tissue, or vein. It is given by a health care professional in a hospital or clinic setting. Talk to your pediatrician regarding the use of this medicine in children. Special care may be needed. Overdosage: If you think you have taken too much of this medicine contact a poison control center or emergency room at once. NOTE: This medicine is only for you. Do not share this medicine with others. What if I miss a dose? This may not apply. If you are having a series of injections over a prolonged period, try not to miss an appointment. Call your doctor or health care professional to reschedule if you are unable to keep an appointment. What may interact with this medicine? Do not take this medicine with any of the following medications: -mifepristone, RU-486 -vaccines This medicine may also interact with the following medications: -amphotericin B -antibiotics like clarithromycin, erythromycin, and troleandomycin -aspirin and aspirin-like drugs -barbiturates like phenobarbital -carbamazepine -cholestyramine -cholinesterase inhibitors like donepezil, galantamine, rivastigmine, and tacrine -cyclosporine -digoxin -diuretics -ephedrine -male hormones, like estrogens or progestins and birth control  pills -indinavir -isoniazid -ketoconazole -medicines for diabetes -medicines that improve muscle tone or strength for conditions like myasthenia gravis -NSAIDs, medicines for pain and inflammation, like ibuprofen or naproxen -phenytoin -rifampin -thalidomide -warfarin This list may not describe all possible interactions. Give your health care provider a list of all the medicines, herbs, non-prescription drugs, or dietary supplements you use. Also tell them if you smoke, drink alcohol, or use illegal drugs. Some items may interact with your medicine. What should I watch for while using this medicine? Your condition will be monitored carefully while you are receiving this medicine. If you are taking this medicine for a long time, carry an identification card with your name and address, the type and dose of your medicine, and your doctor's name and address. This medicine may increase your risk of getting an infection. Stay away from people who are sick. Tell your doctor or health care professional if you are around anyone with measles or chickenpox. Talk to your health care provider before you get any vaccines that you take this medicine. If you are going to have surgery, tell your doctor or health care professional that you have taken this medicine within the last twelve months. Ask your doctor or health care professional about your diet. You may need to lower the amount of salt you eat. The medicine can increase your blood sugar. If you are a diabetic check with your doctor if you need help adjusting the dose of your diabetic medicine. What side effects may I notice from receiving this medicine? Side effects that you should report to your doctor or health care professional as soon as possible: -allergic reactions like skin rash, itching or hives, swelling of the face, lips, or tongue -black or tarry stools -change in the amount of urine -changes in vision -confusion, excitement, restlessness,  a false sense of well-being -fever, sore throat, sneezing, cough, or other signs of infection, wounds that will not heal -hallucinations -increased thirst -mental depression, mood swings, mistaken feelings of self importance or of being mistreated -pain in hips, back, ribs, arms, shoulders, or legs -pain, redness, or irritation at the  injection site -redness, blistering, peeling or loosening of the skin, including inside the mouth -rounding out of face -swelling of feet or lower legs -unusual bleeding or bruising -unusual tired or weak -wounds that do not heal Side effects that usually do not require medical attention (report to your doctor or health care professional if they continue or are bothersome): -diarrhea or constipation -change in taste -headache -nausea, vomiting -skin problems, acne, thin and shiny skin -touble sleeping -unusual growth of hair on the face or body -weight gain This list may not describe all possible side effects. Call your doctor for medical advice about side effects. You may report side effects to FDA at 1-800-FDA-1088. Where should I keep my medicine? This drug is given in a hospital or clinic and will not be stored at home. NOTE: This sheet is a summary. It may not cover all possible information. If you have questions about this medicine, talk to your doctor, pharmacist, or health care provider.    2016, [employer] Standard. (2007-10-23 14:04:12) Diphenhydramine injection What is this medicine? DIPHENHYDRAMINE (dye fen HYE dra meen) is an antihistamine. It is used to treat the symptoms of an allergic reaction and motion sickness. It is also used to treat Parkinson's disease. This medicine may be used for other purposes; ask your health care provider or pharmacist if you have questions. What should I tell my health care provider before I take this medicine? They need to know if you have any of these conditions: -asthma or lung  disease -glaucoma -high blood pressure or heart disease -liver disease -pain or difficulty passing urine -prostate trouble -ulcers or other stomach problems -an unusual or allergic reaction to diphenhydramine, antihistamines, other medicines foods, dyes, or preservatives -pregnant or trying to get pregnant -breast-feeding How should I use this medicine? This medicine is for injection into a vein or a muscle. It is usually given by a health care professional in a hospital or clinic setting. If you get this medicine at home, you will be taught how to prepare and give this medicine. Use exactly as directed. Take your medicine at regular intervals. Do not take your medicine more often than directed. It is important that you put your used needles and syringes in a special sharps container. Do not put them in a trash can. If you do not have a sharps container, call your pharmacist or healthcare provider to get one. Talk to your pediatrician regarding the use of this medicine in children. While this drug may be prescribed for selected conditions, precautions do apply. This medicine is not approved for use in newborns and premature babies. Patients over 27 years old may have a stronger reaction and need a smaller dose. Overdosage: If you think you have taken too much of this medicine contact a poison control center or emergency room at once. NOTE: This medicine is only for you. Do not share this medicine with others. What if I miss a dose? If you miss a dose, take it as soon as you can. If it is almost time for your next dose, take only that dose. Do not take double or extra doses. What may interact with this medicine? Do not take this medicine with any of the following medications: -MAOIs like Carbex, Eldepryl, Marplan, Nardil, and Parnate This medicine may also interact with the following medications: -alcohol -barbiturates, like phenobarbital -medicines for bladder spasm like oxybutynin,  tolterodine -medicines for blood pressure -medicines for depression, anxiety, or psychotic disturbances -medicines for movement abnormalities or Parkinson's  disease -medicines for sleep -other medicines for cold, cough or allergy -some medicines for the stomach like chlordiazepoxide, dicyclomine This list may not describe all possible interactions. Give your health care provider a list of all the medicines, herbs, non-prescription drugs, or dietary supplements you use. Also tell them if you smoke, drink alcohol, or use illegal drugs. Some items may interact with your medicine. What should I watch for while using this medicine? Your condition will be monitored carefully while you are receiving this medicine. Tell your doctor or healthcare professional if your symptoms do not start to get better or if they get worse. You may get drowsy or dizzy. Do not drive, use machinery, or do anything that needs mental alertness until you know how this medicine affects you. Do not stand or sit up quickly, especially if you are an older patient. This reduces the risk of dizzy or fainting spells. Alcohol may interfere with the effect of this medicine. Avoid alcoholic drinks. Your mouth may get dry. Chewing sugarless gum or sucking hard candy, and drinking plenty of water may help. Contact your doctor if the problem does not go away or is severe. What side effects may I notice from receiving this medicine? Side effects that you should report to your doctor or health care professional as soon as possible: -allergic reactions like skin rash, itching or hives, swelling of the face, lips, or tongue -breathing problems -changes in vision -chills -confused, agitated, nervous -irregular or fast heartbeat -low blood pressure -seizures -tremor -trouble passing urine -unusual bleeding or bruising -unusually weak or tired Side effects that usually do not require medical attention (report to your doctor or health care  professional if they continue or are bothersome): -constipation, diarrhea -drowsy -headache -loss of appetite -stomach upset, vomiting -sweating -thick mucous This list may not describe all possible side effects. Call your doctor for medical advice about side effects. You may report side effects to FDA at 1-800-FDA-1088. Where should I keep my medicine? Keep out of the reach of children. If you are using this medicine at home, you will be instructed on how to store this medicine. Throw away any unused medicine after the expiration date on the label. NOTE: This sheet is a summary. It may not cover all possible information. If you have questions about this medicine, talk to your doctor, pharmacist, or health care provider.    2016, [employer] Standard. (2007-10-21 14:28:35) Famotidine injection What is this medicine? FAMOTIDINE (fa MOE ti deen) is a type of antihistamine that blocks the release of stomach acid. It is used to treat stomach or intestinal ulcers. It can relieve ulcer pain and discomfort, and the heartburn from acid reflux. This medicine may be used for other purposes; ask your health care provider or pharmacist if you have questions. What should I tell my health care provider before I take this medicine? They need to know if you have any of these conditions: -kidney or liver disease -an unusual or allergic reaction to famotidine, other medicines, foods, dyes, or preservatives -pregnant or trying to get pregnant -breast-feeding How should I use this medicine? This medicine is for infusion into a vein. It is given by a health care professional in a hospital or clinic setting. Talk to your pediatrician regarding the use of this medicine in children. Special care may be needed. Overdosage: If you think you have taken too much of this medicine contact a poison control center or emergency room at once. NOTE: This medicine is only for you.  Do not share this medicine with  others. What if I miss a dose? This does not apply. What may interact with this medicine? -delavirdine -itraconazole -ketoconazole This list may not describe all possible interactions. Give your health care provider a list of all the medicines, herbs, non-prescription drugs, or dietary supplements you use. Also tell them if you smoke, drink alcohol, or use illegal drugs. Some items may interact with your medicine. What should I watch for while using this medicine? Tell your doctor or health care professional if your condition does not start to get better or gets worse. Do not take with aspirin, ibuprofen, or other antiinflammatory medicines. These can aggravate your condition. Do not smoke cigarettes or drink alcohol. These increase irritation in your stomach and can increase the time it will take for ulcers to heal. Cigarettes and alcohol can also worsen acid reflux or heartburn. If you get black, tarry stools or vomit up what looks like coffee grounds, call your doctor or health care professional at once. You may have a bleeding ulcer. What side effects may I notice from receiving this medicine? Side effects that you should report to your doctor or health care professional as soon as possible: -allergic reactions like skin rash, itching or hives, swelling of the face, lips, or tongue -agitation, nervousness -confusion -hallucinations Side effects that usually do not require medical attention (report to your doctor or health care professional if they continue or are bothersome): -constipation -diarrhea -dizziness -headache This list may not describe all possible side effects. Call your doctor for medical advice about side effects. You may report side effects to FDA at 1-800-FDA-1088. Where should I keep my medicine? This medicine is given in a hospital or clinic. You will not be given this medicine to store at home. NOTE: This sheet is a summary. It may not cover all possible information.  If you have questions about this medicine, talk to your doctor, pharmacist, or health care provider.    2016, [employer] Standard. (2007-11-05 13:24:51) Ondansetron tablets What is this medicine? ONDANSETRON (on DAN se tron) is used to treat nausea and vomiting caused by chemotherapy. It is also used to prevent or treat nausea and vomiting after surgery. This medicine may be used for other purposes; ask your health care provider or pharmacist if you have questions. What should I tell my health care provider before I take this medicine? They need to know if you have any of these conditions: -heart disease -history of irregular heartbeat -liver disease -low levels of magnesium or potassium in the blood -an unusual or allergic reaction to ondansetron, granisetron, other medicines, foods, dyes, or preservatives -pregnant or trying to get pregnant -breast-feeding How should I use this medicine? Take this medicine by mouth with a glass of water. Follow the directions on your prescription label. Take your doses at regular intervals. Do not take your medicine more often than directed. Talk to your pediatrician regarding the use of this medicine in children. Special care may be needed. Overdosage: If you think you have taken too much of this medicine contact a poison control center or emergency room at once. NOTE: This medicine is only for you. Do not share this medicine with others. What if I miss a dose? If you miss a dose, take it as soon as you can. If it is almost time for your next dose, take only that dose. Do not take double or extra doses. What may interact with this medicine? Do not take this medicine with  any of the following medications: -apomorphine -certain medicines for fungal infections like fluconazole, itraconazole, ketoconazole, posaconazole, voriconazole -cisapride -dofetilide -dronedarone -pimozide -thioridazine -ziprasidone This medicine may also interact with the  following medications: -carbamazepine -certain medicines for depression, anxiety, or psychotic disturbances -fentanyl -linezolid -MAOIs like Carbex, Eldepryl, Marplan, Nardil, and Parnate -methylene blue (injected into a vein) -other medicines that prolong the QT interval (cause an abnormal heart rhythm) -phenytoin -rifampicin -tramadol This list may not describe all possible interactions. Give your health care provider a list of all the medicines, herbs, non-prescription drugs, or dietary supplements you use. Also tell them if you smoke, drink alcohol, or use illegal drugs. Some items may interact with your medicine. What should I watch for while using this medicine? Check with your doctor or health care professional right away if you have any sign of an allergic reaction. What side effects may I notice from receiving this medicine? Side effects that you should report to your doctor or health care professional as soon as possible: -allergic reactions like skin rash, itching or hives, swelling of the face, lips or tongue -breathing problems -confusion -dizziness -fast or irregular heartbeat -feeling faint or lightheaded, falls -fever and chills -loss of balance or coordination -seizures -sweating -swelling of the hands or feet -tightness in the chest -tremors -unusually weak or tired Side effects that usually do not require medical attention (report to your doctor or health care professional if they continue or are bothersome): -constipation or diarrhea -headache This list may not describe all possible side effects. Call your doctor for medical advice about side effects. You may report side effects to FDA at 1-800-FDA-1088. Where should I keep my medicine? Keep out of the reach of children. Store between 2 and 30 degrees C (36 and 86 degrees F). Throw away any unused medicine after the expiration date. NOTE: This sheet is a summary. It may not cover all possible information. If  you have questions about this medicine, talk to your doctor, pharmacist, or health care provider.    2016, [employer]. (2013-04-08 16:27:45) Prochlorperazine tablets What is this medicine? PROCHLORPERAZINE (proe klor PER a zeen) helps to control severe nausea and vomiting. This medicine is also used to treat schizophrenia. It can also help patients who experience anxiety that is not due to psychological illness. This medicine may be used for other purposes; ask your health care provider or pharmacist if you have questions. What should I tell my health care provider before I take this medicine? They need to know if you have any of these conditions: -blood disorders or disease -dementia -liver disease or jaundice -Parkinson's disease -uncontrollable movement disorder -an unusual or allergic reaction to prochlorperazine, other medicines, foods, dyes, or preservatives -pregnant or trying to get pregnant -breast-feeding How should I use this medicine? Take this medicine by mouth with a glass of water. Follow the directions on the prescription label. Take your doses at regular intervals. Do not take your medicine more often than directed. Do not stop taking this medicine suddenly. This can cause nausea, vomiting, and dizziness. Ask your doctor or health care professional for advice. Talk to your pediatrician regarding the use of this medicine in children. Special care may be needed. While this drug may be prescribed for children as young as 2 years for selected conditions, precautions do apply. Overdosage: If you think you have taken too much of this medicine contact a poison control center or emergency room at once. NOTE: This medicine is only for you. Do not  share this medicine with others. What if I miss a dose? If you miss a dose, take it as soon as you can. If it is almost time for your next dose, take only that dose. Do not take double or extra doses. What may interact with this  medicine? Do not take this medicine with any of the following medications: -amoxapine -antidepressants like citalopram, escitalopram, fluoxetine, paroxetine, and sertraline -deferoxamine -dofetilide -maprotiline -tricyclic antidepressants like amitriptyline, clomipramine, imipramine, nortiptyline and others This medicine may also interact with the following medications: -lithium -medicines for pain -phenytoin -propranolol -warfarin This list may not describe all possible interactions. Give your health care provider a list of all the medicines, herbs, non-prescription drugs, or dietary supplements you use. Also tell them if you smoke, drink alcohol, or use illegal drugs. Some items may interact with your medicine. What should I watch for while using this medicine? Visit your doctor or health care professional for regular checks on your progress. You may get drowsy or dizzy. Do not drive, use machinery, or do anything that needs mental alertness until you know how this medicine affects you. Do not stand or sit up quickly, especially if you are an older patient. This reduces the risk of dizzy or fainting spells. Alcohol may interfere with the effect of this medicine. Avoid alcoholic drinks. This medicine can reduce the response of your body to heat or cold. Dress warm in cold weather and stay hydrated in hot weather. If possible, avoid extreme temperatures like saunas, hot tubs, very hot or cold showers, or activities that can cause dehydration such as vigorous exercise. This medicine can make you more sensitive to the sun. Keep out of the sun. If you cannot avoid being in the sun, wear protective clothing and use sunscreen. Do not use sun lamps or tanning beds/booths. Your mouth may get dry. Chewing sugarless gum or sucking hard candy, and drinking plenty of water may help. Contact your doctor if the problem does not go away or is severe. What side effects may I notice from receiving this  medicine? Side effects that you should report to your doctor or health care professional as soon as possible: -blurred vision -breast enlargement in men or women -breast milk in women who are not breast-feeding -chest pain, fast or irregular heartbeat -confusion, restlessness -dark yellow or brown urine -difficulty breathing or swallowing -dizziness or fainting spells -drooling, shaking, movement difficulty (shuffling walk) or rigidity -fever, chills, sore throat -involuntary or uncontrollable movements of the eyes, mouth, head, arms, and legs -seizures -stomach area pain -unusually weak or tired -unusual bleeding or bruising -yellowing of skin or eyes Side effects that usually do not require medical attention (report to your doctor or health care professional if they continue or are bothersome): -difficulty passing urine -difficulty sleeping -headache -sexual dysfunction -skin rash, or itching This list may not describe all possible side effects. Call your doctor for medical advice about side effects. You may report side effects to FDA at 1-800-FDA-1088. Where should I keep my medicine? Keep out of the reach of children. Store at room temperature between 15 and 30 degrees C (59 and 86 degrees F). Protect from light. Throw away any unused medicine after the expiration date. NOTE: This sheet is a summary. It may not cover all possible information. If you have questions about this medicine, talk to your doctor, pharmacist, or health care provider.    2016, [employer]. (2011-11-20 16:59:39) Lidocaine; Prilocaine cream What is this medicine? LIDOCAINE; PRILOCAINE (LYE doe  kane; PRIL oh kane) is a topical anesthetic that causes loss of feeling in the skin and surrounding tissues. It is used to numb the skin before procedures or injections. This medicine may be used for other purposes; ask your health care provider or pharmacist if you have questions. What should I tell my  health care provider before I take this medicine? They need to know if you have any of these conditions: -glucose-6-phosphate deficiencies -heart disease -kidney or liver disease -methemoglobinemia -an unusual or allergic reaction to lidocaine, prilocaine, other medicines, foods, dyes, or preservatives -pregnant or trying to get pregnant -breast-feeding How should I use this medicine? This medicine is for external use only on the skin. Do not take by mouth. Follow the directions on the prescription label. Wash hands before and after use. Do not use more or leave in contact with the skin longer than directed. Do not apply to eyes or open wounds. It can cause irritation and blurred or temporary loss of vision. If this medicine comes in contact with your eyes, immediately rinse the eye with water. Do not touch or rub the eye. Contact your health care provider right away. Talk to your pediatrician regarding the use of this medicine in children. While this medicine may be prescribed for children for selected conditions, precautions do apply. Overdosage: If you think you have taken too much of this medicine contact a poison control center or emergency room at once. NOTE: This medicine is only for you. Do not share this medicine with others. What if I miss a dose? This medicine is usually only applied once prior to each procedure. It must be in contact with the skin for a period of time for it to work. If you applied this medicine later than directed, tell your health care professional before starting the procedure. What may interact with this medicine? -acetaminophen -chloroquine -dapsone -medicines to control heart rhythm -nitrates like nitroglycerin and nitroprusside -other ointments, creams, or sprays that may contain anesthetic medicine -phenobarbital -phenytoin -quinine -sulfonamides like sulfacetamide, sulfamethoxazole, sulfasalazine and others This list may not describe all possible  interactions. Give your health care provider a list of all the medicines, herbs, non-prescription drugs, or dietary supplements you use. Also tell them if you smoke, drink alcohol, or use illegal drugs. Some items may interact with your medicine. What should I watch for while using this medicine? Be careful to avoid injury to the treated area while it is numb and you are not aware of pain. Avoid scratching, rubbing, or exposing the treated area to hot or cold temperatures until complete sensation has returned. The numb feeling will wear off a few hours after applying the cream. What side effects may I notice from receiving this medicine? Side effects that you should report to your doctor or health care professional as soon as possible: -blurred vision -chest pain -difficulty breathing -dizziness -drowsiness -fast or irregular heartbeat -skin rash or itching -swelling of your throat, lips, or face -trembling Side effects that usually do not require medical attention (report to your doctor or health care professional if they continue or are bothersome): -changes in ability to feel hot or cold -redness and swelling at the application site This list may not describe all possible side effects. Call your doctor for medical advice about side effects. You may report side effects to FDA at 1-800-FDA-1088. Where should I keep my medicine? Keep out of reach of children. Store at room temperature between 15 and 30 degrees C (59 and 86  degrees F). Keep container tightly closed. Throw away any unused medicine after the expiration date. NOTE: This sheet is a summary. It may not cover all possible information. If you have questions about this medicine, talk to your doctor, pharmacist, or health care provider.    2016, [employer]. (2008-01-05 17:14:35) Dexamethasone tablets What is this medicine? DEXAMETHASONE (dex a METH a sone) is a corticosteroid. It is commonly used to treat inflammation of  the skin, joints, lungs, and other organs. Common conditions treated include asthma, allergies, and arthritis. It is also used for other conditions, such as blood disorders and diseases of the adrenal glands. This medicine may be used for other purposes; ask your health care provider or pharmacist if you have questions. What should I tell my health care provider before I take this medicine? They need to know if you have any of these conditions: -Cushing's syndrome -diabetes -glaucoma -heart problems or disease -high blood pressure -infection like herpes, measles, tuberculosis, or chickenpox -kidney disease -liver disease -mental problems -myasthenia gravis -osteoporosis -previous heart attack -seizures -stomach, ulcer or intestine disease including colitis and diverticulitis -thyroid problem -an unusual or allergic reaction to dexamethasone, corticosteroids, other medicines, lactose, foods, dyes, or preservatives -pregnant or trying to get pregnant -breast-feeding How should I use this medicine? Take this medicine by mouth with a drink of water. Follow the directions on the prescription label. Take it with food or milk to avoid stomach upset. If you are taking this medicine once a day, take it in the morning. Do not take more medicine than you are told to take. Do not suddenly stop taking your medicine because you may develop a severe reaction. Your doctor will tell you how much medicine to take. If your doctor wants you to stop the medicine, the dose may be slowly lowered over time to avoid any side effects. Talk to your pediatrician regarding the use of this medicine in children. Special care may be needed. Patients over 42 years old may have a stronger reaction and need a smaller dose. Overdosage: If you think you have taken too much of this medicine contact a poison control center or emergency room at once. NOTE: This medicine is only for you. Do not share this medicine with  others. What if I miss a dose? If you miss a dose, take it as soon as you can. If it is almost time for your next dose, talk to your doctor or health care professional. You may need to miss a dose or take an extra dose. Do not take double or extra doses without advice. What may interact with this medicine? Do not take this medicine with any of the following medications: -mifepristone, RU-486 -vaccines This medicine may also interact with the following medications: -amphotericin B -antibiotics like clarithromycin, erythromycin, and troleandomycin -aspirin and aspirin-like drugs -barbiturates like phenobarbital -carbamazepine -cholestyramine -cholinesterase inhibitors like donepezil, galantamine, rivastigmine, and tacrine -cyclosporine -digoxin -diuretics -ephedrine -male hormones, like estrogens or progestins and birth control pills -indinavir -isoniazid -ketoconazole -medicines for diabetes -medicines that improve muscle tone or strength for conditions like myasthenia gravis -NSAIDs, medicines for pain and inflammation, like ibuprofen or naproxen -phenytoin -rifampin -thalidomide -warfarin This list may not describe all possible interactions. Give your health care provider a list of all the medicines, herbs, non-prescription drugs, or dietary supplements you use. Also tell them if you smoke, drink alcohol, or use illegal drugs. Some items may interact with your medicine. What should I watch for while using this medicine? Visit  your doctor or health care professional for regular checks on your progress. If you are taking this medicine over a prolonged period, carry an identification card with your name and address, the type and dose of your medicine, and your doctor's name and address. This medicine may increase your risk of getting an infection. Stay away from people who are sick. Tell your doctor or health care professional if you are around anyone with measles or chickenpox. If  you are going to have surgery, tell your doctor or health care professional that you have taken this medicine within the last twelve months. Ask your doctor or health care professional about your diet. You may need to lower the amount of salt you eat. The medicine can increase your blood sugar. If you are a diabetic check with your doctor if you need help adjusting the dose of your diabetic medicine. What side effects may I notice from receiving this medicine? Side effects that you should report to your doctor or health care professional as soon as possible: -allergic reactions like skin rash, itching or hives, swelling of the face, lips, or tongue -changes in vision -fever, sore throat, sneezing, cough, or other signs of infection, wounds that will not heal -increased thirst -mental depression, mood swings, mistaken feelings of self importance or of being mistreated -pain in hips, back, ribs, arms, shoulders, or legs -redness, blistering, peeling or loosening of the skin, including inside the mouth -trouble passing urine or change in the amount of urine -swelling of feet or lower legs -unusual bleeding or bruising Side effects that usually do not require medical attention (report to your doctor or health care professional if they continue or are bothersome): -headache -nausea, vomiting -skin problems, acne, thin and shiny skin -weight gain This list may not describe all possible side effects. Call your doctor for medical advice about side effects. You may report side effects to FDA at 1-800-FDA-1088. Where should I keep my medicine? Keep out of the reach of children. Store at room temperature between 20 and 25 degrees C (68 and 77 degrees F). Protect from light. Throw away any unused medicine after the expiration date. NOTE: This sheet is a summary. It may not cover all possible information. If you have questions about this medicine, talk to your doctor, pharmacist, or health care  provider.    2016, [employer]. (2007-10-23 14:02:13) Pegfilgrastim injection What is this medicine? PEGFILGRASTIM (PEG fil gra stim) is a long-acting granulocyte colony-stimulating factor that stimulates the growth of neutrophils, a type of white blood cell important in the body's fight against infection. It is used to reduce the incidence of fever and infection in patients with certain types of cancer who are receiving chemotherapy that affects the bone marrow, and to increase survival after being exposed to high doses of radiation. This medicine may be used for other purposes; ask your health care provider or pharmacist if you have questions. What should I tell my health care provider before I take this medicine? They need to know if you have any of these conditions: -kidney disease -latex allergy -ongoing radiation therapy -sickle cell disease -skin reactions to acrylic adhesives (On-Body Injector only) -an unusual or allergic reaction to pegfilgrastim, filgrastim, other medicines, foods, dyes, or preservatives -pregnant or trying to get pregnant -breast-feeding How should I use this medicine? This medicine is for injection under the skin. If you get this medicine at home, you will be taught how to prepare and give the pre-filled syringe or how to  use the On-body Injector. Refer to the patient Instructions for Use for detailed instructions. Use exactly as directed. Take your medicine at regular intervals. Do not take your medicine more often than directed. It is important that you put your used needles and syringes in a special sharps container. Do not put them in a trash can. If you do not have a sharps container, call your pharmacist or healthcare provider to get one. Talk to your pediatrician regarding the use of this medicine in children. While this drug may be prescribed for selected conditions, precautions do apply. Overdosage: If you think you have taken too much of this  medicine contact a poison control center or emergency room at once. NOTE: This medicine is only for you. Do not share this medicine with others. What if I miss a dose? It is important not to miss your dose. Call your doctor or health care professional if you miss your dose. If you miss a dose due to an On-body Injector failure or leakage, a new dose should be administered as soon as possible using a single prefilled syringe for manual use. What may interact with this medicine? Interactions have not been studied. Give your health care provider a list of all the medicines, herbs, non-prescription drugs, or dietary supplements you use. Also tell them if you smoke, drink alcohol, or use illegal drugs. Some items may interact with your medicine. This list may not describe all possible interactions. Give your health care provider a list of all the medicines, herbs, non-prescription drugs, or dietary supplements you use. Also tell them if you smoke, drink alcohol, or use illegal drugs. Some items may interact with your medicine. What should I watch for while using this medicine? You may need blood work done while you are taking this medicine. If you are going to need a MRI, CT scan, or other procedure, tell your doctor that you are using this medicine (On-Body Injector only). What side effects may I notice from receiving this medicine? Side effects that you should report to your doctor or health care professional as soon as possible: -allergic reactions like skin rash, itching or hives, swelling of the face, lips, or tongue -dizziness -fever -pain, redness, or irritation at site where injected -pinpoint red spots on the skin -red or dark-brown urine -shortness of breath or breathing problems -stomach or side pain, or pain at the shoulder -swelling -tiredness -trouble passing urine or change in the amount of urine Side effects that usually do not require medical attention (report to your doctor or  health care professional if they continue or are bothersome): -bone pain -muscle pain This list may not describe all possible side effects. Call your doctor for medical advice about side effects. You may report side effects to FDA at 1-800-FDA-1088. Where should I keep my medicine? Keep out of the reach of children. Store pre-filled syringes in a refrigerator between 2 and 8 degrees C (36 and 46 degrees F). Do not freeze. Keep in carton to protect from light. Throw away this medicine if it is left out of the refrigerator for more than 48 hours. Throw away any unused medicine after the expiration date. NOTE: This sheet is a summary. It may not cover all possible information. If you have questions about this medicine, talk to your doctor, pharmacist, or health care provider.    2016, [employer]. (2014-07-22 14:30:14)

## 2015-06-01 ENCOUNTER — Ambulatory Visit (HOSPITAL_COMMUNITY)
Admission: RE | Admit: 2015-06-01 | Discharge: 2015-06-01 | Disposition: A | Discharge status: Home / Self Care | Payer: Medicaid Other | Source: Ambulatory Visit | Attending: Oncology | Admitting: Oncology

## 2015-06-01 ENCOUNTER — Other Ambulatory Visit: Payer: Self-pay

## 2015-06-01 ENCOUNTER — Encounter (HOSPITAL_COMMUNITY): Payer: Self-pay | Admitting: Emergency Medicine

## 2015-06-01 ENCOUNTER — Emergency Department (HOSPITAL_COMMUNITY): Payer: Medicaid Other

## 2015-06-01 ENCOUNTER — Inpatient Hospital Stay (HOSPITAL_COMMUNITY)
Admission: EM | Admit: 2015-06-01 | Discharge: 2015-06-08 | DRG: 190 | Disposition: A | Discharge status: Home Health | Payer: Medicaid Other | Attending: Internal Medicine | Admitting: Internal Medicine

## 2015-06-01 ENCOUNTER — Other Ambulatory Visit (HOSPITAL_COMMUNITY): Payer: Self-pay | Admitting: Oncology

## 2015-06-01 ENCOUNTER — Encounter (HOSPITAL_BASED_OUTPATIENT_CLINIC_OR_DEPARTMENT_OTHER): Payer: Medicaid Other

## 2015-06-01 VITALS — Temp 98.0°F

## 2015-06-01 DIAGNOSIS — J449 Chronic obstructive pulmonary disease, unspecified: Secondary | ICD-10-CM | POA: Diagnosis present

## 2015-06-01 DIAGNOSIS — J910 Malignant pleural effusion: Secondary | ICD-10-CM | POA: Diagnosis present

## 2015-06-01 DIAGNOSIS — Z515 Encounter for palliative care: Secondary | ICD-10-CM | POA: Diagnosis not present

## 2015-06-01 DIAGNOSIS — Z5189 Encounter for other specified aftercare: Secondary | ICD-10-CM | POA: Diagnosis not present

## 2015-06-01 DIAGNOSIS — K838 Other specified diseases of biliary tract: Secondary | ICD-10-CM

## 2015-06-01 DIAGNOSIS — E43 Unspecified severe protein-calorie malnutrition: Secondary | ICD-10-CM | POA: Diagnosis present

## 2015-06-01 DIAGNOSIS — C7802 Secondary malignant neoplasm of left lung: Secondary | ICD-10-CM | POA: Diagnosis present

## 2015-06-01 DIAGNOSIS — R0600 Dyspnea, unspecified: Secondary | ICD-10-CM | POA: Diagnosis present

## 2015-06-01 DIAGNOSIS — C801 Malignant (primary) neoplasm, unspecified: Secondary | ICD-10-CM

## 2015-06-01 DIAGNOSIS — J9621 Acute and chronic respiratory failure with hypoxia: Secondary | ICD-10-CM | POA: Diagnosis present

## 2015-06-01 DIAGNOSIS — J9601 Acute respiratory failure with hypoxia: Secondary | ICD-10-CM | POA: Diagnosis not present

## 2015-06-01 DIAGNOSIS — Z87891 Personal history of nicotine dependence: Secondary | ICD-10-CM

## 2015-06-01 DIAGNOSIS — D638 Anemia in other chronic diseases classified elsewhere: Secondary | ICD-10-CM | POA: Diagnosis present

## 2015-06-01 DIAGNOSIS — J189 Pneumonia, unspecified organism: Secondary | ICD-10-CM | POA: Diagnosis present

## 2015-06-01 DIAGNOSIS — Z66 Do not resuscitate: Secondary | ICD-10-CM | POA: Diagnosis present

## 2015-06-01 DIAGNOSIS — Z5111 Encounter for antineoplastic chemotherapy: Secondary | ICD-10-CM | POA: Diagnosis present

## 2015-06-01 DIAGNOSIS — J948 Other specified pleural conditions: Secondary | ICD-10-CM | POA: Diagnosis not present

## 2015-06-01 DIAGNOSIS — E440 Moderate protein-calorie malnutrition: Secondary | ICD-10-CM | POA: Diagnosis present

## 2015-06-01 DIAGNOSIS — F419 Anxiety disorder, unspecified: Secondary | ICD-10-CM | POA: Diagnosis present

## 2015-06-01 DIAGNOSIS — C799 Secondary malignant neoplasm of unspecified site: Secondary | ICD-10-CM | POA: Insufficient documentation

## 2015-06-01 DIAGNOSIS — R0602 Shortness of breath: Secondary | ICD-10-CM | POA: Diagnosis not present

## 2015-06-01 DIAGNOSIS — J440 Chronic obstructive pulmonary disease with acute lower respiratory infection: Secondary | ICD-10-CM | POA: Diagnosis present

## 2015-06-01 DIAGNOSIS — Y95 Nosocomial condition: Secondary | ICD-10-CM | POA: Diagnosis present

## 2015-06-01 DIAGNOSIS — Z7189 Other specified counseling: Secondary | ICD-10-CM | POA: Insufficient documentation

## 2015-06-01 DIAGNOSIS — Z833 Family history of diabetes mellitus: Secondary | ICD-10-CM | POA: Diagnosis not present

## 2015-06-01 DIAGNOSIS — J439 Emphysema, unspecified: Secondary | ICD-10-CM | POA: Diagnosis not present

## 2015-06-01 DIAGNOSIS — Z9981 Dependence on supplemental oxygen: Secondary | ICD-10-CM

## 2015-06-01 DIAGNOSIS — J90 Pleural effusion, not elsewhere classified: Secondary | ICD-10-CM | POA: Insufficient documentation

## 2015-06-01 DIAGNOSIS — R0902 Hypoxemia: Secondary | ICD-10-CM

## 2015-06-01 HISTORY — DX: Pleural effusion, not elsewhere classified: J90

## 2015-06-01 HISTORY — DX: Dependence on supplemental oxygen: Z99.81

## 2015-06-01 LAB — CBC WITH DIFFERENTIAL/PLATELET
Basophils Absolute: 0 10*3/uL (ref 0.0–0.1)
Basophils Relative: 0 %
EOS ABS: 0.1 10*3/uL (ref 0.0–0.7)
EOS PCT: 1 %
HCT: 31.6 % — ABNORMAL LOW (ref 39.0–52.0)
Hemoglobin: 10.1 g/dL — ABNORMAL LOW (ref 13.0–17.0)
LYMPHS ABS: 1.4 10*3/uL (ref 0.7–4.0)
Lymphocytes Relative: 9 %
MCH: 26.4 pg (ref 26.0–34.0)
MCHC: 32 g/dL (ref 30.0–36.0)
MCV: 82.7 fL (ref 78.0–100.0)
MONO ABS: 1.1 10*3/uL — AB (ref 0.1–1.0)
MONOS PCT: 7 %
Neutro Abs: 12.6 10*3/uL — ABNORMAL HIGH (ref 1.7–7.7)
Neutrophils Relative %: 83 %
PLATELETS: 866 10*3/uL — AB (ref 150–400)
RBC: 3.82 MIL/uL — ABNORMAL LOW (ref 4.22–5.81)
RDW: 14.4 % (ref 11.5–15.5)
WBC: 15.3 10*3/uL — AB (ref 4.0–10.5)

## 2015-06-01 LAB — BASIC METABOLIC PANEL
Anion gap: 11 (ref 5–15)
BUN: 13 mg/dL (ref 6–20)
CO2: 29 mmol/L (ref 22–32)
Calcium: 9.3 mg/dL (ref 8.9–10.3)
Chloride: 95 mmol/L — ABNORMAL LOW (ref 101–111)
Creatinine, Ser: 0.84 mg/dL (ref 0.61–1.24)
GFR calc Af Amer: >60 mL/min (ref >60)
GFR calc non Af Amer: >60 mL/min (ref >60)
Glucose, Bld: 94 mg/dL (ref 65–99)
Potassium: 4.6 mmol/L (ref 3.5–5.1)
Sodium: 135 mmol/L (ref 135–145)

## 2015-06-01 LAB — LACTIC ACID, PLASMA
Lactic Acid, Venous: 1.3 mmol/L (ref 0.5–2.0)
Lactic Acid, Venous: 1.6 mmol/L (ref 0.5–2.0)

## 2015-06-01 LAB — URINALYSIS, ROUTINE W REFLEX MICROSCOPIC
BILIRUBIN URINE: NEGATIVE
Glucose, UA: NEGATIVE mg/dL
HGB URINE DIPSTICK: NEGATIVE
Ketones, ur: NEGATIVE mg/dL
Leukocytes, UA: NEGATIVE
Nitrite: NEGATIVE
PROTEIN: NEGATIVE mg/dL
Specific Gravity, Urine: 1.02 (ref 1.005–1.030)
pH: 6 (ref 5.0–8.0)

## 2015-06-01 LAB — TROPONIN I: Troponin I: <0.03 ng/mL (ref <0.031)

## 2015-06-01 LAB — BRAIN NATRIURETIC PEPTIDE: B Natriuretic Peptide: 62 pg/mL (ref 0.0–100.0)

## 2015-06-01 MED ORDER — ALBUTEROL SULFATE (2.5 MG/3ML) 0.083% IN NEBU: 2.5000 mg | INHALATION_SOLUTION | Freq: Four times a day (QID) | RESPIRATORY_TRACT | Status: DC | PRN

## 2015-06-01 MED ORDER — PROCHLORPERAZINE MALEATE 5 MG PO TABS: 10.0000 mg | ORAL_TABLET | Freq: Four times a day (QID) | ORAL | Status: DC | PRN

## 2015-06-01 MED ORDER — PIPERACILLIN-TAZOBACTAM 3.375 G IVPB
3.3750 g | Freq: Three times a day (TID) | INTRAVENOUS | Status: DC
  Administered 2015-06-02 – 2015-06-08 (×20): 3.375 g via INTRAVENOUS
  Filled 2015-06-01 (×26): qty 50

## 2015-06-01 MED ORDER — IOHEXOL 350 MG/ML SOLN
100.0000 mL | Freq: Once | INTRAVENOUS | Status: AC | PRN
  Administered 2015-06-01: 100 mL via INTRAVENOUS

## 2015-06-01 MED ORDER — LIDOCAINE-PRILOCAINE 2.5-2.5 % EX CREA: TOPICAL_CREAM | CUTANEOUS | Status: AC

## 2015-06-01 MED ORDER — PIPERACILLIN-TAZOBACTAM 3.375 G IVPB 30 MIN
3.3750 g | Freq: Three times a day (TID) | INTRAVENOUS | Status: DC
  Administered 2015-06-01: 3.375 g via INTRAVENOUS
  Filled 2015-06-01: qty 50

## 2015-06-01 MED ORDER — ALBUTEROL SULFATE HFA 108 (90 BASE) MCG/ACT IN AERS
2.0000 | INHALATION_SPRAY | Freq: Four times a day (QID) | RESPIRATORY_TRACT | Status: DC | PRN
  Filled 2015-06-01: qty 6.7

## 2015-06-01 MED ORDER — VANCOMYCIN HCL 500 MG IV SOLR
500.0000 mg | Freq: Three times a day (TID) | INTRAVENOUS | Status: DC
  Administered 2015-06-02 – 2015-06-04 (×9): 500 mg via INTRAVENOUS
  Filled 2015-06-01 (×14): qty 500

## 2015-06-01 MED ORDER — FLUCONAZOLE 100 MG PO TABS
100.0000 mg | ORAL_TABLET | Freq: Every day | ORAL | Status: DC
  Administered 2015-06-01 – 2015-06-08 (×8): 100 mg via ORAL
  Filled 2015-06-01 (×9): qty 1

## 2015-06-01 MED ORDER — VANCOMYCIN HCL 10 G IV SOLR
1250.0000 mg | Freq: Once | INTRAVENOUS | Status: AC
  Administered 2015-06-01: 1250 mg via INTRAVENOUS
  Filled 2015-06-01: qty 1250

## 2015-06-01 MED ORDER — DEXAMETHASONE 4 MG PO TABS: ORAL_TABLET | ORAL | Status: AC

## 2015-06-01 MED ORDER — ALBUTEROL SULFATE (2.5 MG/3ML) 0.083% IN NEBU
2.5000 mg | INHALATION_SOLUTION | RESPIRATORY_TRACT | Status: AC
  Administered 2015-06-01 (×3): 2.5 mg via RESPIRATORY_TRACT
  Filled 2015-06-01 (×3): qty 3

## 2015-06-01 MED ORDER — MOMETASONE FURO-FORMOTEROL FUM 100-5 MCG/ACT IN AERO
2.0000 | INHALATION_SPRAY | Freq: Two times a day (BID) | RESPIRATORY_TRACT | Status: DC
  Administered 2015-06-01 – 2015-06-08 (×14): 2 via RESPIRATORY_TRACT
  Filled 2015-06-01: qty 8.8

## 2015-06-01 MED ORDER — IPRATROPIUM-ALBUTEROL 0.5-2.5 (3) MG/3ML IN SOLN
3.0000 mL | Freq: Once | RESPIRATORY_TRACT | Status: AC
  Administered 2015-06-01: 3 mL via RESPIRATORY_TRACT
  Filled 2015-06-01: qty 3

## 2015-06-01 MED ORDER — OXYCODONE-ACETAMINOPHEN 5-325 MG PO TABS
1.0000 | ORAL_TABLET | ORAL | Status: DC | PRN
  Administered 2015-06-01 – 2015-06-05 (×16): 2 via ORAL
  Filled 2015-06-01 (×17): qty 2

## 2015-06-01 MED ORDER — DEXAMETHASONE 6 MG PO TABS
20.0000 mg | ORAL_TABLET | Freq: Two times a day (BID) | ORAL | Status: DC
  Filled 2015-06-01 (×3): qty 1

## 2015-06-01 MED ORDER — ONDANSETRON HCL 8 MG PO TABS: 8.0000 mg | ORAL_TABLET | Freq: Three times a day (TID) | ORAL | Status: AC | PRN

## 2015-06-01 MED ORDER — ENSURE ENLIVE PO LIQD
237.0000 mL | Freq: Two times a day (BID) | ORAL | Status: DC
  Administered 2015-06-02 – 2015-06-08 (×9): 237 mL via ORAL

## 2015-06-01 MED ORDER — ENOXAPARIN SODIUM 40 MG/0.4ML ~~LOC~~ SOLN
40.0000 mg | SUBCUTANEOUS | Status: DC
  Administered 2015-06-01 – 2015-06-07 (×7): 40 mg via SUBCUTANEOUS
  Filled 2015-06-01 (×7): qty 0.4

## 2015-06-01 MED ORDER — PROCHLORPERAZINE MALEATE 10 MG PO TABS: 10.0000 mg | ORAL_TABLET | Freq: Four times a day (QID) | ORAL | Status: DC | PRN

## 2015-06-01 MED ORDER — ALBUTEROL SULFATE (2.5 MG/3ML) 0.083% IN NEBU
2.5000 mg | INHALATION_SOLUTION | Freq: Once | RESPIRATORY_TRACT | Status: AC
  Administered 2015-06-01: 2.5 mg via RESPIRATORY_TRACT
  Filled 2015-06-01: qty 3

## 2015-06-01 NOTE — ED Notes (Signed)
During orthostatic vs, pt was complaining of dizziness while standing could not hold balance. Pt stated he was not able to ambulate at this time.

## 2015-06-01 NOTE — ED Provider Notes (Signed)
CSN: [id number]     Arrival date & time 06/01/15  1136 History   First MD Initiated Contact with Patient 06/01/15 1210     Chief Complaint  Patient presents with  . Shortness of Breath  . Weakness     HPI Pt was seen at 1215.  Per pt and his family, c/o gradual onset and worsening of persistent SOB and cough for the past several weeks. Has been associated with generalized weakness. Pt states he has been wearing his home O2 without improvement. Pt was evaluated at his Oncologist office PTA, then sent to the ED for further evaluation/admission for hypoxia (O2 Sats 79% R/A). Pt is due to start chemotherapy this week. Denies CP/palpitations, no fevers, no abd pain, no N/V/D.     Past Medical History  Diagnosis Date  . Tobacco use disorder 04/23/2015    Quit Fall 2016  . ETOH abuse     Quit Fall 2016  . Illicit drug use     Quit Fall 2016  . On home O2   . 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 Pleural effusion on right    Past Surgical History  Procedure Laterality Date  . Video bronchoscopy Bilateral 04/27/2015    Procedure: VIDEO BRONCHOSCOPY WITH FLUORO;  Surgeon: Juanito Doom, MD;  Location: [facility];  Service: Cardiopulmonary;  Laterality: Bilateral;  . Hernia repair Right 2009    [place]  [facility]  . Colonoscopy with propofol N/A 05/06/2015    Procedure: COLONOSCOPY WITH PROPOFOL; IN CECUM AT 1159; WITHDRAWAL TIME 23 MINUTES;  Surgeon: Danie Binder, MD;  Location: [facility];  Service: Endoscopy;  Laterality: N/A;  . Esophagogastroduodenoscopy (egd) with propofol N/A 05/06/2015    Procedure: ESOPHAGOGASTRODUODENOSCOPY (EGD) WITH PROPOFOL;  Surgeon: Danie Binder, MD;  Location: [facility];  Service: Endoscopy;  Laterality: N/A;  . Portacath placement Right 05/11/2015    Procedure: INSERTION PORT-A-CATH;  Surgeon: Aviva Signs, MD;  Location: [facility];  Service: General;  Laterality:  Right;  . Plurex catheter Right    Family History  Problem Relation Age of Onset  . Diabetes Mother    Social History  Substance Use Topics  . Smoking status: Former Smoker -- 2.00 packs/day for 40 years    Types: Cigarettes    Quit date: 04/22/2015  . Smokeless tobacco: Never Used  . Alcohol Use: 3.6 oz/week    6 Cans of beer per week     Comment: Quit in fall 2016    Review of Systems ROS: Statement: All systems negative except as marked or noted in the HPI; Constitutional: Negative for fever and chills. +generalized weakness; ; Eyes: Negative for eye pain, redness and discharge. ; ; ENMT: Negative for ear pain, hoarseness, nasal congestion, sinus pressure and sore throat. ; ; Cardiovascular: Negative for chest pain, palpitations, diaphoresis, and peripheral edema. ; ; Respiratory: +SOB, cough. Negative for wheezing and stridor. ; ; Gastrointestinal: Negative for nausea, vomiting, diarrhea, abdominal pain, blood in stool, hematemesis, jaundice and rectal bleeding. . ; ; Genitourinary: Negative for dysuria, flank pain and hematuria. ; ; Musculoskeletal: Negative for back pain and neck pain. Negative for swelling and trauma.; ; Skin: Negative for pruritus, rash, abrasions, blisters, bruising and skin lesion.; ; Neuro: Negative for headache, lightheadedness and neck stiffness. Negative for altered level of consciousness , altered mental status, extremity weakness, paresthesias, involuntary movement, seizure and syncope.      Allergies  Review of patient's allergies indicates no known allergies.  Home Medications   Prior to Admission medications   Medication Sig Start Date End Date Taking? Authorizing Provider  albuterol (PROVENTIL HFA;VENTOLIN HFA) 108 (90 BASE) MCG/ACT inhaler Inhale 2 puffs into the lungs every 6 (six) hours as needed for wheezing or shortness of breath. 05/26/15  Yes Manon Hilding Kefalas, PA-C  dexamethasone (DECADRON) 4 MG tablet The day before chemo take 5 tabs in the  am and 5 tabs in the pm. The morning of chemo take 5 tabs prior to chemo appt. 06/01/15  Yes Patrici Ranks, MD  fluconazole (DIFLUCAN) 100 MG tablet Take 1 tablet (100 mg total) by mouth daily. 05/19/15  Yes Patrici Ranks, MD  Fluticasone-Salmeterol (ADVAIR DISKUS) 250-50 MCG/DOSE AEPB Inhale 1 puff into the lungs 2 (two) times daily. 05/14/15  Yes Sinda Du, MD  ondansetron (ZOFRAN) 8 MG tablet Take 1 tablet (8 mg total) by mouth every 8 (eight) hours as needed for nausea or vomiting. 06/01/15  Yes Patrici Ranks, MD  oxyCODONE-acetaminophen (PERCOCET/ROXICET) 5-325 MG tablet Take 1-2 tablets by mouth every 4 (four) hours as needed for moderate pain or severe pain. 05/26/15  Yes Baird Cancer, PA-C  prochlorperazine (COMPAZINE) 10 MG tablet Take 1 tablet (10 mg total) by mouth every 6 (six) hours as needed for nausea or vomiting. 06/01/15  Yes Patrici Ranks, MD  CARBOPLATIN IV Inject into the vein. Every 21 days    Historical Provider, MD  lidocaine-prilocaine (EMLA) cream Apply a quarter size amount to port site 1 hour prior to chemo. Do not rub in. Cover with plastic wrap. 06/01/15   Patrici Ranks, MD  PACLitaxel (TAXOL IV) Inject into the vein. Every 21 days    Historical Provider, MD  Pegfilgrastim (NEULASTA DELIVERY KIT [place]) Inject into the skin. Every 21 days    Historical Provider, MD   BP 126/73 mmHg  Pulse 96  Temp(Src) 98.6 F (37 C) (Oral)  Resp 18  SpO2 99% Physical Exam  1220: Physical examination:  Nursing notes reviewed; Vital signs and O2 SAT reviewed;  Constitutional: Well developed, Well nourished, Well hydrated, In no acute distress; Head:  Normocephalic, atraumatic; Eyes: EOMI, PERRL, No scleral icterus; ENMT: Mouth and pharynx normal, Mucous membranes moist; Neck: Supple, Full range of motion, No lymphadenopathy; Cardiovascular: Regular rate and rhythm, No gallop; Respiratory: Breath sounds diminished & equal bilaterally, No wheezes.  Speaking full  sentences with ease, Normal respiratory effort/excursion; Chest: Nontender, Movement normal; Abdomen: Soft, Nontender, Nondistended, Normal bowel sounds; Genitourinary: No CVA tenderness; Extremities: Pulses normal, No tenderness, No edema, No calf edema or asymmetry.; Neuro: AA&Ox3, Major CN grossly intact.  Speech clear. No gross focal motor or sensory deficits in extremities.; Skin: Color normal, Warm, Dry.   ED Course  Procedures (including critical care time) Labs Review   Imaging Review  I have personally reviewed and evaluated these images and lab results as part of my medical decision-making.   EKG Interpretation   Date/Time:  Wednesday June 01 2015 12:33:54 EST Ventricular Rate:  83 PR Interval:  114 QRS Duration: 93 QT Interval:  383 QTC Calculation: 450 R Axis:   84 Text Interpretation:  Sinus rhythm Borderline short PR interval Probable  left ventricular hypertrophy Lateral infarct, old Baseline wander Artifact  When compared with ECG of 05/08/2015 No significant change was found  Confirmed by [facility]  MD, Nunzio Cory [phone]) on 06/01/2015 1:01:39 PM      MDM  MDM Reviewed:  previous chart, nursing note and vitals Reviewed previous: labs and ECG Interpretation: labs, ECG, x-ray and CT scan      Results for orders placed or performed during the hospital encounter of 36/62/94  Basic metabolic panel  Result Value Ref Range   Sodium 135 135 - 145 mmol/L   Potassium 4.6 3.5 - 5.1 mmol/L   Chloride 95 (L) 101 - 111 mmol/L   CO2 29 22 - 32 mmol/L   Glucose, Bld 94 65 - 99 mg/dL   BUN 13 6 - 20 mg/dL   Creatinine, Ser 0.84 0.61 - 1.24 mg/dL   Calcium 9.3 8.9 - 10.3 mg/dL   GFR calc non Af Amer >60 >60 mL/min   GFR calc Af Amer >60 >60 mL/min   Anion gap 11 5 - 15  Brain natriuretic peptide  Result Value Ref Range   B Natriuretic Peptide 62.0 0.0 - 100.0 pg/mL  Troponin I  Result Value Ref Range   Troponin I <0.03 <0.031 ng/mL  Lactic acid, plasma  Result  Value Ref Range   Lactic Acid, Venous 1.3 0.5 - 2.0 mmol/L  CBC with Differential  Result Value Ref Range   WBC 15.3 (H) 4.0 - 10.5 K/uL   RBC 3.82 (L) 4.22 - 5.81 MIL/uL   Hemoglobin 10.1 (L) 13.0 - 17.0 g/dL   HCT 31.6 (L) 39.0 - 52.0 %   MCV 82.7 78.0 - 100.0 fL   MCH 26.4 26.0 - 34.0 pg   MCHC 32.0 30.0 - 36.0 g/dL   RDW 14.4 11.5 - 15.5 %   Platelets 866 (H) 150 - 400 K/uL   Neutrophils Relative % 83 %   Neutro Abs 12.6 (H) 1.7 - 7.7 K/uL   Lymphocytes Relative 9 %   Lymphs Abs 1.4 0.7 - 4.0 K/uL   Monocytes Relative 7 %   Monocytes Absolute 1.1 (H) 0.1 - 1.0 K/uL   Eosinophils Relative 1 %   Eosinophils Absolute 0.1 0.0 - 0.7 K/uL   Basophils Relative 0 %   Basophils Absolute 0.0 0.0 - 0.1 K/uL  Urinalysis, Routine w reflex microscopic  Result Value Ref Range   Color, Urine YELLOW YELLOW   APPearance CLEAR CLEAR   Specific Gravity, Urine 1.020 1.005 - 1.030   pH 6.0 5.0 - 8.0   Glucose, UA NEGATIVE NEGATIVE mg/dL   Hgb urine dipstick NEGATIVE NEGATIVE   Bilirubin Urine NEGATIVE NEGATIVE   Ketones, ur NEGATIVE NEGATIVE mg/dL   Protein, ur NEGATIVE NEGATIVE mg/dL   Nitrite NEGATIVE NEGATIVE   Leukocytes, UA NEGATIVE NEGATIVE    Dg Chest 2 View 06/01/2015  CLINICAL DATA:  Stage IV adenocarcinoma of unknown primary, pleural effusion EXAM: CHEST  2 VIEW COMPARISON:  05/11/2015, CT chest 05/08/2015 FINDINGS: RIGHT subclavian Port-A-Cath with tip projecting over SVC. RIGHT basilar thoracostomy tube/PleurX catheter. Stable heart size and mediastinal contours. Persistent enlargement of LEFT hilum secondary to adenopathy. Persistent partially loculated RIGHT pleural effusion with atelectasis and consolidation in RIGHT lung. Increased LEFT lung infiltrate involving upper and lower lobe. Underlying emphysematous changes with large bulla at LEFT upper lobe again identified. No definite pneumothorax. IMPRESSION: COPD changes with increased infiltrate in LEFT lung since previous exam.  This could represent infection though cannot exclude lymphangitic tumor spread. New RIGHT basilar thoracostomy tube/PleurX catheter. Persistent partially loculated RIGHT pleural effusion with atelectasis and infiltrate in RIGHT lung, probably not significantly changed when accounting for differences in technique prior exam. Findings called to Robynn Pane PA on 06/01/2015 at 1012 hours.  Electronically Signed   By: Lavonia Dana M.D.   On: 06/01/2015 10:14    Ct Angio Chest Pe W/cm &/or Wo Cm 06/01/2015  CLINICAL DATA:  Short of breath EXAM: CT ANGIOGRAPHY CHEST WITH CONTRAST TECHNIQUE: Multidetector CT imaging of the chest was performed using the standard protocol during bolus administration of intravenous contrast. Multiplanar CT image reconstructions and MIPs were obtained to evaluate the vascular anatomy. CONTRAST:  163mL OMNIPAQUE IOHEXOL 350 MG/ML SOLN COMPARISON:  05/08/2015 FINDINGS: There are no filling defects in the pulmonary arterial tree to suggest acute pulmonary thromboembolism. Loculated right pleural effusion has improved after PleurX drain placement. Extensive consolidation likely due to diffuse infiltration by tumor is not significantly changed throughout the right lung. Aeration in the right lower lobe has improved. Patchy heterogeneous opacities throughout the left lung have increased. A very small left pleural effusion has developed. Mediastinal and hilar adenopathy has subjectively increased. Aorta and great vessels are patent within the confines of the examination. Left innominate vein and SVC remain patent. Airway is patent. There is some compression of the bronchus intermedius. There are some lytic lesions within the bones. For example see image 40 of series 5 within the right side of the sixth thoracic vertebral body. There is no vertebral compression deformity. Review of the MIP images confirms the above findings. IMPRESSION: There is no evidence of pulmonary thromboembolism. Patchy  consolidation versus tumor infiltration throughout the right lung is not significantly changed. Loculated right pleural effusion has improved after PleurX drain placement. Aeration of the right lower lobe has also improved. Very small left pleural effusion has developed. Heterogeneous opacities have increased within the left lung either due to an inflammatory process or endobronchial spread of tumor. New lytic bone lesions worrisome for metastatic disease is suspected. Electronically Signed   By: Marybelle Killings M.D.   On: 06/01/2015 13:52     1520:  No PE on CT scan. Will tx for HCAP. Dx and testing d/w pt and family.  Questions answered.  Verb understanding, agreeable to admit.  T/C to [facility]. Anastasio Champion, case discussed, including:  HPI, pertinent PM/SHx, VS/PE, dx testing, ED course and treatment:  Agreeable to admit, requests to write temporary orders, obtain medical bed to team [facility].      Francine Graven, DO 06/05/15 1920

## 2015-06-01 NOTE — ED Notes (Signed)
Patient from Dr. Donald Pore office due to shortness of breath and generalized weakness. Nurse from office states O2 stat was 79% without O2. With O2 via [place] o2 stat 92%. Patient states weakness and is to start chemo Friday for lung cancer.

## 2015-06-01 NOTE — H&P (Signed)
[facility] History and Physical  Yuepheng Schaller ZOX:[id number] DOB: [birth year] DOA: 06/01/2015  Referring physician: ER PCP: [facility]   Chief Complaint: Dyspnea  HPI: Brian Nelson is a 54 y.o. 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 He has had no cough or fever. He denies any chest pain or palpitations. There is no hemoptysis. Evaluation in the emergency room showed him to desaturate on room air and a CT scan of his chest shows possible new infiltrate in the left lung, possibly indicating metastatic spread of tumor versus infectious process. He is now being admitted for further management.   Review of Systems:  Apart from symptoms above, all systems are negative.  Past Medical History  Diagnosis Date  . Tobacco use disorder 04/23/2015    Quit Fall 2016  . ETOH abuse     Quit Fall 2016  . Illicit drug use     Quit Fall 2016  . On home O2   . 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 Pleural effusion on right    Past Surgical History  Procedure Laterality Date  . Video bronchoscopy Bilateral 04/27/2015    Procedure: VIDEO BRONCHOSCOPY WITH FLUORO;  Surgeon: Juanito Doom, MD;  Location: [facility];  Service: Cardiopulmonary;  Laterality: Bilateral;  . Hernia repair Right 2009    [place]  . Colonoscopy with propofol N/A 05/06/2015    Procedure: COLONOSCOPY WITH PROPOFOL; IN CECUM AT 1159; WITHDRAWAL TIME 23 MINUTES;  Surgeon: Danie Binder, MD;  Location[facility];  Service: Endoscopy;  Laterality: N/A;  . Esophagogastroduodenoscopy (egd) with propofol N/A 05/06/2015    Procedure: ESOPHAGOGASTRODUODENOSCOPY (EGD) WITH PROPOFOL;  Surgeon: Danie Binder, MD;  Location: [facility];  Service:  Endoscopy;  Laterality: N/A;  . Portacath placement Right 05/11/2015    Procedure: INSERTION PORT-A-CATH;  Surgeon: Aviva Signs, MD;  Location: [facility];  Service: General;  Laterality: Right;  . Plurex catheter Right    Social History:  reports that he quit smoking about 5 weeks ago. His smoking use included Cigarettes. He has a 80 pack-year smoking history. He has never used smokeless tobacco. He reports that he drinks about 3.6 oz of alcohol per week. He reports that he uses illicit drugs (Cocaine and Marijuana).  No Known Allergies  Family History  Problem Relation Age of Onset  . Diabetes Mother     Prior to Admission medications   Medication Sig Start Date End Date Taking? Authorizing Provider  albuterol (PROVENTIL HFA;VENTOLIN HFA) 108 (90 BASE) MCG/ACT inhaler Inhale 2 puffs into the lungs every 6 (six) hours as needed for wheezing or shortness of breath. 05/26/15  Yes Manon Hilding Kefalas, PA-C  dexamethasone (DECADRON) 4 MG tablet The day before chemo take 5 tabs in the am and 5 tabs in the pm. The morning of chemo take 5 tabs prior to chemo appt. 06/01/15  Yes Patrici Ranks, MD  fluconazole (DIFLUCAN) 100 MG tablet Take 1 tablet (100 mg total) by mouth daily. 05/19/15  Yes Patrici Ranks, MD  Fluticasone-Salmeterol (ADVAIR DISKUS) 250-50 MCG/DOSE AEPB Inhale 1 puff into the lungs 2 (two) times daily. 05/14/15  Yes Sinda Du, MD  ondansetron (ZOFRAN) 8 MG tablet Take 1 tablet (8 mg total) by mouth every 8 (eight)  hours as needed for nausea or vomiting. 06/01/15  Yes Patrici Ranks, MD  oxyCODONE-acetaminophen (PERCOCET/ROXICET) 5-325 MG tablet Take 1-2 tablets by mouth every 4 (four) hours as needed for moderate pain or severe pain. 05/26/15  Yes Baird Cancer, PA-C  prochlorperazine (COMPAZINE) 10 MG tablet Take 1 tablet (10 mg total) by mouth every 6 (six) hours as needed for nausea or vomiting. 06/01/15  Yes Patrici Ranks, MD  CARBOPLATIN IV Inject into the vein.  Every 21 days    Historical Provider, MD  lidocaine-prilocaine (EMLA) cream Apply a quarter size amount to port site 1 hour prior to chemo. Do not rub in. Cover with plastic wrap. 06/01/15   Patrici Ranks, MD  PACLitaxel (TAXOL IV) Inject into the vein. Every 21 days    Historical Provider, MD  Pegfilgrastim (NEULASTA DELIVERY KIT [place]) Inject into the skin. Every 21 days    Historical Provider, MD   Physical Exam: Filed Vitals:   06/01/15 1148 06/01/15 1240 06/01/15 1300 06/01/15 1500  BP: 127/64  138/71 126/73  Pulse: 85  93 96  Temp: 98.6 F (37 C)     TempSrc: Oral     Resp: 18   18  SpO2: 91% 93% 93% 99%    Wt Readings from Last 3 Encounters:  05/26/15 55.974 kg (123 lb 6.4 oz)  05/19/15 58.695 kg (129 lb 6.4 oz)  05/11/15 59.421 kg (131 lb)    General:  Appears calm and comfortable. He is not toxic or septic limited. He does have clubbing. Eyes: PERRL, normal lids, irises & conjunctiva ENT: grossly normal hearing, lips & tongue Neck: no LAD, masses or thyromegaly Cardiovascular: RRR, no m/r/g. No LE edema. Telemetry: SR, no arrhythmias  Respiratory: Air entry is reduced bilaterally with minimal crackles at the left mid and lower zones. There is no bronchial breathing or wheezing. He does not appear to be in any significant respiratory distress. Abdomen: soft, ntnd Skin: no rash or induration seen on limited exam Musculoskeletal: grossly normal tone BUE/BLE Psychiatric: grossly normal mood and affect, speech fluent and appropriate Neurologic: grossly non-focal.          Labs on Admission:  Basic Metabolic Panel:  Recent Labs Lab 06/01/15 1212  NA 135  K 4.6  CL 95*  CO2 29  GLUCOSE 94  BUN 13  CREATININE 0.84  CALCIUM 9.3   Liver Function Tests: No results for input(s): AST, ALT, ALKPHOS, BILITOT, PROT, ALBUMIN in the last 168 hours. No results for input(s): LIPASE, AMYLASE in the last 168 hours. No results for input(s): AMMONIA in the last 168  hours. CBC:  Recent Labs Lab 06/01/15 1212  WBC 15.3*  NEUTROABS 12.6*  HGB 10.1*  HCT 31.6*  MCV 82.7  PLT 866*   Cardiac Enzymes:  Recent Labs Lab 06/01/15 1212  TROPONINI <0.03    BNP (last 3 results)  Recent Labs  06/01/15 1212  BNP 62.0    ProBNP (last 3 results) No results for input(s): PROBNP in the last 8760 hours.  CBG: No results for input(s): GLUCAP in the last 168 hours.  Radiological Exams on Admission: Dg Chest 2 View  06/01/2015  CLINICAL DATA:  Stage IV adenocarcinoma of unknown primary, pleural effusion EXAM: CHEST  2 VIEW COMPARISON:  05/11/2015, CT chest 05/08/2015 FINDINGS: RIGHT subclavian Port-A-Cath with tip projecting over SVC. RIGHT basilar thoracostomy tube/PleurX catheter. Stable heart size and mediastinal contours. Persistent enlargement of LEFT hilum secondary to adenopathy. Persistent partially loculated RIGHT pleural  effusion with atelectasis and consolidation in RIGHT lung. Increased LEFT lung infiltrate involving upper and lower lobe. Underlying emphysematous changes with large bulla at LEFT upper lobe again identified. No definite pneumothorax. IMPRESSION: COPD changes with increased infiltrate in LEFT lung since previous exam. This could represent infection though cannot exclude lymphangitic tumor spread. New RIGHT basilar thoracostomy tube/PleurX catheter. Persistent partially loculated RIGHT pleural effusion with atelectasis and infiltrate in RIGHT lung, probably not significantly changed when accounting for differences in technique prior exam. Findings called to Robynn Pane PA on 06/01/2015 at 1012 hours. Electronically Signed   By: Lavonia Dana M.D.   On: 06/01/2015 10:14   Ct Angio Chest Pe W/cm &/or Wo Cm  06/01/2015  CLINICAL DATA:  Short of breath EXAM: CT ANGIOGRAPHY CHEST WITH CONTRAST TECHNIQUE: Multidetector CT imaging of the chest was performed using the standard protocol during bolus administration of intravenous contrast.  Multiplanar CT image reconstructions and MIPs were obtained to evaluate the vascular anatomy. CONTRAST:  187m OMNIPAQUE IOHEXOL 350 MG/ML SOLN COMPARISON:  05/08/2015 FINDINGS: There are no filling defects in the pulmonary arterial tree to suggest acute pulmonary thromboembolism. Loculated right pleural effusion has improved after PleurX drain placement. Extensive consolidation likely due to diffuse infiltration by tumor is not significantly changed throughout the right lung. Aeration in the right lower lobe has improved. Patchy heterogeneous opacities throughout the left lung have increased. A very small left pleural effusion has developed. Mediastinal and hilar adenopathy has subjectively increased. Aorta and great vessels are patent within the confines of the examination. Left innominate vein and SVC remain patent. Airway is patent. There is some compression of the bronchus intermedius. There are some lytic lesions within the bones. For example see image 40 of series 5 within the right side of the sixth thoracic vertebral body. There is no vertebral compression deformity. Review of the MIP images confirms the above findings. IMPRESSION: There is no evidence of pulmonary thromboembolism. Patchy consolidation versus tumor infiltration throughout the right lung is not significantly changed. Loculated right pleural effusion has improved after PleurX drain placement. Aeration of the right lower lobe has also improved. Very small left pleural effusion has developed. Heterogeneous opacities have increased within the left lung either due to an inflammatory process or endobronchial spread of tumor. New lytic bone lesions worrisome for metastatic disease is suspected. Electronically Signed   By: AMarybelle KillingsM.D.   On: 06/01/2015 13:52      Assessment/Plan   1. Dyspnea. The etiology is not entirely clear. He does not have palmar embolism. He does have a new infiltrate in the left lung which may indicate spread of  chemotherapy versus infectious process. In addition, he appears to have new lytic bone lesions. He will be treated for possible pneumonia with broad-spectrum antibiotics intravenously. 2. Adenocarcinoma of unknown primary. I will request oncology consultation to address the new findings on CT chest scan and also address prognosis.  He'll be admitted to the medical floor. Further recommendations will depend on patient's hospital progress.  Code Status: Full code for the time being. This needs to be addressed also.  DVT Prophylaxis: Lovenox.  Family Communication: I discussed the plan with the patient at the bedside.   Disposition Plan: Home when medically stable  Time spent: 45 minutes.  GDoree AlbeeTriad Hospitalists Pager [phone]

## 2015-06-01 NOTE — Progress Notes (Signed)
Chemo teaching done for Carboplatin & Taxol. Distress screening and consent to be completed on Friday. Patient wanted to go to ER for shortness of breath so patient was wheeled to triage area of ER and triage nurse took patient and assessed him and got him in a room. Patient's meds transferred to [facility]. Patient scheduled for chemo on Friday 06/03/15.

## 2015-06-01 NOTE — ED Notes (Signed)
MD at bedside. 

## 2015-06-01 NOTE — Progress Notes (Signed)
ANTIBIOTIC CONSULT NOTE - INITIAL  Pharmacy Consult for Vancomycin & Zosyn Indication: pneumonia  No Known Allergies  Patient Measurements:   Adjusted Body Weight:   Vital Signs: Temp: 98.6 F (37 C) (11/16 1148) Temp Source: Oral (11/16 1148) BP: 126/73 mmHg (11/16 1500) Pulse Rate: 96 (11/16 1500) Intake/Output from previous day:   Intake/Output from this shift:    Labs:  Recent Labs  06/01/15 1212  WBC 15.3*  HGB 10.1*  PLT 866*  CREATININE 0.84   Estimated Creatinine Clearance: 81.5 mL/min (by C-G formula based on Cr of 0.84). No results for input(s): VANCOTROUGH, VANCOPEAK, VANCORANDOM, GENTTROUGH, GENTPEAK, GENTRANDOM, TOBRATROUGH, TOBRAPEAK, TOBRARND, AMIKACINPEAK, AMIKACINTROU, AMIKACIN in the last 72 hours.   Microbiology: Recent Results (from the past 720 hour(s))  Culture, blood (routine x 2)     Status: None   Collection Time: 05/08/15  3:45 AM  Result Value Ref Range Status   Specimen Description BLOOD RIGHT ANTECUBITAL  Final   Special Requests BOTTLES DRAWN AEROBIC AND ANAEROBIC 6CC  Final   Culture NO GROWTH 5 DAYS  Final   Report Status 05/13/2015 FINAL  Final  Culture, blood (routine x 2)     Status: None   Collection Time: 05/08/15  3:55 AM  Result Value Ref Range Status   Specimen Description BLOOD RIGHT ARM  Final   Special Requests BOTTLES DRAWN AEROBIC AND ANAEROBIC 6CC  Final   Culture NO GROWTH 5 DAYS  Final   Report Status 05/13/2015 FINAL  Final  Surgical pcr screen     Status: None   Collection Time: 05/11/15  1:05 AM  Result Value Ref Range Status   MRSA, PCR NEGATIVE NEGATIVE Final   Staphylococcus aureus NEGATIVE NEGATIVE Final    Comment:        The Xpert SA Assay (FDA approved for NASAL specimens in patients over 11 years of age), is one component of a comprehensive surveillance program.  Test performance has been validated by [facility] for patients greater than or equal to 109 year old. It is not intended to diagnose  infection nor to guide or monitor treatment.     Medical History: Past Medical History  Diagnosis Date  . Tobacco use disorder 04/23/2015    Quit Fall 2016  . ETOH abuse     Quit Fall 2016  . Illicit drug use     Quit Fall 2016  . On home O2   . 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 Pleural effusion on right     Medications:  Scheduled:  . albuterol  2.5 mg Nebulization Q4H  . enoxaparin (LOVENOX) injection  40 mg Subcutaneous Q24H  . fluconazole  100 mg Oral Daily  . mometasone-formoterol  2 puff Inhalation BID   Assessment:  Dyspnea.The etiology is not entirely clear.Patient does not have palmar embolism. Patient does have a new infiltrate in the left lung which may indicate spread of chemotherapy versus infectious process. In addition, patient appears to have new lytic bone lesions. Will be treated for possible pneumonia with broad-spectrum antibiotics intravenously. Vancomycin 1250 mg IV and Zosyn 3.375 GM IV ordered in ED  Goal of Therapy:  Vancomycin trough level 15-20 mcg/ml  Plan:  Vancomycin 500 mg IV every 8 hours, starting 8 hours after ED dose Zosyn 3.375 GM IV every 8 hours, each dose over 4 hours, starting 8 hours after ED dose Vancomycin trough at steady state Monitor renal function Labs per  protocol  Brian Nelson, Brian Nelson 06/01/2015,4:35 PM

## 2015-06-02 DIAGNOSIS — E43 Unspecified severe protein-calorie malnutrition: Secondary | ICD-10-CM | POA: Diagnosis present

## 2015-06-02 DIAGNOSIS — J189 Pneumonia, unspecified organism: Secondary | ICD-10-CM | POA: Diagnosis present

## 2015-06-02 DIAGNOSIS — J449 Chronic obstructive pulmonary disease, unspecified: Secondary | ICD-10-CM | POA: Diagnosis present

## 2015-06-02 DIAGNOSIS — J9601 Acute respiratory failure with hypoxia: Secondary | ICD-10-CM | POA: Diagnosis present

## 2015-06-02 LAB — COMPREHENSIVE METABOLIC PANEL
ALT: 10 U/L — ABNORMAL LOW (ref 17–63)
AST: 14 U/L — ABNORMAL LOW (ref 15–41)
Albumin: 2.1 g/dL — ABNORMAL LOW (ref 3.5–5.0)
Alkaline Phosphatase: 101 U/L (ref 38–126)
Anion gap: 9 (ref 5–15)
BUN: 11 mg/dL (ref 6–20)
CALCIUM: 9.2 mg/dL (ref 8.9–10.3)
CHLORIDE: 97 mmol/L — AB (ref 101–111)
CO2: 30 mmol/L (ref 22–32)
CREATININE: 0.92 mg/dL (ref 0.61–1.24)
Glucose, Bld: 114 mg/dL — ABNORMAL HIGH (ref 65–99)
Potassium: 4.3 mmol/L (ref 3.5–5.1)
SODIUM: 136 mmol/L (ref 135–145)
Total Bilirubin: 0.4 mg/dL (ref 0.3–1.2)
Total Protein: 6.6 g/dL (ref 6.5–8.1)

## 2015-06-02 LAB — CBC
HCT: 28 % — ABNORMAL LOW (ref 39.0–52.0)
Hemoglobin: 8.8 g/dL — ABNORMAL LOW (ref 13.0–17.0)
MCH: 26.3 pg (ref 26.0–34.0)
MCHC: 31.4 g/dL (ref 30.0–36.0)
MCV: 83.6 fL (ref 78.0–100.0)
PLATELETS: 817 10*3/uL — AB (ref 150–400)
RBC: 3.35 MIL/uL — ABNORMAL LOW (ref 4.22–5.81)
RDW: 14.4 % (ref 11.5–15.5)
WBC: 16.3 10*3/uL — ABNORMAL HIGH (ref 4.0–10.5)

## 2015-06-02 MED ORDER — DEXAMETHASONE 4 MG PO TABS
20.0000 mg | ORAL_TABLET | Freq: Two times a day (BID) | ORAL | Status: DC
  Administered 2015-06-02 (×2): 20 mg via ORAL
  Filled 2015-06-02 (×3): qty 5

## 2015-06-02 MED ORDER — ALBUTEROL SULFATE (2.5 MG/3ML) 0.083% IN NEBU
2.5000 mg | INHALATION_SOLUTION | RESPIRATORY_TRACT | Status: DC
  Administered 2015-06-02 – 2015-06-08 (×39): 2.5 mg via RESPIRATORY_TRACT
  Filled 2015-06-02 (×38): qty 3

## 2015-06-02 MED ORDER — DEXAMETHASONE 6 MG PO TABS: 20.0000 mg | ORAL_TABLET | Freq: Two times a day (BID) | ORAL | Status: DC

## 2015-06-02 NOTE — Progress Notes (Signed)
Patient was on 3LNC, 02 saturations were 78%, after nebulizer treatment and after increasing 02 to [facility] patient still had sats in lower 80's. Placed patient on a 10L high flow nasal cannula, 02 saturations increased to 93%. RT will continue to monitor.

## 2015-06-02 NOTE — Progress Notes (Signed)
[facility] PROGRESS NOTE  Brian Nelson ION:[id number] DOB: 1960-10-26 DOA: 06/01/2015 PCP: [facility]  Assessment/Plan: 1. HCAP, revealed on CT chest. WBC 16.3, he is afebrile. Will continue abx and monitor.  2. Acute respiratory failure with hypoxia. Currently on high flow oxygen. Will try and wean off as tolerated. Will request pulmonology assistance. He does not have evidence of distress at this time. If he his oxygen requirement continues to increase, he may need transfer to SDU. 3. COPD. He does not have any clear wheezing. He is on antibiotics nebs and is receiving steroids. 4. Adenocarcinoma of unknown primary. In addition, he appears to have new lytic bone lesions as well as possible metastatic spread to the left lung seen on CT chest. Oncology consulted to address the new findings on CT chest scan. May need palliative care consult for goals of care, depending on oncology input. 5. Chronic right sided pleural effusion with PleurX drain in place, stable. Will request nursing to perform drainage from catheter  6. Anemia, chronic, likely related to chronic disease. No evidence of bleeding. Continue to follow.  Code Status: Full DVT Prophylaxis: Lovenox. Family Communication: discussed with patient Disposition Plan: discharge home once improved   Consultants:     Procedures:     Antibiotics:   Zosyn 11/16>>  Vancomycin 11/16>>  HPI/Subjective: Feels that breathing is unchanged from yesterday. He has a cough. He is still very short of breath  Objective: Filed Vitals:   06/02/15 0426  BP: 131/64  Pulse: 88  Temp: 97.6 F (36.4 C)  Resp: 18    Intake/Output Summary (Last 24 hours) at 06/02/15 0754 Last data filed at 06/02/15 0700  Gross per 24 hour  Intake    390 ml  Output    300 ml  Net     90 ml   Filed Weights   06/01/15 1813  Weight: 55.5 kg (122 lb 5.7 oz)    Exam:  General: NAD, cachetic, has mild increase work of  breathing Cardiovascular: RRR, S1, S2  Respiratory: diminished breath sounds bilaterally Abdomen: soft, non tender, no distention , bowel sounds normal Musculoskeletal: No edema b/l   Data Reviewed: Basic Metabolic Panel:  Recent Labs Lab 06/01/15 1212 06/02/15 0605  NA 135 136  K 4.6 4.3  CL 95* 97*  CO2 29 30  GLUCOSE 94 114*  BUN 13 11  CREATININE 0.84 0.92  CALCIUM 9.3 9.2   Liver Function Tests:  Recent Labs Lab 06/02/15 0605  AST 14*  ALT 10*  ALKPHOS 101  BILITOT 0.4  PROT 6.6  ALBUMIN 2.1*    CBC:  Recent Labs Lab 06/01/15 1212 06/02/15 0605  WBC 15.3* 16.3*  NEUTROABS 12.6*  --   HGB 10.1* 8.8*  HCT 31.6* 28.0*  MCV 82.7 83.6  PLT 866* 817*   Cardiac Enzymes:  Recent Labs Lab 06/01/15 1212  TROPONINI <0.03   BNP (last 3 results)  Recent Labs  06/01/15 1212  BNP 62.0      Studies: Dg Chest 2 View  06/01/2015  CLINICAL DATA:  Stage IV adenocarcinoma of unknown primary, pleural effusion EXAM: CHEST  2 VIEW COMPARISON:  05/11/2015, CT chest 05/08/2015 FINDINGS: RIGHT subclavian Port-A-Cath with tip projecting over SVC. RIGHT basilar thoracostomy tube/PleurX catheter. Stable heart size and mediastinal contours. Persistent enlargement of LEFT hilum secondary to adenopathy. Persistent partially loculated RIGHT pleural effusion with atelectasis and consolidation in RIGHT lung. Increased LEFT lung infiltrate involving upper and lower lobe. Underlying emphysematous  changes with large bulla at LEFT upper lobe again identified. No definite pneumothorax. IMPRESSION: COPD changes with increased infiltrate in LEFT lung since previous exam. This could represent infection though cannot exclude lymphangitic tumor spread. New RIGHT basilar thoracostomy tube/PleurX catheter. Persistent partially loculated RIGHT pleural effusion with atelectasis and infiltrate in RIGHT lung, probably not significantly changed when accounting for differences in technique prior  exam. Findings called to Robynn Pane PA on 06/01/2015 at 1012 hours. Electronically Signed   By: Lavonia Dana M.D.   On: 06/01/2015 10:14   Ct Angio Chest Pe W/cm &/or Wo Cm  06/01/2015  CLINICAL DATA:  Short of breath EXAM: CT ANGIOGRAPHY CHEST WITH CONTRAST TECHNIQUE: Multidetector CT imaging of the chest was performed using the standard protocol during bolus administration of intravenous contrast. Multiplanar CT image reconstructions and MIPs were obtained to evaluate the vascular anatomy. CONTRAST:  124m OMNIPAQUE IOHEXOL 350 MG/ML SOLN COMPARISON:  05/08/2015 FINDINGS: There are no filling defects in the pulmonary arterial tree to suggest acute pulmonary thromboembolism. Loculated right pleural effusion has improved after PleurX drain placement. Extensive consolidation likely due to diffuse infiltration by tumor is not significantly changed throughout the right lung. Aeration in the right lower lobe has improved. Patchy heterogeneous opacities throughout the left lung have increased. A very small left pleural effusion has developed. Mediastinal and hilar adenopathy has subjectively increased. Aorta and great vessels are patent within the confines of the examination. Left innominate vein and SVC remain patent. Airway is patent. There is some compression of the bronchus intermedius. There are some lytic lesions within the bones. For example see image 40 of series 5 within the right side of the sixth thoracic vertebral body. There is no vertebral compression deformity. Review of the MIP images confirms the above findings. IMPRESSION: There is no evidence of pulmonary thromboembolism. Patchy consolidation versus tumor infiltration throughout the right lung is not significantly changed. Loculated right pleural effusion has improved after PleurX drain placement. Aeration of the right lower lobe has also improved. Very small left pleural effusion has developed. Heterogeneous opacities have increased within the  left lung either due to an inflammatory process or endobronchial spread of tumor. New lytic bone lesions worrisome for metastatic disease is suspected. Electronically Signed   By: AMarybelle KillingsM.D.   On: 06/01/2015 13:52    Scheduled Meds: . albuterol  2.5 mg Nebulization Q4H  . dexamethasone  20 mg Oral BID  . enoxaparin (LOVENOX) injection  40 mg Subcutaneous Q24H  . feeding supplement (ENSURE ENLIVE)  237 mL Oral BID BM  . fluconazole  100 mg Oral Daily  . mometasone-formoterol  2 puff Inhalation BID  . piperacillin-tazobactam (ZOSYN)  IV  3.375 g Intravenous Q8H  . 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 MD [facility] Pager [phone]  If 7PM-7AM, please contact night-coverage at [email or web address], password [email or web address]11/17/2016, 7:54 AM  LOS: 1 day

## 2015-06-02 NOTE — Plan of Care (Signed)
Problem: Education: Goal: Knowledge of [place] [facility] Education information/materials will improve Outcome: Progressing Reviewed pt's plan of care including safety, medications, pain management, etc.   Problem: Respiratory: Goal: Ability to achieve and maintain a regular respiratory rate will improve Outcome: Progressing Pt had to be switched to a high flow nasal cannula at 10 Liters. Goal: Ability to maintain normal oxygenation or baseline will improve Outcome: Progressing Pt oxygenation last documented to be in 98%.

## 2015-06-02 NOTE — Progress Notes (Signed)
Initial Nutrition Assessment  DOCUMENTATION CODES:   Severe malnutrition in context of chronic illness   Pt meets criteria for severe MALNUTRITION in the context of chronic illness as evidenced by unintentional weight loss >5% in 1 month and severe fat mass loss upper arms.  INTERVENTION:  -Ensure Enlive po BID, each supplement provides 350 kcal and 20 grams of protein   -Encourage meal intake  -Dietary staff will obtain meal preferences daily   NUTRITION DIAGNOSIS:   Increased nutrient needs related to cancer and cancer related treatments as evidenced by estimated needs.  GOAL:   Patient will meet greater than or equal to 90% of their needs   MONITOR:   PO intake, Supplement acceptance, Labs, Weight trends  REASON FOR ASSESSMENT:   Malnutrition Screening Tool    ASSESSMENT: Pt has been diagnosed with Stage IV adenocarcinoma.  His appetite has been poor and his weight has severely decreased over the past month (18.7%). He has severe subcutaneous fat loss upper arms and muscle wasting around clavicles and acromion regions. His home diet is regular and he is able to feed himself.He likes the Ensure Imogene and drank 100%.  He is followed by oncology and says he is supposed to be getting chemotherapy tx tomorrow.  Labs: glucose 114, Albumin 2.1, Hemoglobin 8.8/Hematacrit 28%. His WBC's 16.3.    Diet Order:  Diet Heart Room service appropriate?: Yes; Fluid consistency:: Thin  Skin:   dry intact  Last BM:   Prior to admission  Height:   Ht Readings from Last 1 Encounters:  06/01/15 '5\' 8"'$  (1.727 m)    Weight:   Wt Readings from Last 1 Encounters:  06/01/15 122 lb 5.7 oz (55.5 kg)    Ideal Body Weight:  70 kg  BMI:  Body mass index is 18.61 kg/(m^2). borderline underweight  Estimated Nutritional Needs:   Kcal:  1960-2200  Protein:  73-80 gr  Fluid:  1.9-2.2 liters daily  EDUCATION NEEDS:   Education needs addressed (RD provided High protein/High Calorie  diet edu 05/27/15.)  Colman Cater MS,RD,CSG,LDN Office: [phone] Pager: [phone]

## 2015-06-02 NOTE — Discharge Instructions (Signed)
[place] Hospital Stay Proper nutrition can help your body recover from illness and injury.   Foods and beverages high in protein, vitamins, and minerals help rebuild muscle loss, promote healing, & reduce fall risk.   In addition to eating healthy foods, a nutrition shake is an easy, delicious way to get the nutrition you need during and after your hospital stay  It is recommended that you continue to drink 2 bottles per day of:    Ensure Enlive BID for at least 1 month (30 days) after your hospital stay   Tips for adding a nutrition shake into your routine: As allowed, drink one with vitamins or medications instead of water or juice Enjoy one as a tasty mid-morning or afternoon snack Drink cold or make a milkshake out of it Drink one instead of milk with cereal or snacks Use as a coffee creamer   Available at the following grocery stores and pharmacies:           * [place] [phone]            For COUPONS visit: www.[email or web address]/join or [email or web address]   Suggested Substitutions Ensure Plus = Boost Plus = Carnation Breakfast Essentials = Boost Compact Ensure Active Clear = Boost Breeze Glucerna Shake = Boost Glucose Control = Carnation Breakfast Essentials SUGAR FREE

## 2015-06-02 NOTE — Care Management Note (Signed)
Case Management Note  Patient Details  Name: Brian Nelson MRN: [id number] Date of Birth: 09-Oct-1960  Subjective/Objective:                  Pt is from home, lives with his mother and is ind with ADL's. Pt has pleurex catheter in place. Pt's mother reports [facility] RN is in process of ordering more bottles for his catheter. Pt is active with [facility] for RN services. Pt has home O2 through [facility].   Action/Plan: Pt plans to return home with resumption of [place] services at DC. Romualdo Bolk, of [facility], aware of admission and will obtain pt info from chart. Will cont to follow for DC planning needs.   Expected Discharge Date:    06/03/2015              Expected Discharge Plan:  [place]  In-House Referral:  NA  Discharge planning Services  CM Consult  Post Acute Care Choice:  Resumption of Svcs/PTA Provider Choice offered to:  Patient  DME Arranged:    DME Agency:     HH Arranged:  RN [place] Agency:  [place]  Status of Service:  In process, will continue to follow  Medicare Important Message Given:    Date Medicare IM Given:    Medicare IM give by:    Date Additional Medicare IM Given:    Additional Medicare Important Message give by:     If discussed at [place] of Stay Meetings, dates discussed:    Additional Comments:  Sherald Barge, RN 06/02/2015, 11:47 AM

## 2015-06-03 ENCOUNTER — Inpatient Hospital Stay (HOSPITAL_BASED_OUTPATIENT_CLINIC_OR_DEPARTMENT_OTHER): Payer: Medicaid Other

## 2015-06-03 ENCOUNTER — Encounter (HOSPITAL_COMMUNITY): Payer: Self-pay

## 2015-06-03 ENCOUNTER — Telehealth (HOSPITAL_COMMUNITY): Payer: Self-pay | Admitting: *Deleted

## 2015-06-03 VITALS — BP 152/83 | HR 96 | Temp 98.0°F | Resp 18

## 2015-06-03 DIAGNOSIS — Z5111 Encounter for antineoplastic chemotherapy: Secondary | ICD-10-CM

## 2015-06-03 DIAGNOSIS — R0602 Shortness of breath: Secondary | ICD-10-CM

## 2015-06-03 DIAGNOSIS — C801 Malignant (primary) neoplasm, unspecified: Secondary | ICD-10-CM

## 2015-06-03 LAB — BASIC METABOLIC PANEL
ANION GAP: 9 (ref 5–15)
BUN: 13 mg/dL (ref 6–20)
CALCIUM: 9.4 mg/dL (ref 8.9–10.3)
CHLORIDE: 94 mmol/L — AB (ref 101–111)
CO2: 32 mmol/L (ref 22–32)
Creatinine, Ser: 0.85 mg/dL (ref 0.61–1.24)
GFR calc non Af Amer: >60 mL/min (ref >60)
GLUCOSE: 158 mg/dL — AB (ref 65–99)
Potassium: 4.5 mmol/L (ref 3.5–5.1)
Sodium: 135 mmol/L (ref 135–145)

## 2015-06-03 LAB — HEPATIC FUNCTION PANEL
ALBUMIN: 2.3 g/dL — AB (ref 3.5–5.0)
ALT: 10 U/L — ABNORMAL LOW (ref 17–63)
AST: 20 U/L (ref 15–41)
Alkaline Phosphatase: 103 U/L (ref 38–126)
Bilirubin, Direct: <0.1 mg/dL — ABNORMAL LOW (ref 0.1–0.5)
TOTAL PROTEIN: 7 g/dL (ref 6.5–8.1)
Total Bilirubin: 0.2 mg/dL — ABNORMAL LOW (ref 0.3–1.2)

## 2015-06-03 LAB — CBC WITH DIFFERENTIAL/PLATELET
BASOS PCT: 0 %
Basophils Absolute: 0 10*3/uL (ref 0.0–0.1)
EOS ABS: 0 10*3/uL (ref 0.0–0.7)
EOS PCT: 0 %
HCT: 29.2 % — ABNORMAL LOW (ref 39.0–52.0)
Hemoglobin: 9.3 g/dL — ABNORMAL LOW (ref 13.0–17.0)
LYMPHS ABS: 0.5 10*3/uL — AB (ref 0.7–4.0)
LYMPHS PCT: 4 %
MCH: 26.8 pg (ref 26.0–34.0)
MCHC: 31.8 g/dL (ref 30.0–36.0)
MCV: 84.1 fL (ref 78.0–100.0)
MONO ABS: 0.2 10*3/uL (ref 0.1–1.0)
MONOS PCT: 1 %
NEUTROS ABS: 12.5 10*3/uL — AB (ref 1.7–7.7)
NEUTROS PCT: 95 %
PLATELETS: 849 10*3/uL — AB (ref 150–400)
RBC: 3.47 MIL/uL — ABNORMAL LOW (ref 4.22–5.81)
RDW: 14.4 % (ref 11.5–15.5)
WBC: 13.2 10*3/uL — ABNORMAL HIGH (ref 4.0–10.5)

## 2015-06-03 MED ORDER — MORPHINE SULFATE 4 MG/ML IJ SOLN
3.0000 mg | INTRAMUSCULAR | Status: AC
  Filled 2015-06-03: qty 1

## 2015-06-03 MED ORDER — SODIUM CHLORIDE 0.9 % IJ SOLN
10.0000 mL | INTRAMUSCULAR | Status: DC | PRN
  Administered 2015-06-03: 10 mL
  Filled 2015-06-03: qty 10

## 2015-06-03 MED ORDER — MORPHINE SULFATE (PF) 2 MG/ML IV SOLN
INTRAVENOUS | Status: AC
  Filled 2015-06-03: qty 1

## 2015-06-03 MED ORDER — SODIUM CHLORIDE 0.9 % IV SOLN
527.5000 mg | Freq: Once | INTRAVENOUS | Status: AC
  Administered 2015-06-03: 530 mg via INTRAVENOUS
  Filled 2015-06-03: qty 53

## 2015-06-03 MED ORDER — HEPARIN SOD (PORK) LOCK FLUSH 100 UNIT/ML IV SOLN
500.0000 [IU] | Freq: Once | INTRAVENOUS | Status: AC | PRN
  Administered 2015-06-03: 500 [IU]

## 2015-06-03 MED ORDER — PACLITAXEL CHEMO INJECTION 300 MG/50ML
175.0000 mg/m2 | Freq: Once | INTRAVENOUS | Status: AC
  Administered 2015-06-03: 288 mg via INTRAVENOUS
  Filled 2015-06-03: qty 48

## 2015-06-03 MED ORDER — METHYLPREDNISOLONE SODIUM SUCC 40 MG IJ SOLR
40.0000 mg | Freq: Two times a day (BID) | INTRAMUSCULAR | Status: DC
Start: 2015-06-03 — End: 2015-06-08
  Administered 2015-06-03 – 2015-06-08 (×11): 40 mg via INTRAVENOUS
  Filled 2015-06-03 (×11): qty 1

## 2015-06-03 MED ORDER — MORPHINE SULFATE (PF) 2 MG/ML IV SOLN
3.0000 mg | Freq: Once | INTRAVENOUS | Status: AC
  Administered 2015-06-03: 3 mg via INTRAVENOUS

## 2015-06-03 MED ORDER — SODIUM CHLORIDE 0.9 % IV SOLN
Freq: Once | INTRAVENOUS | Status: AC
  Administered 2015-06-03: 11:00:00 via INTRAVENOUS
  Filled 2015-06-03: qty 8

## 2015-06-03 MED ORDER — DIPHENHYDRAMINE HCL 50 MG/ML IJ SOLN
50.0000 mg | Freq: Once | INTRAMUSCULAR | Status: AC
  Administered 2015-06-03: 50 mg via INTRAVENOUS
  Filled 2015-06-03: qty 1

## 2015-06-03 MED ORDER — FAMOTIDINE IN NACL 20-0.9 MG/50ML-% IV SOLN
20.0000 mg | Freq: Once | INTRAVENOUS | Status: AC
  Administered 2015-06-03: 20 mg via INTRAVENOUS
  Filled 2015-06-03: qty 50

## 2015-06-03 MED ORDER — PEGFILGRASTIM 6 MG/0.6ML ~~LOC~~ PSKT: 6.0000 mg | PREFILLED_SYRINGE | Freq: Once | SUBCUTANEOUS | Status: DC

## 2015-06-03 MED ORDER — MORPHINE SULFATE (PF) 2 MG/ML IV SOLN
INTRAVENOUS | Status: AC
  Filled 2015-06-03: qty 2

## 2015-06-03 MED ORDER — FILGRASTIM 300 MCG/ML IJ SOLN
300.0000 ug | Freq: Every day | INTRAMUSCULAR | Status: DC
  Administered 2015-06-04 – 2015-06-07 (×4): 300 ug via SUBCUTANEOUS
  Filled 2015-06-03 (×7): qty 1

## 2015-06-03 MED ORDER — SODIUM CHLORIDE 0.9 % IV SOLN
Freq: Once | INTRAVENOUS | Status: AC
  Administered 2015-06-03: 11:00:00 via INTRAVENOUS

## 2015-06-03 MED ORDER — MORPHINE SULFATE (PF) 2 MG/ML IV SOLN
2.0000 mg | Freq: Once | INTRAVENOUS | Status: AC
  Administered 2015-06-03: 2 mg via INTRAVENOUS
  Filled 2015-06-03: qty 1

## 2015-06-03 MED ORDER — MORPHINE SULFATE 4 MG/ML IJ SOLN: 2.0000 mg | Freq: Once | INTRAMUSCULAR | Status: DC

## 2015-06-03 NOTE — Consult Note (Signed)
[facility] Consultation Oncology  Name: Brian Nelson      MRN: [id number]    Location: A309/A309-01  Date: 06/03/2015 Time:9:12 AM   REFERRING PHYSICIAN:  Hurshel Party, MD  REASON FOR CONSULT:  Adenocarcinoma of unknown primary. New CT chest scan findings. Please address CODE STATUS and prognosis.   DIAGNOSIS:  Adenocarcinoma of unknown primary with CancerTypeID providing an 89% probability of gastroesophageal primary with negative EGD by Dr. Oneida Alar.  HISTORY OF PRESENT ILLNESS:   Brian Nelson is a 54 yo african american man who is well known to the [facility] who has an adenocarcinoma of unknown primary, Stage IV, with cancerType ID providing a 89% probability of gastroesophageal adenocarcinoma.    Adenocarcinoma of unknown primary, CancerType ID showing 89% probability of Gastroesophageal Adenocarcinoma ([place])   04/22/2015 Imaging CT chest- Advanced right upper lobe pneumonia. Extensive empyema at the right lung base. Compressive atelectasis of the right lung. Background pattern of centrilobular emphysema with a 12 cm bulla at the left lung apex. Areas of abnormal density in    04/23/2015 Procedure R thoracentesis   04/23/2015 Pathology Results MALIGNANT CELLS PRESENT, CONSISTENT WITH NON SMALL CELL CARCINOMA.  THE POSITIVE STAINING WITH CDX-2 RAISES THE POSSIBILITY OF A GASTROINTESTINAL PRIMARY   04/27/2015 Imaging CT abd/pelvis- Moderate right pleural effusion with loculation and pleural nodularity suggesting malignant effusion. Atelectasis in the right lung base. Ground-glass nodules in the left lung base, largest 1.1 cm. This is likely metastatic although infl   04/27/2015 Procedure Bronchoscopy by Dr. Lake Bells (Pulmonology)   04/27/2015 Pathology Results BRONCHIAL BRUSHING, RIGHT UPPER LOBE (SPECIMEN 2 OF 2 COLLECTED 04-27-2015) SUSPICIOUS FOR MALIGNANCY.   04/27/2015 Pathology Results BRONCHIAL LAVAGE RIGHT UPPER LOBE (SPECIMEN 1 OF 2 COLLECTED 04/27/2015) MALIGNANT  CELLS PRESENT,   04/28/2015 Imaging MRI brain- Normal MRI appearance of the brain. No evidence for metastatic disease to the brain or meninges. Degenerative changes in the upper cervical spine without definite osseous metastases.   05/03/2015 Imaging CT chest- Persistent large at least partially loculated RIGHT pleural effusion with scattered areas of pleural thickening and enhancement consistent with malignant pleural effusion as identified by prior thoracentesis and cytology. Complete atelect   05/04/2015 Procedure R thoracentesis   05/04/2015 Pathology Results PLEURAL FLUID, RIGHT (SPECIMEN 1 OF 1 COLLECTED 05/03/18): MALIGNANT CELLS PRESENT   05/05/2015 Imaging Bone scan- No evidence of osseous metastatic disease.   05/06/2015 Procedure EGD/Colonoscopy- Negative for findings suggestive of malignancy.  Performed by Dr. Oneida Alar.   05/08/2015 Imaging CT angio chest- No pulmonary embolus. 2. Large partially loculated right pleural effusion with near complete atelectasis and consolidation of the right lung. The pleural effusion has increased in size compared to exam 5 days prior. 3. Advanced emphys   05/11/2015 Procedure Port-A-Cath insertion, by Dr. Arnoldo Morale   05/12/2015 Procedure Right PleurX placed by IR   05/13/2015 Pathology Results CANCERTYPE ID- 10% probability of gastroesophageal adenocarcinoma, cannot exclude pancreaticobiliary (7%) and intestine.     05/13/2015 Pathology Results Cancer types rule out w 95% confidence: Adrenal, brain, GIST, Germ cell, H&N, kidney, HCC, Lung, lymphoma, melanoma, meningioma, mesothelioma, neuroendocrine, prostate, sarcoma, sex cord stromal,squamous cell, basal cell, thymus, thyroid, urinary bladder   05/13/2015 Pathology Results HER2 negative by FISH, equivocal by [facility]   05/17/2015 Pathology Results [facility]- not enough tissue for testing.     He is S/P chemotherapy teaching.  He reported to the ED with SOB.  He does have a PleureX catheter in place and  admits that it is draining ~  75 cc/day.  I personally reviewed and went over radiographic studies with the patient.  The results are noted within this dictation.    I reviewed the recent progression of his disease.  He is educated that he has Stage IV disease and therefore incurable.  He is educated that the goal of treatment is palliative and an attempt at prolonging his life.  The mood of our discussion changed when reviewing this information.  Additionally, I broach CODE STATUS.  He would like to discuss this with his family.  He is quoted a life expectancy without treatment of 2 months or less and TBD with treatment depending on response.   PAST MEDICAL HISTORY:   Past Medical History  Diagnosis Date  . Tobacco use disorder 04/23/2015    Quit Fall 2016  . ETOH abuse     Quit Fall 2016  . Illicit drug use     Quit Fall 2016  . On home O2   . Adenocarcinoma of unknown primary, CancerType ID showing 89% probability of Gastroesophageal Adenocarcinoma ([place])   . Pleural effusion on right     ALLERGIES: No Known Allergies    MEDICATIONS: I have reviewed the patient's current medications.     PAST SURGICAL HISTORY Past Surgical History  Procedure Laterality Date  . Video bronchoscopy Bilateral 04/27/2015    Procedure: VIDEO BRONCHOSCOPY WITH FLUORO;  Surgeon: Juanito Doom, MD;  Location: [place];  Service: Cardiopulmonary;  Laterality: Bilateral;  . Hernia repair Right 2009    [place]  . Colonoscopy with propofol N/A 05/06/2015    Procedure: COLONOSCOPY WITH PROPOFOL; IN CECUM AT 1159; WITHDRAWAL TIME 23 MINUTES;  Surgeon: Danie Binder, MD;  Location: [facility];  Service: Endoscopy;  Laterality: N/A;  . Esophagogastroduodenoscopy (egd) with propofol N/A 05/06/2015    Procedure: ESOPHAGOGASTRODUODENOSCOPY (EGD) WITH PROPOFOL;  Surgeon: Danie Binder, MD;  Location: [facility];  Service: Endoscopy;  Laterality: N/A;  . Portacath placement Right 05/11/2015     Procedure: INSERTION PORT-A-CATH;  Surgeon: Aviva Signs, MD;  Location: [facility];  Service: General;  Laterality: Right;  . Plurex catheter Right     FAMILY HISTORY: Family History  Problem Relation Age of Onset  . Diabetes Mother     SOCIAL HISTORY:  reports that he quit smoking about 6 weeks ago. His smoking use included Cigarettes. He has a 80 pack-year smoking history. He has never used smokeless tobacco. He reports that he drinks about 3.6 oz of alcohol per week. He reports that he uses illicit drugs (Cocaine and Marijuana).  PERFORMANCE STATUS: The patient's performance status is 2 - Symptomatic, <50% confined to bed  PHYSICAL EXAM: Most Recent Vital Signs: Blood pressure 142/79, pulse 84, temperature 97.4 F (36.3 C), temperature source Oral, resp. rate 18, height $RemoveBe'5\' 8"'ydPUartHs$  (1.727 m), weight 122 lb 5.7 oz (55.5 kg), SpO2 98 %. General appearance: alert, cooperative, appears stated age, moderate distress and no family at the bedside. Head: Normocephalic, without obvious abnormality, atraumatic Eyes: positive findings: sclera yellowed Skin: Skin color, texture, turgor normal. No rashes or lesions Neurologic: Grossly normal Cardiac: S1/S2 audible Pulmonary: Decreased throughout R>L, catheter site C/D/I Abdomen: Soft/NT/ND, thin  LABORATORY DATA:  Results for orders placed or performed during the hospital encounter of 06/01/15 (from the past 48 hour(s))  Basic metabolic panel     Status: Abnormal   Collection Time: 06/01/15 12:12 PM  Result Value Ref Range   Sodium 135 135 - 145 mmol/L  Potassium 4.6 3.5 - 5.1 mmol/L   Chloride 95 (L) 101 - 111 mmol/L   CO2 29 22 - 32 mmol/L   Glucose, Bld 94 65 - 99 mg/dL   BUN 13 6 - 20 mg/dL   Creatinine, Ser 0.84 0.61 - 1.24 mg/dL   Calcium 9.3 8.9 - 10.3 mg/dL   GFR calc non Af Amer >60 >60 mL/min   GFR calc Af Amer >60 >60 mL/min    Comment: (NOTE) The eGFR has been calculated using the CKD EPI equation. This calculation has not  been validated in all clinical situations. eGFR's persistently <60 mL/min signify possible Chronic Kidney Disease.    Anion gap 11 5 - 15  Brain natriuretic peptide     Status: None   Collection Time: 06/01/15 12:12 PM  Result Value Ref Range   B Natriuretic Peptide 62.0 0.0 - 100.0 pg/mL  Troponin I     Status: None   Collection Time: 06/01/15 12:12 PM  Result Value Ref Range   Troponin I <0.03 <0.031 ng/mL    Comment:        NO INDICATION OF MYOCARDIAL INJURY.   CBC with Differential     Status: Abnormal   Collection Time: 06/01/15 12:12 PM  Result Value Ref Range   WBC 15.3 (H) 4.0 - 10.5 K/uL   RBC 3.82 (L) 4.22 - 5.81 MIL/uL   Hemoglobin 10.1 (L) 13.0 - 17.0 g/dL   HCT 31.6 (L) 39.0 - 52.0 %   MCV 82.7 78.0 - 100.0 fL   MCH 26.4 26.0 - 34.0 pg   MCHC 32.0 30.0 - 36.0 g/dL   RDW 14.4 11.5 - 15.5 %   Platelets 866 (H) 150 - 400 K/uL   Neutrophils Relative % 83 %   Neutro Abs 12.6 (H) 1.7 - 7.7 K/uL   Lymphocytes Relative 9 %   Lymphs Abs 1.4 0.7 - 4.0 K/uL   Monocytes Relative 7 %   Monocytes Absolute 1.1 (H) 0.1 - 1.0 K/uL   Eosinophils Relative 1 %   Eosinophils Absolute 0.1 0.0 - 0.7 K/uL   Basophils Relative 0 %   Basophils Absolute 0.0 0.0 - 0.1 K/uL  Lactic acid, plasma     Status: None   Collection Time: 06/01/15  1:07 PM  Result Value Ref Range   Lactic Acid, Venous 1.3 0.5 - 2.0 mmol/L  Urinalysis, Routine w reflex microscopic     Status: None   Collection Time: 06/01/15  1:10 PM  Result Value Ref Range   Color, Urine YELLOW YELLOW   APPearance CLEAR CLEAR   Specific Gravity, Urine 1.020 1.005 - 1.030   pH 6.0 5.0 - 8.0   Glucose, UA NEGATIVE NEGATIVE mg/dL   Hgb urine dipstick NEGATIVE NEGATIVE   Bilirubin Urine NEGATIVE NEGATIVE   Ketones, ur NEGATIVE NEGATIVE mg/dL   Protein, ur NEGATIVE NEGATIVE mg/dL   Nitrite NEGATIVE NEGATIVE   Leukocytes, UA NEGATIVE NEGATIVE    Comment: MICROSCOPIC NOT DONE ON URINES WITH NEGATIVE PROTEIN, BLOOD,  LEUKOCYTES, NITRITE, OR GLUCOSE <1000 mg/dL.  Lactic acid, plasma     Status: None   Collection Time: 06/01/15  3:18 PM  Result Value Ref Range   Lactic Acid, Venous 1.6 0.5 - 2.0 mmol/L  CBC     Status: Abnormal   Collection Time: 06/02/15  6:05 AM  Result Value Ref Range   WBC 16.3 (H) 4.0 - 10.5 K/uL   RBC 3.35 (L) 4.22 - 5.81 MIL/uL   Hemoglobin 8.8 (  L) 13.0 - 17.0 g/dL   HCT 28.0 (L) 39.0 - 52.0 %   MCV 83.6 78.0 - 100.0 fL   MCH 26.3 26.0 - 34.0 pg   MCHC 31.4 30.0 - 36.0 g/dL   RDW 14.4 11.5 - 15.5 %   Platelets 817 (H) 150 - 400 K/uL  Comprehensive metabolic panel     Status: Abnormal   Collection Time: 06/02/15  6:05 AM  Result Value Ref Range   Sodium 136 135 - 145 mmol/L   Potassium 4.3 3.5 - 5.1 mmol/L   Chloride 97 (L) 101 - 111 mmol/L   CO2 30 22 - 32 mmol/L   Glucose, Bld 114 (H) 65 - 99 mg/dL   BUN 11 6 - 20 mg/dL   Creatinine, Ser 0.92 0.61 - 1.24 mg/dL   Calcium 9.2 8.9 - 10.3 mg/dL   Total Protein 6.6 6.5 - 8.1 g/dL   Albumin 2.1 (L) 3.5 - 5.0 g/dL   AST 14 (L) 15 - 41 U/L   ALT 10 (L) 17 - 63 U/L   Alkaline Phosphatase 101 38 - 126 U/L   Total Bilirubin 0.4 0.3 - 1.2 mg/dL   GFR calc non Af Amer >60 >60 mL/min   GFR calc Af Amer >60 >60 mL/min    Comment: (NOTE) The eGFR has been calculated using the CKD EPI equation. This calculation has not been validated in all clinical situations. eGFR's persistently <60 mL/min signify possible Chronic Kidney Disease.    Anion gap 9 5 - 15  Basic metabolic panel     Status: Abnormal   Collection Time: 06/03/15  6:12 AM  Result Value Ref Range   Sodium 135 135 - 145 mmol/L   Potassium 4.5 3.5 - 5.1 mmol/L   Chloride 94 (L) 101 - 111 mmol/L   CO2 32 22 - 32 mmol/L   Glucose, Bld 158 (H) 65 - 99 mg/dL   BUN 13 6 - 20 mg/dL   Creatinine, Ser 0.85 0.61 - 1.24 mg/dL   Calcium 9.4 8.9 - 10.3 mg/dL   GFR calc non Af Amer >60 >60 mL/min   GFR calc Af Amer >60 >60 mL/min    Comment: (NOTE) The eGFR has been  calculated using the CKD EPI equation. This calculation has not been validated in all clinical situations. eGFR's persistently <60 mL/min signify possible Chronic Kidney Disease.    Anion gap 9 5 - 15      RADIOGRAPHY: Dg Chest 2 View  06/01/2015  CLINICAL DATA:  Stage IV adenocarcinoma of unknown primary, pleural effusion EXAM: CHEST  2 VIEW COMPARISON:  05/11/2015, CT chest 05/08/2015 FINDINGS: RIGHT subclavian Port-A-Cath with tip projecting over SVC. RIGHT basilar thoracostomy tube/PleurX catheter. Stable heart size and mediastinal contours. Persistent enlargement of LEFT hilum secondary to adenopathy. Persistent partially loculated RIGHT pleural effusion with atelectasis and consolidation in RIGHT lung. Increased LEFT lung infiltrate involving upper and lower lobe. Underlying emphysematous changes with large bulla at LEFT upper lobe again identified. No definite pneumothorax. IMPRESSION: COPD changes with increased infiltrate in LEFT lung since previous exam. This could represent infection though cannot exclude lymphangitic tumor spread. New RIGHT basilar thoracostomy tube/PleurX catheter. Persistent partially loculated RIGHT pleural effusion with atelectasis and infiltrate in RIGHT lung, probably not significantly changed when accounting for differences in technique prior exam. Findings called to Robynn Pane PA on 06/01/2015 at 1012 hours. Electronically Signed   By: Lavonia Dana M.D.   On: 06/01/2015 10:14   Ct Angio Chest Pe  W/cm &/or Wo Cm  06/01/2015  CLINICAL DATA:  Short of breath EXAM: CT ANGIOGRAPHY CHEST WITH CONTRAST TECHNIQUE: Multidetector CT imaging of the chest was performed using the standard protocol during bolus administration of intravenous contrast. Multiplanar CT image reconstructions and MIPs were obtained to evaluate the vascular anatomy. CONTRAST:  1101mL OMNIPAQUE IOHEXOL 350 MG/ML SOLN COMPARISON:  05/08/2015 FINDINGS: There are no filling defects in the pulmonary  arterial tree to suggest acute pulmonary thromboembolism. Loculated right pleural effusion has improved after PleurX drain placement. Extensive consolidation likely due to diffuse infiltration by tumor is not significantly changed throughout the right lung. Aeration in the right lower lobe has improved. Patchy heterogeneous opacities throughout the left lung have increased. A very small left pleural effusion has developed. Mediastinal and hilar adenopathy has subjectively increased. Aorta and great vessels are patent within the confines of the examination. Left innominate vein and SVC remain patent. Airway is patent. There is some compression of the bronchus intermedius. There are some lytic lesions within the bones. For example see image 40 of series 5 within the right side of the sixth thoracic vertebral body. There is no vertebral compression deformity. Review of the MIP images confirms the above findings. IMPRESSION: There is no evidence of pulmonary thromboembolism. Patchy consolidation versus tumor infiltration throughout the right lung is not significantly changed. Loculated right pleural effusion has improved after PleurX drain placement. Aeration of the right lower lobe has also improved. Very small left pleural effusion has developed. Heterogeneous opacities have increased within the left lung either due to an inflammatory process or endobronchial spread of tumor. New lytic bone lesions worrisome for metastatic disease is suspected. Electronically Signed   By: Marybelle Killings M.D.   On: 06/01/2015 13:52       PATHOLOGY:  As previously noted.   ASSESSMENT/PLAN:   Stage IV adenocarcinoma of unknown primary with CancerTypeID demonstrating a probability of 89% of a gastroesophageal primary.  He is S/P chemotherapy teaching on 06/01/2015 with outpatient plans to embark on cycle 1 of Carboplatin/Paclitaxel today (06/03/2015).  Since he is in the hospital, I have ordered STAT CBC diff, CMET with plans on  bringing him to the [place] for his first treatment if lab parameters are met.  Treatment plan is built in [facility]).  He will need 7-10 days of Neupogen 300 mcg.  Order is placed and will begin tomorrow.  Therefore, at time of D/C coordination with [facility] is imperative in order to coordinate continuation of this intervention.  I have discussed his Stage IV diagnosis and the fact that it is incurable.  With that being said, it is treatable and he has a performance status that allows [place] to treat him.  Goal of treatment is purely palliative in nature and to manage symptoms, namely SOB.  Without treatment, I quoted him a life expectancy of 2 months or less.  With treatment, life expectancy would be determined on response to therapy and duration of response.   I broached code status with him and he would like to discuss this with family members first.    I have consulted palliative medicine to help with determination of code status and goals of care.  Once we have lab work that confirms treatment parameters are met, we will request the patient report to the [place] to embark on cycle 1 of treatment.  All questions were answered. The patient knows to call the clinic with any problems, questions or concerns. We can certainly see the  patient much sooner if necessary.  Patient and plan discussed with Dr. Ancil Linsey and she is in agreement with the aforementioned.   KEFALAS,THOMAS  06/03/2015 9:26 AM    As Above. Patient seen and examined. Need to start therapy and will begin while inpatient.  Continue to discuss with patient terminal nature of disease as he has a difficult time dealing with this. Will continue to address code status.  For cycle #1 Carboplatin/Taxol. Will continue to follow. Donald Pore MD

## 2015-06-03 NOTE — Telephone Encounter (Signed)
[employer] testing will be delayed but should be resulted by December 1 per [employer] representative. She stated that they were running extra quality control checks.

## 2015-06-03 NOTE — Progress Notes (Signed)
C/o right upper back pain, 6/10.  Requesting something for pain.  [name] notified and orders received.

## 2015-06-03 NOTE — Progress Notes (Signed)
Patient tolerated infusion well.  VSS post infusion.

## 2015-06-03 NOTE — Progress Notes (Signed)
Subjective:  Patient was admitted yesterday due to shortness of breath. He has pleurx catheter in his rt pleura, however, heas developed new lesion.  Objective: Vital signs in last 24 hours: Temp:  [97.4 F (36.3 C)-98 F (36.7 C)] 97.4 F (36.3 C) (11/18 0529) Pulse Rate:  [84-97] 84 (11/18 0529) Resp:  [18] 18 (11/18 0529) BP: (118-142)/(68-79) 142/79 mmHg (11/18 0529) SpO2:  [90 %-100 %] 98 % (11/18 0750) Weight change:     Intake/Output from previous day: 11/17 0701 - 11/18 0700 In: 480 [P.O.:480] Out: 1125 [Urine:1125]  PHYSICAL EXAM General appearance: cachectic and moderate distress Resp: diminished breath sounds bilaterally and rhonchi bilaterally Cardio: S1, S2 normal GI: soft, non-tender; bowel sounds normal; no masses,  no organomegaly Extremities: extremities normal, atraumatic, no cyanosis or edema  Lab Results:  Results for orders placed or performed during the hospital encounter of 06/01/15 (from the past 48 hour(s))  Basic metabolic panel     Status: Abnormal   Collection Time: 06/01/15 12:12 PM  Result Value Ref Range   Sodium 135 135 - 145 mmol/L   Potassium 4.6 3.5 - 5.1 mmol/L   Chloride 95 (L) 101 - 111 mmol/L   CO2 29 22 - 32 mmol/L   Glucose, Bld 94 65 - 99 mg/dL   BUN 13 6 - 20 mg/dL   Creatinine, Ser 0.84 0.61 - 1.24 mg/dL   Calcium 9.3 8.9 - 10.3 mg/dL   GFR calc non Af Amer >60 >60 mL/min   GFR calc Af Amer >60 >60 mL/min    Comment: (NOTE) The eGFR has been calculated using the CKD EPI equation. This calculation has not been validated in all clinical situations. eGFR's persistently <60 mL/min signify possible Chronic Kidney Disease.    Anion gap 11 5 - 15  Brain natriuretic peptide     Status: None   Collection Time: 06/01/15 12:12 PM  Result Value Ref Range   B Natriuretic Peptide 62.0 0.0 - 100.0 pg/mL  Troponin I     Status: None   Collection Time: 06/01/15 12:12 PM  Result Value Ref Range   Troponin I <0.03 <0.031 ng/mL     Comment:        NO INDICATION OF MYOCARDIAL INJURY.   CBC with Differential     Status: Abnormal   Collection Time: 06/01/15 12:12 PM  Result Value Ref Range   WBC 15.3 (H) 4.0 - 10.5 K/uL   RBC 3.82 (L) 4.22 - 5.81 MIL/uL   Hemoglobin 10.1 (L) 13.0 - 17.0 g/dL   HCT 31.6 (L) 39.0 - 52.0 %   MCV 82.7 78.0 - 100.0 fL   MCH 26.4 26.0 - 34.0 pg   MCHC 32.0 30.0 - 36.0 g/dL   RDW 14.4 11.5 - 15.5 %   Platelets 866 (H) 150 - 400 K/uL   Neutrophils Relative % 83 %   Neutro Abs 12.6 (H) 1.7 - 7.7 K/uL   Lymphocytes Relative 9 %   Lymphs Abs 1.4 0.7 - 4.0 K/uL   Monocytes Relative 7 %   Monocytes Absolute 1.1 (H) 0.1 - 1.0 K/uL   Eosinophils Relative 1 %   Eosinophils Absolute 0.1 0.0 - 0.7 K/uL   Basophils Relative 0 %   Basophils Absolute 0.0 0.0 - 0.1 K/uL  Lactic acid, plasma     Status: None   Collection Time: 06/01/15  1:07 PM  Result Value Ref Range   Lactic Acid, Venous 1.3 0.5 - 2.0 mmol/L  Urinalysis, Routine w  reflex microscopic     Status: None   Collection Time: 06/01/15  1:10 PM  Result Value Ref Range   Color, Urine YELLOW YELLOW   APPearance CLEAR CLEAR   Specific Gravity, Urine 1.020 1.005 - 1.030   pH 6.0 5.0 - 8.0   Glucose, UA NEGATIVE NEGATIVE mg/dL   Hgb urine dipstick NEGATIVE NEGATIVE   Bilirubin Urine NEGATIVE NEGATIVE   Ketones, ur NEGATIVE NEGATIVE mg/dL   Protein, ur NEGATIVE NEGATIVE mg/dL   Nitrite NEGATIVE NEGATIVE   Leukocytes, UA NEGATIVE NEGATIVE    Comment: MICROSCOPIC NOT DONE ON URINES WITH NEGATIVE PROTEIN, BLOOD, LEUKOCYTES, NITRITE, OR GLUCOSE <1000 mg/dL.  Lactic acid, plasma     Status: None   Collection Time: 06/01/15  3:18 PM  Result Value Ref Range   Lactic Acid, Venous 1.6 0.5 - 2.0 mmol/L  CBC     Status: Abnormal   Collection Time: 06/02/15  6:05 AM  Result Value Ref Range   WBC 16.3 (H) 4.0 - 10.5 K/uL   RBC 3.35 (L) 4.22 - 5.81 MIL/uL   Hemoglobin 8.8 (L) 13.0 - 17.0 g/dL   HCT 28.0 (L) 39.0 - 52.0 %   MCV 83.6 78.0 -  100.0 fL   MCH 26.3 26.0 - 34.0 pg   MCHC 31.4 30.0 - 36.0 g/dL   RDW 14.4 11.5 - 15.5 %   Platelets 817 (H) 150 - 400 K/uL  Comprehensive metabolic panel     Status: Abnormal   Collection Time: 06/02/15  6:05 AM  Result Value Ref Range   Sodium 136 135 - 145 mmol/L   Potassium 4.3 3.5 - 5.1 mmol/L   Chloride 97 (L) 101 - 111 mmol/L   CO2 30 22 - 32 mmol/L   Glucose, Bld 114 (H) 65 - 99 mg/dL   BUN 11 6 - 20 mg/dL   Creatinine, Ser 0.92 0.61 - 1.24 mg/dL   Calcium 9.2 8.9 - 10.3 mg/dL   Total Protein 6.6 6.5 - 8.1 g/dL   Albumin 2.1 (L) 3.5 - 5.0 g/dL   AST 14 (L) 15 - 41 U/L   ALT 10 (L) 17 - 63 U/L   Alkaline Phosphatase 101 38 - 126 U/L   Total Bilirubin 0.4 0.3 - 1.2 mg/dL   GFR calc non Af Amer >60 >60 mL/min   GFR calc Af Amer >60 >60 mL/min    Comment: (NOTE) The eGFR has been calculated using the CKD EPI equation. This calculation has not been validated in all clinical situations. eGFR's persistently <60 mL/min signify possible Chronic Kidney Disease.    Anion gap 9 5 - 15  Basic metabolic panel     Status: Abnormal   Collection Time: 06/03/15  6:12 AM  Result Value Ref Range   Sodium 135 135 - 145 mmol/L   Potassium 4.5 3.5 - 5.1 mmol/L   Chloride 94 (L) 101 - 111 mmol/L   CO2 32 22 - 32 mmol/L   Glucose, Bld 158 (H) 65 - 99 mg/dL   BUN 13 6 - 20 mg/dL   Creatinine, Ser 0.85 0.61 - 1.24 mg/dL   Calcium 9.4 8.9 - 10.3 mg/dL   GFR calc non Af Amer >60 >60 mL/min   GFR calc Af Amer >60 >60 mL/min    Comment: (NOTE) The eGFR has been calculated using the CKD EPI equation. This calculation has not been validated in all clinical situations. eGFR's persistently <60 mL/min signify possible Chronic Kidney Disease.    Anion gap  9 5 - 15    ABGS No results for input(s): PHART, PO2ART, TCO2, HCO3 in the last 72 hours.  Invalid input(s): PCO2 CULTURES No results found for this or any previous visit (from the past 240 hour(s)). Studies/Results: Dg Chest 2  View  06/01/2015  CLINICAL DATA:  Stage IV adenocarcinoma of unknown primary, pleural effusion EXAM: CHEST  2 VIEW COMPARISON:  05/11/2015, CT chest 05/08/2015 FINDINGS: RIGHT subclavian Port-A-Cath with tip projecting over SVC. RIGHT basilar thoracostomy tube/PleurX catheter. Stable heart size and mediastinal contours. Persistent enlargement of LEFT hilum secondary to adenopathy. Persistent partially loculated RIGHT pleural effusion with atelectasis and consolidation in RIGHT lung. Increased LEFT lung infiltrate involving upper and lower lobe. Underlying emphysematous changes with large bulla at LEFT upper lobe again identified. No definite pneumothorax. IMPRESSION: COPD changes with increased infiltrate in LEFT lung since previous exam. This could represent infection though cannot exclude lymphangitic tumor spread. New RIGHT basilar thoracostomy tube/PleurX catheter. Persistent partially loculated RIGHT pleural effusion with atelectasis and infiltrate in RIGHT lung, probably not significantly changed when accounting for differences in technique prior exam. Findings called to Robynn Pane PA on 06/01/2015 at 1012 hours. Electronically Signed   By: Lavonia Dana M.D.   On: 06/01/2015 10:14   Ct Angio Chest Pe W/cm &/or Wo Cm  06/01/2015  CLINICAL DATA:  Short of breath EXAM: CT ANGIOGRAPHY CHEST WITH CONTRAST TECHNIQUE: Multidetector CT imaging of the chest was performed using the standard protocol during bolus administration of intravenous contrast. Multiplanar CT image reconstructions and MIPs were obtained to evaluate the vascular anatomy. CONTRAST:  136mL OMNIPAQUE IOHEXOL 350 MG/ML SOLN COMPARISON:  05/08/2015 FINDINGS: There are no filling defects in the pulmonary arterial tree to suggest acute pulmonary thromboembolism. Loculated right pleural effusion has improved after PleurX drain placement. Extensive consolidation likely due to diffuse infiltration by tumor is not significantly changed throughout  the right lung. Aeration in the right lower lobe has improved. Patchy heterogeneous opacities throughout the left lung have increased. A very small left pleural effusion has developed. Mediastinal and hilar adenopathy has subjectively increased. Aorta and great vessels are patent within the confines of the examination. Left innominate vein and SVC remain patent. Airway is patent. There is some compression of the bronchus intermedius. There are some lytic lesions within the bones. For example see image 40 of series 5 within the right side of the sixth thoracic vertebral body. There is no vertebral compression deformity. Review of the MIP images confirms the above findings. IMPRESSION: There is no evidence of pulmonary thromboembolism. Patchy consolidation versus tumor infiltration throughout the right lung is not significantly changed. Loculated right pleural effusion has improved after PleurX drain placement. Aeration of the right lower lobe has also improved. Very small left pleural effusion has developed. Heterogeneous opacities have increased within the left lung either due to an inflammatory process or endobronchial spread of tumor. New lytic bone lesions worrisome for metastatic disease is suspected. Electronically Signed   By: Marybelle Killings M.D.   On: 06/01/2015 13:52    Medications: I have reviewed the patient's current medications.  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

## 2015-06-03 NOTE — Progress Notes (Signed)
Brian Nelson Tolerated chemotherapy well Discharged back to 3rd floor

## 2015-06-03 NOTE — Patient Instructions (Signed)
[facility] Discharge Instructions for Patients Receiving Chemotherapy  Today you received the following chemotherapy agents:  Taxol and carboplatin  If you develop nausea and vomiting, or diarrhea that is not controlled by your medication, call the clinic.  The clinic phone number is [phone]. Office hours are Monday-Friday 8:30am-5:00pm.  BELOW ARE SYMPTOMS THAT SHOULD BE REPORTED IMMEDIATELY:  *FEVER GREATER THAN 101.0 F  *CHILLS WITH OR WITHOUT FEVER  NAUSEA AND VOMITING THAT IS NOT CONTROLLED WITH YOUR NAUSEA MEDICATION  *UNUSUAL SHORTNESS OF BREATH  *UNUSUAL BRUISING OR BLEEDING  TENDERNESS IN MOUTH AND THROAT WITH OR WITHOUT PRESENCE OF ULCERS  *URINARY PROBLEMS  *BOWEL PROBLEMS  UNUSUAL RASH Items with * indicate a potential emergency and should be followed up as soon as possible. If you have an emergency after office hours please contact your primary care physician or go to the nearest emergency department.  Please call the clinic during office hours if you have any questions or concerns.   You may also contact the Patient Navigator at [phone] should you have any questions or need assistance in obtaining follow up care.

## 2015-06-03 NOTE — Plan of Care (Signed)
Problem: Safety: Goal: Ability to remain free from injury will improve Outcome: Progressing Pt remains free of falls and injury.   Problem: Pain Managment: Goal: General experience of comfort will improve Outcome: Progressing Pt stated he was in a moderate amount of pain rating his pain a 6. Gave pain medicine and a heating pad for his back. Pt now resting comfortably in bed and rates his pain as a 2.   Problem: Physical Regulation: Goal: Ability to maintain clinical measurements within normal limits will improve Outcome: Progressing See flowsheet.  Problem: Tissue Perfusion: Goal: Risk factors for ineffective tissue perfusion will decrease Outcome: Progressing Pt's VTE is Lovenox.

## 2015-06-03 NOTE — Consult Note (Signed)
Consult requested by:Dr. Legrand Rams Consult requested for respiratory failure/abnormal chest CT:  HPI: This is a 54 year old who has adenocarcinoma of unknown primary. Based on testing this has probability of being gastroesophageal adenocarcinoma. He has what looks like metastatic disease in his right lung had a large pleural effusion that has been drained with a Pleurx catheter, some mediastinal adenopathy, and now has infiltrate on the left which may be from pneumonia or from metastatic cancer and has new lytic lesions in the bone worrisome for further metastatic disease. He came to the emergency room with shortness of breath. He has significant previous history of tobacco or illicit drug and alcohol abuse but stopped all of those in the last 6 weeks. He has been draining his Pleurx catheter about once or twice a day and did not show a significant pleural effusion now. He has been on home oxygen. He was scheduled to have chemotherapy today.  Past Medical History  Diagnosis Date  . Tobacco use disorder 04/23/2015    Quit Fall 2016  . ETOH abuse     Quit Fall 2016  . Illicit drug use     Quit Fall 2016  . On home O2   . 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 Pleural effusion on right      Family History  Problem Relation Age of Onset  . Diabetes Mother      Social History   Social History  . Marital Status: Married    Spouse Name: N/A  . Number of Children: N/A  . Years of Education: N/A   Occupational History  . Concrete work    Social History Main Topics  . Smoking status: Former Smoker -- 2.00 packs/day for 40 years    Types: Cigarettes    Quit date: 04/22/2015  . Smokeless tobacco: Never Used  . Alcohol Use: 3.6 oz/week    6 Cans of beer per week     Comment: Quit in fall 2016  . Drug Use: Yes    Special: Cocaine, Marijuana     Comment: Quit in Fall 2016  . Sexual Activity: Not Asked   Other  Topics Concern  . None   Social History Narrative     ROS: He does not complain of pain or palpitations. He is not having hemoptysis. He is still short of breath. His appetite is fair. No nausea vomiting or diarrhea.    Objective: Vital signs in last 24 hours: Temp:  [97.4 F (36.3 C)-98 F (36.7 C)] 97.4 F (36.3 C) (11/18 0529) Pulse Rate:  [84-97] 84 (11/18 0529) Resp:  [18] 18 (11/18 0529) BP: (118-142)/(68-79) 142/79 mmHg (11/18 0529) SpO2:  [90 %-100 %] 98 % (11/18 0750) Weight change:     Intake/Output from previous day: 11/17 0701 - 11/18 0700 In: 480 [P.O.:480] Out: 1125 [Urine:1125]  PHYSICAL EXAM He is awake and alert and looks relatively comfortable. He is wearing high flow oxygen. His pupils are reactive. Nose and throat are clear. Mucous membranes are moist. His neck is supple without masses. His chest is relatively clear with some rhonchi bilaterally. His heart is regular without gallop. Abdomen soft no masses bowel sounds present and active. Extremities showed no edema. It looks like he is starting to develop some clubbing of the digits. Central nervous system examination is grossly intact  Lab Results: Basic Metabolic Panel:  Recent Labs  06/02/15 0605 06/03/15 0612  NA 136 135  K 4.3 4.5  CL 97* 94*  CO2 30 32  GLUCOSE 114* 158*  BUN 11 13  CREATININE 0.92 0.85  CALCIUM 9.2 9.4   Liver Function Tests:  Recent Labs  06/02/15 0605  AST 14*  ALT 10*  ALKPHOS 101  BILITOT 0.4  PROT 6.6  ALBUMIN 2.1*   No results for input(s): LIPASE, AMYLASE in the last 72 hours. No results for input(s): AMMONIA in the last 72 hours. CBC:  Recent Labs  06/01/15 1212 06/02/15 0605  WBC 15.3* 16.3*  NEUTROABS 12.6*  --   HGB 10.1* 8.8*  HCT 31.6* 28.0*  MCV 82.7 83.6  PLT 866* 817*   Cardiac Enzymes:  Recent Labs  06/01/15 1212  TROPONINI <0.03   BNP: No results for input(s): PROBNP in the last 72 hours. D-Dimer: No results for input(s):  DDIMER in the last 72 hours. CBG: No results for input(s): GLUCAP in the last 72 hours. Hemoglobin A1C: No results for input(s): HGBA1C in the last 72 hours. Fasting Lipid Panel: No results for input(s): CHOL, HDL, LDLCALC, TRIG, CHOLHDL, LDLDIRECT in the last 72 hours. Thyroid Function Tests: No results for input(s): TSH, T4TOTAL, FREET4, T3FREE, THYROIDAB in the last 72 hours. Anemia Panel: No results for input(s): VITAMINB12, FOLATE, FERRITIN, TIBC, IRON, RETICCTPCT in the last 72 hours. Coagulation: No results for input(s): LABPROT, INR in the last 72 hours. Urine Drug Screen: Drugs of Abuse  No results found for: LABOPIA, COCAINSCRNUR, LABBENZ, AMPHETMU, THCU, LABBARB  Alcohol Level: No results for input(s): ETH in the last 72 hours. Urinalysis:  Recent Labs  06/01/15 [place]  LABSPEC 1.020  PHURINE 6.0  GLUCOSEU NEGATIVE  HGBUR NEGATIVE  BILIRUBINUR NEGATIVE  KETONESUR NEGATIVE  PROTEINUR NEGATIVE  NITRITE NEGATIVE  LEUKOCYTESUR NEGATIVE   Misc. Labs:   ABGS: No results for input(s): PHART, PO2ART, TCO2, HCO3 in the last 72 hours.  Invalid input(s): PCO2   MICROBIOLOGY: No results found for this or any previous visit (from the past 240 hour(s)).  Studies/Results: Dg Chest 2 View  06/01/2015  CLINICAL DATA:  Stage IV adenocarcinoma of unknown primary, pleural effusion EXAM: CHEST  2 VIEW COMPARISON:  05/11/2015, CT chest 05/08/2015 FINDINGS: RIGHT subclavian Port-A-Cath with tip projecting over SVC. RIGHT basilar thoracostomy tube/PleurX catheter. Stable heart size and mediastinal contours. Persistent enlargement of LEFT hilum secondary to adenopathy. Persistent partially loculated RIGHT pleural effusion with atelectasis and consolidation in RIGHT lung. Increased LEFT lung infiltrate involving upper and lower lobe. Underlying emphysematous changes with large bulla at LEFT upper lobe again identified. No definite pneumothorax. IMPRESSION: COPD  changes with increased infiltrate in LEFT lung since previous exam. This could represent infection though cannot exclude lymphangitic tumor spread. New RIGHT basilar thoracostomy tube/PleurX catheter. Persistent partially loculated RIGHT pleural effusion with atelectasis and infiltrate in RIGHT lung, probably not significantly changed when accounting for differences in technique prior exam. Findings called to Robynn Pane PA on 06/01/2015 at 1012 hours. Electronically Signed   By: Lavonia Dana M.D.   On: 06/01/2015 10:14   Ct Angio Chest Pe W/cm &/or Wo Cm  06/01/2015  CLINICAL DATA:  Short of breath EXAM: CT ANGIOGRAPHY CHEST WITH CONTRAST TECHNIQUE: Multidetector CT imaging of the chest was performed using the standard protocol during bolus administration of intravenous contrast. Multiplanar CT image reconstructions and MIPs were obtained to evaluate the vascular anatomy. CONTRAST:  179mL OMNIPAQUE IOHEXOL 350 MG/ML SOLN COMPARISON:  05/08/2015 FINDINGS: There are no filling defects in the pulmonary arterial tree to suggest acute  pulmonary thromboembolism. Loculated right pleural effusion has improved after PleurX drain placement. Extensive consolidation likely due to diffuse infiltration by tumor is not significantly changed throughout the right lung. Aeration in the right lower lobe has improved. Patchy heterogeneous opacities throughout the left lung have increased. A very small left pleural effusion has developed. Mediastinal and hilar adenopathy has subjectively increased. Aorta and great vessels are patent within the confines of the examination. Left innominate vein and SVC remain patent. Airway is patent. There is some compression of the bronchus intermedius. There are some lytic lesions within the bones. For example see image 40 of series 5 within the right side of the sixth thoracic vertebral body. There is no vertebral compression deformity. Review of the MIP images confirms the above findings.  IMPRESSION: There is no evidence of pulmonary thromboembolism. Patchy consolidation versus tumor infiltration throughout the right lung is not significantly changed. Loculated right pleural effusion has improved after PleurX drain placement. Aeration of the right lower lobe has also improved. Very small left pleural effusion has developed. Heterogeneous opacities have increased within the left lung either due to an inflammatory process or endobronchial spread of tumor. New lytic bone lesions worrisome for metastatic disease is suspected. Electronically Signed   By: Marybelle Killings M.D.   On: 06/01/2015 13:52    Medications:  Prior to Admission:  Prescriptions prior to admission  Medication Sig Dispense Refill Last Dose  . albuterol (PROVENTIL HFA;VENTOLIN HFA) 108 (90 BASE) MCG/ACT inhaler Inhale 2 puffs into the lungs every 6 (six) hours as needed for wheezing or shortness of breath. 1 Inhaler 2 06/01/2015 at Unknown time  . dexamethasone (DECADRON) 4 MG tablet The day before chemo take 5 tabs in the am and 5 tabs in the pm. The morning of chemo take 5 tabs prior to chemo appt. 45 tablet 1 Past Month at Unknown time  . fluconazole (DIFLUCAN) 100 MG tablet Take 1 tablet (100 mg total) by mouth daily. 14 tablet 0 Past Week at Unknown time  . Fluticasone-Salmeterol (ADVAIR DISKUS) 250-50 MCG/DOSE AEPB Inhale 1 puff into the lungs 2 (two) times daily. 60 each 12 06/01/2015 at Unknown time  . ondansetron (ZOFRAN) 8 MG tablet Take 1 tablet (8 mg total) by mouth every 8 (eight) hours as needed for nausea or vomiting. 30 tablet 2 Past Month at Unknown time  . oxyCODONE-acetaminophen (PERCOCET/ROXICET) 5-325 MG tablet Take 1-2 tablets by mouth every 4 (four) hours as needed for moderate pain or severe pain. 100 tablet 0 06/01/2015 at Unknown time  . prochlorperazine (COMPAZINE) 10 MG tablet Take 1 tablet (10 mg total) by mouth every 6 (six) hours as needed for nausea or vomiting. 30 tablet 2 Past Month at Unknown  time  . CARBOPLATIN IV Inject into the vein. Every 21 days     . lidocaine-prilocaine (EMLA) cream Apply a quarter size amount to port site 1 hour prior to chemo. Do not rub in. Cover with plastic wrap. 30 g 2   . PACLitaxel (TAXOL IV) Inject into the vein. Every 21 days     . Pegfilgrastim (NEULASTA DELIVERY KIT [place]) Inject into the skin. Every 21 days      Scheduled: . albuterol  2.5 mg Nebulization Q4H  . dexamethasone  20 mg Oral BID  . enoxaparin (LOVENOX) injection  40 mg Subcutaneous Q24H  . feeding supplement (ENSURE ENLIVE)  237 mL Oral BID BM  . fluconazole  100 mg Oral Daily  . mometasone-formoterol  2 puff Inhalation BID  .  piperacillin-tazobactam (ZOSYN)  IV  3.375 g Intravenous Q8H  . vancomycin  500 mg Intravenous Q8H   Continuous:  VVY:[id number], oxyCODONE-acetaminophen, prochlorperazine  Assesment: He has hypoxic respiratory failure which is multifactorial. He has an adenocarcinoma which is of unknown primary but felt to likely be of gastroesophageal origin. He has an infiltrate now on the left side and I think he should be treated as he is for healthcare associated pneumonia but I am more concerned that this is metastatic tumor. He appears to have new bony metastases.  He has COPD at baseline and has chronic hypoxic respiratory failure but has now acute exacerbation.  He has severe protein calorie malnutrition related to his tumor. 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 I think he is on appropriate antibiotics. This may be more related to metastatic cancer but I don't think we can rule out that he has bacterial  pneumonia.    LOS: 2 days   [place] Sara L 06/03/2015, 8:13 AM

## 2015-06-03 NOTE — Progress Notes (Signed)
Consent signed for Carboplatin and Taxol. Distress screening done.

## 2015-06-04 MED ORDER — SALINE SPRAY 0.65 % NA SOLN
1.0000 | NASAL | Status: DC | PRN
Start: 2015-06-04 — End: 2015-06-08
  Administered 2015-06-04 – 2015-06-05 (×5): 1 via NASAL
  Filled 2015-06-04: qty 44

## 2015-06-04 MED ORDER — CETYLPYRIDINIUM CHLORIDE 0.05 % MT LIQD
7.0000 mL | Freq: Two times a day (BID) | OROMUCOSAL | Status: DC
  Administered 2015-06-04 – 2015-06-08 (×8): 7 mL via OROMUCOSAL

## 2015-06-04 NOTE — Progress Notes (Signed)
ANTIBIOTIC CONSULT NOTE - INITIAL  Pharmacy Consult for Vancomycin & Zosyn Indication: pneumonia  No Known Allergies  Patient Measurements: Height: '5\' 8"'$  (172.7 cm) Weight: 122 lb 5.7 oz (55.5 kg) IBW/kg (Calculated) : 68.4 Adjusted Body Weight:   Vital Signs: Temp: 97.6 F (36.4 C) (11/19 0601) Temp Source: Oral (11/19 0601) BP: 135/78 mmHg (11/19 0601) Pulse Rate: 92 (11/19 0601) Intake/Output from previous day: 11/18 0701 - 11/19 0700 In: 250 [P.O.:240; I.V.:10] Out: 625 [Urine:625] Intake/Output from this shift:    Labs:  Recent Labs  06/01/15 1212 06/02/15 0605 06/03/15 0612 06/03/15 0915  WBC 15.3* 16.3*  --  13.2*  HGB 10.1* 8.8*  --  9.3*  PLT 866* 817*  --  849*  CREATININE 0.84 0.92 0.85  --    Estimated Creatinine Clearance: 79.8 mL/min (by C-G formula based on Cr of 0.85). No results for input(s): VANCOTROUGH, VANCOPEAK, VANCORANDOM, GENTTROUGH, GENTPEAK, GENTRANDOM, TOBRATROUGH, TOBRAPEAK, TOBRARND, AMIKACINPEAK, AMIKACINTROU, AMIKACIN in the last 72 hours.   Microbiology: Recent Results (from the past 720 hour(s))  Culture, blood (routine x 2)     Status: None   Collection Time: 05/08/15  3:45 AM  Result Value Ref Range Status   Specimen Description BLOOD RIGHT ANTECUBITAL  Final   Special Requests BOTTLES DRAWN AEROBIC AND ANAEROBIC 6CC  Final   Culture NO GROWTH 5 DAYS  Final   Report Status 05/13/2015 FINAL  Final  Culture, blood (routine x 2)     Status: None   Collection Time: 05/08/15  3:55 AM  Result Value Ref Range Status   Specimen Description BLOOD RIGHT ARM  Final   Special Requests BOTTLES DRAWN AEROBIC AND ANAEROBIC 6CC  Final   Culture NO GROWTH 5 DAYS  Final   Report Status 05/13/2015 FINAL  Final  Surgical pcr screen     Status: None   Collection Time: 05/11/15  1:05 AM  Result Value Ref Range Status   MRSA, PCR NEGATIVE NEGATIVE Final   Staphylococcus aureus NEGATIVE NEGATIVE Final    Comment:        The Xpert SA Assay  (FDA approved for NASAL specimens in patients over 39 years of age), is one component of a comprehensive surveillance program.  Test performance has been validated by [facility] for patients greater than or equal to 50 year old. It is not intended to diagnose infection nor to guide or monitor treatment.     Medical History: Past Medical History  Diagnosis Date  . Tobacco use disorder 04/23/2015    Quit Fall 2016  . ETOH abuse     Quit Fall 2016  . Illicit drug use     Quit Fall 2016  . On home O2   . 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 Pleural effusion on right     Medications:  Scheduled:  . albuterol  2.5 mg Nebulization Q4H  . antiseptic oral rinse  7 mL Mouth Rinse BID  . enoxaparin (LOVENOX) injection  40 mg Subcutaneous Q24H  . feeding supplement (ENSURE ENLIVE)  237 mL Oral BID BM  . filgrastim  300 mcg Subcutaneous Daily  . fluconazole  100 mg Oral Daily  . methylPREDNISolone (SOLU-MEDROL) injection  40 mg Intravenous Q12H  . mometasone-formoterol  2 puff Inhalation BID  . piperacillin-tazobactam (ZOSYN)  IV  3.375 g Intravenous Q8H  . vancomycin  500 mg Intravenous Q8H   Assessment:  Dyspnea.The etiology is not entirely clear.Patient  does not have PE. Patient does have a new infiltrate in the left lung which may indicate spread of cancer versus infectious process. In addition, patient appears to have new lytic bone lesions. Will be treated for possible pneumonia with broad-spectrum antibiotics intravenously. Vancomycin  and Zosyn empiric regimen. No cultures. No labs today.  Missed dose of vanc yesterday, will check trough in AM  Goal of Therapy:  Vancomycin trough level 15-20 mcg/ml  Plan:  Continue Vancomycin 500 mg IV every 8 hours Continue Zosyn 3.375 GM IV every 8 hours, each dose over 4 hours, Vancomycin Trough at steady state in AM Monitor renal function Labs per  protocol  Isac Sarna, BS Pharm D, BCPS Clinical Pharmacist Pager [phone] 06/04/2015,10:20 AM

## 2015-06-04 NOTE — Progress Notes (Signed)
Subjective: He had chemotherapy yesterday and thus far has tolerated it well. No nausea or vomiting. He says his breathing is a little bit better.  Objective: Vital signs in last 24 hours: Temp:  [97.6 F (36.4 C)-98.3 F (36.8 C)] 97.6 F (36.4 C) (11/19 0601) Pulse Rate:  [92-109] 92 (11/19 0601) Resp:  [12-20] 12 (11/19 0601) BP: (135-160)/(78-93) 135/78 mmHg (11/19 0601) SpO2:  [90 %-100 %] 90 % (11/19 0731) Weight change:  Last BM Date: 06/02/15  Intake/Output from previous day: 11/18 0701 - 11/19 0700 In: 250 [P.O.:240; I.V.:10] Out: 625 [Urine:625]  PHYSICAL EXAM General appearance: alert, cooperative and mild distress Resp: rhonchi bilaterally Cardio: regular rate and rhythm, S1, S2 normal, no murmur, click, rub or gallop GI: soft, non-tender; bowel sounds normal; no masses,  no organomegaly Extremities: extremities normal, atraumatic, no cyanosis or edema  Lab Results:  Results for orders placed or performed during the hospital encounter of 06/01/15 (from the past 48 hour(s))  Basic metabolic panel     Status: Abnormal   Collection Time: 06/03/15  6:12 AM  Result Value Ref Range   Sodium 135 135 - 145 mmol/L   Potassium 4.5 3.5 - 5.1 mmol/L   Chloride 94 (L) 101 - 111 mmol/L   CO2 32 22 - 32 mmol/L   Glucose, Bld 158 (H) 65 - 99 mg/dL   BUN 13 6 - 20 mg/dL   Creatinine, Ser 0.85 0.61 - 1.24 mg/dL   Calcium 9.4 8.9 - 10.3 mg/dL   GFR calc non Af Amer >60 >60 mL/min   GFR calc Af Amer >60 >60 mL/min    Comment: (NOTE) The eGFR has been calculated using the CKD EPI equation. This calculation has not been validated in all clinical situations. eGFR's persistently <60 mL/min signify possible Chronic Kidney Disease.    Anion gap 9 5 - 15  CBC with Differential     Status: Abnormal   Collection Time: 06/03/15  9:15 AM  Result Value Ref Range   WBC 13.2 (H) 4.0 - 10.5 K/uL   RBC 3.47 (L) 4.22 - 5.81 MIL/uL   Hemoglobin 9.3 (L) 13.0 - 17.0 g/dL   HCT 29.2 (L)  39.0 - 52.0 %   MCV 84.1 78.0 - 100.0 fL   MCH 26.8 26.0 - 34.0 pg   MCHC 31.8 30.0 - 36.0 g/dL   RDW 14.4 11.5 - 15.5 %   Platelets 849 (H) 150 - 400 K/uL   Neutrophils Relative % 95 %   Neutro Abs 12.5 (H) 1.7 - 7.7 K/uL   Lymphocytes Relative 4 %   Lymphs Abs 0.5 (L) 0.7 - 4.0 K/uL   Monocytes Relative 1 %   Monocytes Absolute 0.2 0.1 - 1.0 K/uL   Eosinophils Relative 0 %   Eosinophils Absolute 0.0 0.0 - 0.7 K/uL   Basophils Relative 0 %   Basophils Absolute 0.0 0.0 - 0.1 K/uL  Hepatic function panel     Status: Abnormal   Collection Time: 06/03/15  9:15 AM  Result Value Ref Range   Total Protein 7.0 6.5 - 8.1 g/dL   Albumin 2.3 (L) 3.5 - 5.0 g/dL   AST 20 15 - 41 U/L   ALT 10 (L) 17 - 63 U/L   Alkaline Phosphatase 103 38 - 126 U/L   Total Bilirubin 0.2 (L) 0.3 - 1.2 mg/dL   Bilirubin, Direct <0.1 (L) 0.1 - 0.5 mg/dL   Indirect Bilirubin NOT CALCULATED 0.3 - 0.9 mg/dL    ABGS  No results for input(s): PHART, PO2ART, TCO2, HCO3 in the last 72 hours.  Invalid input(s): PCO2 CULTURES No results found for this or any previous visit (from the past 240 hour(s)). Studies/Results: No results found.  Medications:  Prior to Admission:  Prescriptions prior to admission  Medication Sig Dispense Refill Last Dose  . albuterol (PROVENTIL HFA;VENTOLIN HFA) 108 (90 BASE) MCG/ACT inhaler Inhale 2 puffs into the lungs every 6 (six) hours as needed for wheezing or shortness of breath. 1 Inhaler 2 06/01/2015 at Unknown time  . dexamethasone (DECADRON) 4 MG tablet The day before chemo take 5 tabs in the am and 5 tabs in the pm. The morning of chemo take 5 tabs prior to chemo appt. 45 tablet 1 Past Month at Unknown time  . fluconazole (DIFLUCAN) 100 MG tablet Take 1 tablet (100 mg total) by mouth daily. 14 tablet 0 Past Week at Unknown time  . Fluticasone-Salmeterol (ADVAIR DISKUS) 250-50 MCG/DOSE AEPB Inhale 1 puff into the lungs 2 (two) times daily. 60 each 12 06/01/2015 at Unknown time  .  ondansetron (ZOFRAN) 8 MG tablet Take 1 tablet (8 mg total) by mouth every 8 (eight) hours as needed for nausea or vomiting. 30 tablet 2 Past Month at Unknown time  . oxyCODONE-acetaminophen (PERCOCET/ROXICET) 5-325 MG tablet Take 1-2 tablets by mouth every 4 (four) hours as needed for moderate pain or severe pain. 100 tablet 0 06/01/2015 at Unknown time  . prochlorperazine (COMPAZINE) 10 MG tablet Take 1 tablet (10 mg total) by mouth every 6 (six) hours as needed for nausea or vomiting. 30 tablet 2 Past Month at Unknown time  . CARBOPLATIN IV Inject into the vein. Every 21 days     . lidocaine-prilocaine (EMLA) cream Apply a quarter size amount to port site 1 hour prior to chemo. Do not rub in. Cover with plastic wrap. 30 g 2   . PACLitaxel (TAXOL IV) Inject into the vein. Every 21 days     . Pegfilgrastim (NEULASTA DELIVERY KIT [place]) Inject into the skin. Every 21 days      Scheduled: . albuterol  2.5 mg Nebulization Q4H  . antiseptic oral rinse  7 mL Mouth Rinse BID  . enoxaparin (LOVENOX) injection  40 mg Subcutaneous Q24H  . feeding supplement (ENSURE ENLIVE)  237 mL Oral BID BM  . filgrastim  300 mcg Subcutaneous Daily  . fluconazole  100 mg Oral Daily  . methylPREDNISolone (SOLU-MEDROL) injection  40 mg Intravenous Q12H  . mometasone-formoterol  2 puff Inhalation BID  . piperacillin-tazobactam (ZOSYN)  IV  3.375 g Intravenous Q8H  . vancomycin  500 mg Intravenous Q8H   Continuous:  PFX:[id number], oxyCODONE-acetaminophen, prochlorperazine, sodium chloride  Assesment: He was admitted with healthcare associated pneumonia and acute on chronic respiratory failure. It is possible that the infiltrate on chest x-ray is actually metastatic cancer. 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 He has a chronic right pleural effusion and has Pleurx catheter in place. He had chemotherapy yesterday and has tolerated it okay. 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    LOS: 3 days   Brian Nelson L 06/04/2015, 10:56 AM

## 2015-06-05 LAB — CBC WITH DIFFERENTIAL/PLATELET
BASOS ABS: 0 10*3/uL (ref 0.0–0.1)
Basophils Relative: 0 %
Eosinophils Absolute: 0 10*3/uL (ref 0.0–0.7)
Eosinophils Relative: 0 %
HCT: 29.4 % — ABNORMAL LOW (ref 39.0–52.0)
HEMOGLOBIN: 9.2 g/dL — AB (ref 13.0–17.0)
LYMPHS ABS: 0.6 10*3/uL — AB (ref 0.7–4.0)
Lymphocytes Relative: 1 %
MCH: 27.2 pg (ref 26.0–34.0)
MCHC: 31.3 g/dL (ref 30.0–36.0)
MCV: 87 fL (ref 78.0–100.0)
MONO ABS: 0.6 10*3/uL (ref 0.1–1.0)
MONOS PCT: 1 %
NEUTROS ABS: 58 10*3/uL — AB (ref 1.7–7.7)
Neutrophils Relative %: 98 %
Platelets: 667 10*3/uL — ABNORMAL HIGH (ref 150–400)
RBC: 3.38 MIL/uL — ABNORMAL LOW (ref 4.22–5.81)
RDW: 14.5 % (ref 11.5–15.5)
WBC: 59.2 10*3/uL (ref 4.0–10.5)

## 2015-06-05 LAB — VANCOMYCIN, TROUGH: VANCOMYCIN TR: 8 ug/mL — AB (ref 10.0–20.0)

## 2015-06-05 MED ORDER — VANCOMYCIN HCL 10 G IV SOLR
1250.0000 mg | Freq: Two times a day (BID) | INTRAVENOUS | Status: DC
  Administered 2015-06-05 – 2015-06-08 (×7): 1250 mg via INTRAVENOUS
  Filled 2015-06-05 (×11): qty 1250

## 2015-06-05 MED ORDER — ALPRAZOLAM 0.5 MG PO TABS
0.5000 mg | ORAL_TABLET | Freq: Four times a day (QID) | ORAL | Status: DC | PRN
  Administered 2015-06-05: 0.5 mg via ORAL
  Filled 2015-06-05 (×2): qty 1

## 2015-06-05 NOTE — Progress Notes (Signed)
ANTIBIOTIC CONSULT NOTE - INITIAL  Pharmacy Consult for Vancomycin & Zosyn Indication: pneumonia  No Known Allergies  Patient Measurements: Height: '5\' 8"'$  (172.7 cm) Weight: 122 lb 5.7 oz (55.5 kg) IBW/kg (Calculated) : 68.4 Adjusted Body Weight:   Vital Signs: Temp: 97.8 F (36.6 C) (11/20 0632) Temp Source: Oral (11/20 8466) BP: 124/80 mmHg (11/20 5993) Pulse Rate: 94 (11/20 0632) Intake/Output from previous day: 11/19 0701 - 11/20 0700 In: 780 [P.O.:480; IV Piggyback:300] Out: 800 [Urine:800] Intake/Output from this shift:    Labs:  Recent Labs  06/03/15 0612 06/03/15 0915 06/05/15 1003  WBC  --  13.2* 59.2*  HGB  --  9.3* 9.2*  PLT  --  849* 667*  CREATININE 0.85  --   --    Estimated Creatinine Clearance: 79.8 mL/min (by C-G formula based on Cr of 0.85).  Recent Labs  06/05/15 1003  [place] 8*     Microbiology: Recent Results (from the past 720 hour(s))  Culture, blood (routine x 2)     Status: None   Collection Time: 05/08/15  3:45 AM  Result Value Ref Range Status   Specimen Description BLOOD RIGHT ANTECUBITAL  Final   Special Requests BOTTLES DRAWN AEROBIC AND ANAEROBIC 6CC  Final   Culture NO GROWTH 5 DAYS  Final   Report Status 05/13/2015 FINAL  Final  Culture, blood (routine x 2)     Status: None   Collection Time: 05/08/15  3:55 AM  Result Value Ref Range Status   Specimen Description BLOOD RIGHT ARM  Final   Special Requests BOTTLES DRAWN AEROBIC AND ANAEROBIC 6CC  Final   Culture NO GROWTH 5 DAYS  Final   Report Status 05/13/2015 FINAL  Final  Surgical pcr screen     Status: None   Collection Time: 05/11/15  1:05 AM  Result Value Ref Range Status   MRSA, PCR NEGATIVE NEGATIVE Final   Staphylococcus aureus NEGATIVE NEGATIVE Final    Comment:        The Xpert SA Assay (FDA approved for NASAL specimens in patients over 73 years of age), is one component of a comprehensive surveillance program.  Test performance has been validated  by [facility] for patients greater than or equal to 58 year old. It is not intended to diagnose infection nor to guide or monitor treatment.     Medical History: Past Medical History  Diagnosis Date  . Tobacco use disorder 04/23/2015    Quit Fall 2016  . ETOH abuse     Quit Fall 2016  . Illicit drug use     Quit Fall 2016  . On home O2   . 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 Pleural effusion on right     Medications:  Scheduled:  . albuterol  2.5 mg Nebulization Q4H  . antiseptic oral rinse  7 mL Mouth Rinse BID  . enoxaparin (LOVENOX) injection  40 mg Subcutaneous Q24H  . feeding supplement (ENSURE ENLIVE)  237 mL Oral BID BM  . filgrastim  300 mcg Subcutaneous Daily  . fluconazole  100 mg Oral Daily  . methylPREDNISolone (SOLU-MEDROL) injection  40 mg Intravenous Q12H  . mometasone-formoterol  2 puff Inhalation BID  . piperacillin-tazobactam (ZOSYN)  IV  3.375 g Intravenous Q8H  . vancomycin  1,250 mg Intravenous Q12H   Assessment:  Dyspnea.The etiology is not entirely clear.Patient does not have PE. Patient does have a new infiltrate in the left  lung which may indicate spread of cancer versus infectious process. In addition, patient appears to have new lytic bone lesions. Will be treated for possible pneumonia with broad-spectrum antibiotics intravenously. Vancomycin  and Zosyn empiric regimen. No cultures. No labs today.  Vancomycin trough 2mg/ml. Will adjust dose for troughs of 15-246m/ml  Goal of Therapy:  Vancomycin trough level 15-20 mcg/ml  Plan:  Vancomycin '1250mg'$  IV q12h Continue Zosyn 3.375 GM IV every 8 hours, each dose over 4 hours,  Monitor renal function Labs per protocol  LoIsac SarnaBS PhVena AustriaBCPS Clinical Pharmacist Pager #[phone]/20/2016,11:41 AM

## 2015-06-05 NOTE — Progress Notes (Signed)
CRITICAL VALUE ALERT  Critical value received:  WBC 59.2  Date of notification:  06/05/2015  Time of notification:  5462  Critical value read back:Yes.    Nurse who received alert:  Gershon Cull, RN  MD notified (1st page):  Dr. Luan Pulling  Time of first page:  1045  MD notified (2nd page):  Time of second page:  Responding MD:  Dr. Luan Pulling  Time MD responded:  1045  Dr. Luan Pulling responded.  Continue current treatments and will repeat labs in the morning.

## 2015-06-05 NOTE — Progress Notes (Signed)
Subjective: He says he feels a little better but he is still short of breath. He has no other new complaints. No nausea or vomiting  Objective: Vital signs in last 24 hours: Temp:  [97.5 F (36.4 C)-97.8 F (36.6 C)] 97.8 F (36.6 C) (11/20 6812) Pulse Rate:  [93-95] 94 (11/20 0632) Resp:  [18-20] 18 (11/20 7517) BP: (124-149)/(69-88) 124/80 mmHg (11/20 0632) SpO2:  [90 %-100 %] 95 % (11/20 0759) Weight change:  Last BM Date: 06/02/15  Intake/Output from previous day: 11/19 0701 - 11/20 0700 In: 780 [P.O.:480; IV Piggyback:300] Out: 800 [Urine:800]  PHYSICAL EXAM General appearance: alert, cooperative and mild distress Resp: He has diminished breath sounds on the right and rhonchi on the left Cardio: regular rate and rhythm, S1, S2 normal, no murmur, click, rub or gallop GI: soft, non-tender; bowel sounds normal; no masses,  no organomegaly Extremities: extremities normal, atraumatic, no cyanosis or edema  Lab Results:  Results for orders placed or performed during the hospital encounter of 06/01/15 (from the past 48 hour(s))  CBC with Differential/Platelet     Status: Abnormal   Collection Time: 06/05/15 10:03 AM  Result Value Ref Range   WBC 59.2 (HH) 4.0 - 10.5 K/uL    Comment: WHITE COUNT CONFIRMED ON SMEAR REPEATED TO VERIFY CRITICAL RESULT CALLED TO, READ BACK BY AND VERIFIED WITH: Tysen Roesler M. AT 1039A ON 112016 BY THOMPSON S.    RBC 3.38 (L) 4.22 - 5.81 MIL/uL   Hemoglobin 9.2 (L) 13.0 - 17.0 g/dL   HCT 29.4 (L) 39.0 - 52.0 %   MCV 87.0 78.0 - 100.0 fL   MCH 27.2 26.0 - 34.0 pg   MCHC 31.3 30.0 - 36.0 g/dL   RDW 14.5 11.5 - 15.5 %   Platelets 667 (H) 150 - 400 K/uL   Neutrophils Relative % 98 %   Lymphocytes Relative 1 %   Monocytes Relative 1 %   Eosinophils Relative 0 %   Basophils Relative 0 %   Neutro Abs 58.0 (H) 1.7 - 7.7 K/uL   Lymphs Abs 0.6 (L) 0.7 - 4.0 K/uL   Monocytes Absolute 0.6 0.1 - 1.0 K/uL   Eosinophils Absolute 0.0 0.0 - 0.7 K/uL   Basophils Absolute 0.0 0.0 - 0.1 K/uL   RBC Morphology POLYCHROMASIA PRESENT     Comment: BASOPHILIC STIPPLING   Smear Review PLATELET COUNT CONFIRMED BY SMEAR   Vancomycin, trough     Status: Abnormal   Collection Time: 06/05/15 10:03 AM  Result Value Ref Range   Vancomycin Tr 8 (L) 10.0 - 20.0 ug/mL    ABGS No results for input(s): PHART, PO2ART, TCO2, HCO3 in the last 72 hours.  Invalid input(s): PCO2 CULTURES No results found for this or any previous visit (from the past 240 hour(s)). Studies/Results: No results found.  Medications:  Prior to Admission:  Prescriptions prior to admission  Medication Sig Dispense Refill Last Dose  . albuterol (PROVENTIL HFA;VENTOLIN HFA) 108 (90 BASE) MCG/ACT inhaler Inhale 2 puffs into the lungs every 6 (six) hours as needed for wheezing or shortness of breath. 1 Inhaler 2 06/01/2015 at Unknown time  . dexamethasone (DECADRON) 4 MG tablet The day before chemo take 5 tabs in the am and 5 tabs in the pm. The morning of chemo take 5 tabs prior to chemo appt. 45 tablet 1 Past Month at Unknown time  . fluconazole (DIFLUCAN) 100 MG tablet Take 1 tablet (100 mg total) by mouth daily. 14 tablet 0 Past Week at  Unknown time  . Fluticasone-Salmeterol (ADVAIR DISKUS) 250-50 MCG/DOSE AEPB Inhale 1 puff into the lungs 2 (two) times daily. 60 each 12 06/01/2015 at Unknown time  . ondansetron (ZOFRAN) 8 MG tablet Take 1 tablet (8 mg total) by mouth every 8 (eight) hours as needed for nausea or vomiting. 30 tablet 2 Past Month at Unknown time  . oxyCODONE-acetaminophen (PERCOCET/ROXICET) 5-325 MG tablet Take 1-2 tablets by mouth every 4 (four) hours as needed for moderate pain or severe pain. 100 tablet 0 06/01/2015 at Unknown time  . prochlorperazine (COMPAZINE) 10 MG tablet Take 1 tablet (10 mg total) by mouth every 6 (six) hours as needed for nausea or vomiting. 30 tablet 2 Past Month at Unknown time  . CARBOPLATIN IV Inject into the vein. Every 21 days     .  lidocaine-prilocaine (EMLA) cream Apply a quarter size amount to port site 1 hour prior to chemo. Do not rub in. Cover with plastic wrap. 30 g 2   . PACLitaxel (TAXOL IV) Inject into the vein. Every 21 days     . Pegfilgrastim (NEULASTA DELIVERY KIT [place]) Inject into the skin. Every 21 days      Scheduled: . albuterol  2.5 mg Nebulization Q4H  . antiseptic oral rinse  7 mL Mouth Rinse BID  . enoxaparin (LOVENOX) injection  40 mg Subcutaneous Q24H  . feeding supplement (ENSURE ENLIVE)  237 mL Oral BID BM  . filgrastim  300 mcg Subcutaneous Daily  . fluconazole  100 mg Oral Daily  . methylPREDNISolone (SOLU-MEDROL) injection  40 mg Intravenous Q12H  . mometasone-formoterol  2 puff Inhalation BID  . piperacillin-tazobactam (ZOSYN)  IV  3.375 g Intravenous Q8H  . vancomycin  500 mg Intravenous Q8H   Continuous:  TMB:[id number], oxyCODONE-acetaminophen, prochlorperazine, sodium chloride  Assesment: He was admitted with healthcare associated pneumonia. 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 He is improving but still short of breath. He has acute hypoxic respiratory failure and is still requiring 10 L oxygen. 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

## 2015-06-06 ENCOUNTER — Inpatient Hospital Stay (HOSPITAL_COMMUNITY): Payer: Medicaid Other

## 2015-06-06 DIAGNOSIS — J90 Pleural effusion, not elsewhere classified: Secondary | ICD-10-CM | POA: Insufficient documentation

## 2015-06-06 DIAGNOSIS — C801 Malignant (primary) neoplasm, unspecified: Secondary | ICD-10-CM

## 2015-06-06 DIAGNOSIS — Z7189 Other specified counseling: Secondary | ICD-10-CM | POA: Insufficient documentation

## 2015-06-06 DIAGNOSIS — J189 Pneumonia, unspecified organism: Secondary | ICD-10-CM

## 2015-06-06 DIAGNOSIS — J439 Emphysema, unspecified: Secondary | ICD-10-CM

## 2015-06-06 DIAGNOSIS — J948 Other specified pleural conditions: Secondary | ICD-10-CM

## 2015-06-06 DIAGNOSIS — E43 Unspecified severe protein-calorie malnutrition: Secondary | ICD-10-CM

## 2015-06-06 DIAGNOSIS — Z515 Encounter for palliative care: Secondary | ICD-10-CM | POA: Insufficient documentation

## 2015-06-06 LAB — CBC WITH DIFFERENTIAL/PLATELET
BASOS ABS: 0 10*3/uL (ref 0.0–0.1)
BASOS PCT: 0 %
EOS PCT: 0 %
Eosinophils Absolute: 0 10*3/uL (ref 0.0–0.7)
HEMATOCRIT: 28 % — AB (ref 39.0–52.0)
Hemoglobin: 8.9 g/dL — ABNORMAL LOW (ref 13.0–17.0)
LYMPHS PCT: 1 %
Lymphs Abs: 0.4 10*3/uL — ABNORMAL LOW (ref 0.7–4.0)
MCH: 27.1 pg (ref 26.0–34.0)
MCHC: 31.8 g/dL (ref 30.0–36.0)
MCV: 85.4 fL (ref 78.0–100.0)
Monocytes Absolute: 0.4 10*3/uL (ref 0.1–1.0)
Monocytes Relative: 1 %
Neutro Abs: 61.8 10*3/uL — ABNORMAL HIGH (ref 1.7–7.7)
Neutrophils Relative %: 99 %
PLATELETS: 650 10*3/uL — AB (ref 150–400)
RBC: 3.28 MIL/uL — AB (ref 4.22–5.81)
RDW: 14.9 % (ref 11.5–15.5)
WBC: 62.6 10*3/uL — AB (ref 4.0–10.5)

## 2015-06-06 LAB — BASIC METABOLIC PANEL
ANION GAP: 9 (ref 5–15)
BUN: 18 mg/dL (ref 6–20)
CALCIUM: 9.5 mg/dL (ref 8.9–10.3)
CO2: 39 mmol/L — ABNORMAL HIGH (ref 22–32)
Chloride: 88 mmol/L — ABNORMAL LOW (ref 101–111)
Creatinine, Ser: 0.71 mg/dL (ref 0.61–1.24)
GFR calc Af Amer: >60 mL/min (ref >60)
Glucose, Bld: 97 mg/dL (ref 65–99)
POTASSIUM: 5.1 mmol/L (ref 3.5–5.1)
SODIUM: 136 mmol/L (ref 135–145)

## 2015-06-06 MED ORDER — MINERAL OIL RE ENEM
1.0000 | ENEMA | Freq: Once | RECTAL | Status: DC | PRN
  Filled 2015-06-06: qty 1

## 2015-06-06 MED ORDER — BISACODYL 10 MG RE SUPP
10.0000 mg | Freq: Every day | RECTAL | Status: DC | PRN
  Administered 2015-06-06 – 2015-06-07 (×2): 10 mg via RECTAL
  Filled 2015-06-06 (×2): qty 1

## 2015-06-06 MED ORDER — SENNOSIDES-DOCUSATE SODIUM 8.6-50 MG PO TABS
2.0000 | ORAL_TABLET | Freq: Two times a day (BID) | ORAL | Status: DC
  Administered 2015-06-06 – 2015-06-08 (×5): 2 via ORAL
  Filled 2015-06-06 (×5): qty 2

## 2015-06-06 MED ORDER — MORPHINE SULFATE (PF) 2 MG/ML IV SOLN
2.0000 mg | INTRAVENOUS | Status: DC | PRN
  Administered 2015-06-06 – 2015-06-08 (×7): 2 mg via INTRAVENOUS
  Filled 2015-06-06 (×7): qty 1

## 2015-06-06 NOTE — Progress Notes (Signed)
Subjective: He's had some trouble with anxiety that seems to be related to his shortness of breath. Yesterday I was called by the nursing staff in order Xanax but he seems to have had an adverse reaction to that. He does say that he is not really very anxious and less he gets the feeling of being breathless. He is draining only a minimal amount from his Pleurx catheter.  Objective: Vital signs in last 24 hours: Temp:  [98 F (36.7 C)-98.4 F (36.9 C)] 98.4 F (36.9 C) (11/21 0628) Pulse Rate:  [97-98] 98 (11/21 0628) Resp:  [18-20] 18 (11/21 0628) BP: (127-140)/(69-79) 127/69 mmHg (11/21 0628) SpO2:  [94 %-99 %] 97 % (11/21 1314) Weight change:  Last BM Date: 06/02/15  Intake/Output from previous day: 11/20 0701 - 11/21 0700 In: -  Out: 220 [Urine:200; Chest Tube:20]  PHYSICAL EXAM General appearance: alert, cooperative and mild distress Resp: Decreased breath sounds on the right still significant rhonchi on the left Cardio: regular rate and rhythm, S1, S2 normal, no murmur, click, rub or gallop GI: soft, non-tender; bowel sounds normal; no masses,  no organomegaly Extremities: extremities normal, atraumatic, no cyanosis or edema  Lab Results:  Results for orders placed or performed during the hospital encounter of 06/01/15 (from the past 48 hour(s))  CBC with Differential/Platelet     Status: Abnormal   Collection Time: 06/05/15 10:03 AM  Result Value Ref Range   WBC 59.2 (HH) 4.0 - 10.5 K/uL    Comment: WHITE COUNT CONFIRMED ON SMEAR REPEATED TO VERIFY CRITICAL RESULT CALLED TO, READ BACK BY AND VERIFIED WITH: Covey Baller M. AT 1039A ON 112016 BY THOMPSON S.    RBC 3.38 (L) 4.22 - 5.81 MIL/uL   Hemoglobin 9.2 (L) 13.0 - 17.0 g/dL   HCT 29.4 (L) 39.0 - 52.0 %   MCV 87.0 78.0 - 100.0 fL   MCH 27.2 26.0 - 34.0 pg   MCHC 31.3 30.0 - 36.0 g/dL   RDW 14.5 11.5 - 15.5 %   Platelets 667 (H) 150 - 400 K/uL   Neutrophils Relative % 98 %   Lymphocytes Relative 1 %   Monocytes  Relative 1 %   Eosinophils Relative 0 %   Basophils Relative 0 %   Neutro Abs 58.0 (H) 1.7 - 7.7 K/uL   Lymphs Abs 0.6 (L) 0.7 - 4.0 K/uL   Monocytes Absolute 0.6 0.1 - 1.0 K/uL   Eosinophils Absolute 0.0 0.0 - 0.7 K/uL   Basophils Absolute 0.0 0.0 - 0.1 K/uL   RBC Morphology POLYCHROMASIA PRESENT     Comment: BASOPHILIC STIPPLING   Smear Review PLATELET COUNT CONFIRMED BY SMEAR   Vancomycin, trough     Status: Abnormal   Collection Time: 06/05/15 10:03 AM  Result Value Ref Range   Vancomycin Tr 8 (L) 10.0 - 20.0 ug/mL  Basic metabolic panel     Status: Abnormal   Collection Time: 06/06/15  5:54 AM  Result Value Ref Range   Sodium 136 135 - 145 mmol/L   Potassium 5.1 3.5 - 5.1 mmol/L   Chloride 88 (L) 101 - 111 mmol/L   CO2 39 (H) 22 - 32 mmol/L   Glucose, Bld 97 65 - 99 mg/dL   BUN 18 6 - 20 mg/dL   Creatinine, Ser 0.71 0.61 - 1.24 mg/dL   Calcium 9.5 8.9 - 10.3 mg/dL   GFR calc non Af Amer >60 >60 mL/min   GFR calc Af Amer >60 >60 mL/min    Comment: (  NOTE) The eGFR has been calculated using the CKD EPI equation. This calculation has not been validated in all clinical situations. eGFR's persistently <60 mL/min signify possible Chronic Kidney Disease.    Anion gap 9 5 - 15  CBC with Differential/Platelet     Status: Abnormal   Collection Time: 06/06/15  5:54 AM  Result Value Ref Range   WBC 62.6 (HH) 4.0 - 10.5 K/uL    Comment: WHITE COUNT CONFIRMED ON SMEAR RESULT REPEATED AND VERIFIED CRITICAL RESULT CALLED TO, READ BACK BY AND VERIFIED WITH: Danielle Dess RN ON [id number] AT 0745 BY RESSEGGER R    RBC 3.28 (L) 4.22 - 5.81 MIL/uL   Hemoglobin 8.9 (L) 13.0 - 17.0 g/dL   HCT 28.0 (L) 39.0 - 52.0 %   MCV 85.4 78.0 - 100.0 fL   MCH 27.1 26.0 - 34.0 pg   MCHC 31.8 30.0 - 36.0 g/dL   RDW 14.9 11.5 - 15.5 %   Platelets 650 (H) 150 - 400 K/uL    Comment: SPECIMEN CHECKED FOR CLOTS PLATELET COUNT CONFIRMED BY SMEAR    Neutrophils Relative % 99 %   Neutro Abs 61.8 (H) 1.7  - 7.7 K/uL   Lymphocytes Relative 1 %   Lymphs Abs 0.4 (L) 0.7 - 4.0 K/uL   Monocytes Relative 1 %   Monocytes Absolute 0.4 0.1 - 1.0 K/uL   Eosinophils Relative 0 %   Eosinophils Absolute 0.0 0.0 - 0.7 K/uL   Basophils Relative 0 %   Basophils Absolute 0.0 0.0 - 0.1 K/uL   Smear Review LARGE PLATELETS PRESENT     ABGS No results for input(s): PHART, PO2ART, TCO2, HCO3 in the last 72 hours.  Invalid input(s): PCO2 CULTURES No results found for this or any previous visit (from the past 240 hour(s)). Studies/Results: No results found.  Medications:  Prior to Admission:  Prescriptions prior to admission  Medication Sig Dispense Refill Last Dose  . albuterol (PROVENTIL HFA;VENTOLIN HFA) 108 (90 BASE) MCG/ACT inhaler Inhale 2 puffs into the lungs every 6 (six) hours as needed for wheezing or shortness of breath. 1 Inhaler 2 06/01/2015 at Unknown time  . dexamethasone (DECADRON) 4 MG tablet The day before chemo take 5 tabs in the am and 5 tabs in the pm. The morning of chemo take 5 tabs prior to chemo appt. 45 tablet 1 Past Month at Unknown time  . fluconazole (DIFLUCAN) 100 MG tablet Take 1 tablet (100 mg total) by mouth daily. 14 tablet 0 Past Week at Unknown time  . Fluticasone-Salmeterol (ADVAIR DISKUS) 250-50 MCG/DOSE AEPB Inhale 1 puff into the lungs 2 (two) times daily. 60 each 12 06/01/2015 at Unknown time  . ondansetron (ZOFRAN) 8 MG tablet Take 1 tablet (8 mg total) by mouth every 8 (eight) hours as needed for nausea or vomiting. 30 tablet 2 Past Month at Unknown time  . oxyCODONE-acetaminophen (PERCOCET/ROXICET) 5-325 MG tablet Take 1-2 tablets by mouth every 4 (four) hours as needed for moderate pain or severe pain. 100 tablet 0 06/01/2015 at Unknown time  . prochlorperazine (COMPAZINE) 10 MG tablet Take 1 tablet (10 mg total) by mouth every 6 (six) hours as needed for nausea or vomiting. 30 tablet 2 Past Month at Unknown time  . CARBOPLATIN IV Inject into the vein. Every 21  days     . lidocaine-prilocaine (EMLA) cream Apply a quarter size amount to port site 1 hour prior to chemo. Do not rub in. Cover with plastic wrap. 30 g 2   .  PACLitaxel (TAXOL IV) Inject into the vein. Every 21 days     . Pegfilgrastim (NEULASTA DELIVERY KIT [place]) Inject into the skin. Every 21 days      Scheduled: . albuterol  2.5 mg Nebulization Q4H  . antiseptic oral rinse  7 mL Mouth Rinse BID  . enoxaparin (LOVENOX) injection  40 mg Subcutaneous Q24H  . feeding supplement (ENSURE ENLIVE)  237 mL Oral BID BM  . filgrastim  300 mcg Subcutaneous Daily  . fluconazole  100 mg Oral Daily  . methylPREDNISolone (SOLU-MEDROL) injection  40 mg Intravenous Q12H  . mometasone-formoterol  2 puff Inhalation BID  . piperacillin-tazobactam (ZOSYN)  IV  3.375 g Intravenous Q8H  . vancomycin  1,250 mg Intravenous Q12H   Continuous:  CKI:CHTVGVSYV, morphine injection, oxyCODONE-acetaminophen, prochlorperazine, sodium chloride  Assesment: He was admitted with healthcare associated pneumonia versus metastatic adenocarcinoma of unknown primary. He is anxious but that seems to be related to his shortness of breath. He has a chronic malignant right pleural effusion but has Pleurx catheter in place. 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 Continue with current medications. Discontinue Xanax. Try morphine since it may help with the sense of dyspnea as well as anxiety    LOS: 5 days   Maury Bamba L 06/06/2015, 8:42 AM

## 2015-06-06 NOTE — Progress Notes (Signed)
Subjective:  Patient to continue to complain of shortness of breath. He is receiving IV antibiotics. He is started on chemotherapy by oncology.  Objective: Vital signs in last 24 hours: Temp:  [98 F (36.7 C)-98.4 F (36.9 C)] 98.4 F (36.9 C) (11/21 0628) Pulse Rate:  [97-98] 98 (11/21 0628) Resp:  [18-20] 18 (11/21 0628) BP: (127-140)/(69-79) 127/69 mmHg (11/21 0628) SpO2:  [94 %-99 %] 97 % (11/21 0037) Weight change:  Last BM Date: 06/02/15  Intake/Output from previous day: 11/20 0701 - 11/21 0700 In: -  Out: 220 [Urine:200; Chest Tube:20]  PHYSICAL EXAM General appearance: cachectic and moderate distress Resp: diminished breath sounds bilaterally and rhonchi bilaterally Cardio: S1, S2 normal GI: soft, non-tender; bowel sounds normal; no masses,  no organomegaly Extremities: extremities normal, atraumatic, no cyanosis or edema  Lab Results:  Results for orders placed or performed during the hospital encounter of 06/01/15 (from the past 48 hour(s))  CBC with Differential/Platelet     Status: Abnormal   Collection Time: 06/05/15 10:03 AM  Result Value Ref Range   WBC 59.2 (HH) 4.0 - 10.5 K/uL    Comment: WHITE COUNT CONFIRMED ON SMEAR REPEATED TO VERIFY CRITICAL RESULT CALLED TO, READ BACK BY AND VERIFIED WITH: HAWKINS M. AT 1039A ON 112016 BY THOMPSON S.    RBC 3.38 (L) 4.22 - 5.81 MIL/uL   Hemoglobin 9.2 (L) 13.0 - 17.0 g/dL   HCT 29.4 (L) 39.0 - 52.0 %   MCV 87.0 78.0 - 100.0 fL   MCH 27.2 26.0 - 34.0 pg   MCHC 31.3 30.0 - 36.0 g/dL   RDW 14.5 11.5 - 15.5 %   Platelets 667 (H) 150 - 400 K/uL   Neutrophils Relative % 98 %   Lymphocytes Relative 1 %   Monocytes Relative 1 %   Eosinophils Relative 0 %   Basophils Relative 0 %   Neutro Abs 58.0 (H) 1.7 - 7.7 K/uL   Lymphs Abs 0.6 (L) 0.7 - 4.0 K/uL   Monocytes Absolute 0.6 0.1 - 1.0 K/uL   Eosinophils Absolute 0.0 0.0 - 0.7 K/uL   Basophils Absolute 0.0 0.0 - 0.1 K/uL   RBC Morphology POLYCHROMASIA PRESENT      Comment: BASOPHILIC STIPPLING   Smear Review PLATELET COUNT CONFIRMED BY SMEAR   Vancomycin, trough     Status: Abnormal   Collection Time: 06/05/15 10:03 AM  Result Value Ref Range   Vancomycin Tr 8 (L) 10.0 - 20.0 ug/mL  Basic metabolic panel     Status: Abnormal   Collection Time: 06/06/15  5:54 AM  Result Value Ref Range   Sodium 136 135 - 145 mmol/L   Potassium 5.1 3.5 - 5.1 mmol/L   Chloride 88 (L) 101 - 111 mmol/L   CO2 39 (H) 22 - 32 mmol/L   Glucose, Bld 97 65 - 99 mg/dL   BUN 18 6 - 20 mg/dL   Creatinine, Ser 0.71 0.61 - 1.24 mg/dL   Calcium 9.5 8.9 - 10.3 mg/dL   GFR calc non Af Amer >60 >60 mL/min   GFR calc Af Amer >60 >60 mL/min    Comment: (NOTE) The eGFR has been calculated using the CKD EPI equation. This calculation has not been validated in all clinical situations. eGFR's persistently <60 mL/min signify possible Chronic Kidney Disease.    Anion gap 9 5 - 15  CBC with Differential/Platelet     Status: Abnormal   Collection Time: 06/06/15  5:54 AM  Result Value Ref Range  WBC 62.6 (HH) 4.0 - 10.5 K/uL    Comment: WHITE COUNT CONFIRMED ON SMEAR RESULT REPEATED AND VERIFIED CRITICAL RESULT CALLED TO, READ BACK BY AND VERIFIED WITH: Danielle Dess RN ON [id number] AT 0745 BY RESSEGGER R    RBC 3.28 (L) 4.22 - 5.81 MIL/uL   Hemoglobin 8.9 (L) 13.0 - 17.0 g/dL   HCT 28.0 (L) 39.0 - 52.0 %   MCV 85.4 78.0 - 100.0 fL   MCH 27.1 26.0 - 34.0 pg   MCHC 31.8 30.0 - 36.0 g/dL   RDW 14.9 11.5 - 15.5 %   Platelets 650 (H) 150 - 400 K/uL    Comment: SPECIMEN CHECKED FOR CLOTS PLATELET COUNT CONFIRMED BY SMEAR    Neutrophils Relative % 99 %   Neutro Abs 61.8 (H) 1.7 - 7.7 K/uL   Lymphocytes Relative 1 %   Lymphs Abs 0.4 (L) 0.7 - 4.0 K/uL   Monocytes Relative 1 %   Monocytes Absolute 0.4 0.1 - 1.0 K/uL   Eosinophils Relative 0 %   Eosinophils Absolute 0.0 0.0 - 0.7 K/uL   Basophils Relative 0 %   Basophils Absolute 0.0 0.0 - 0.1 K/uL   Smear Review LARGE  PLATELETS PRESENT     ABGS No results for input(s): PHART, PO2ART, TCO2, HCO3 in the last 72 hours.  Invalid input(s): PCO2 CULTURES No results found for this or any previous visit (from the past 240 hour(s)). Studies/Results: No results found.  Medications: I have reviewed the patient's current medications.  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    LOS: 5 days   Felcia Huebert 06/06/2015, 8:04 AM

## 2015-06-06 NOTE — Consult Note (Signed)
Consultation Note Date: 06/06/2015   Patient Name: Brian Nelson  DOB: Sep 14, 1960  MRN: [id number]  Age / Sex: 54 y.o., male   PCP: Avon Gully, MD Referring Physician: Avon Gully, MD  Reason for Consultation: Establishing goals of care and Psychosocial/spiritual support  Palliative Care Assessment and Plan Summary of Established Goals of Care and Medical Treatment Preferences   Clinical Assessment/Narrative: Brian Nelson is resting quietly in his bed.  His former girlfriend/caregiver Brian Nelson is at his bedside.  We talk about his current health conditions and prognosis, and Brian Nelson tells me that she thought Brian Nelson cancer was stage 1.  I ask if I can share what I know about his illness, and Brian Nelson agrees. I share that the cancer is stage 4 and Brian Nelson stands up, paces the room crying, stating "no, no".  She ask Brian Nelson why he didn't tell her this.   I share the CT results and the suspicion that the cancer is in his bone. We talk about his chemo and Brian Nelson states that he understands "as long as I take chemo, I will live."  I share that the chemo will not cure his cancer and he will likely die from this cancer.  Brian Nelson states that he doesn't believe this.  We talk about his PNE, and lung issues that will be re occuring related to cancer.   Brian Nelson states several times that what is important to him is that he is able to get his breath.  We talk about symptom management and the use of anxiolytics.  Brian Nelson tells me that he was 'up and down' last night after xanax and they feel this had a negative reaction for him.  We talk about the use of morphine to help with dyspnea, (opening the blood vessels in the heart and lungs), and the side effects of sleepiness, nausea, and constipation.  Brian Nelson tells me that Brian Nelson has not had a BM in 7 days.  Suppository ordered/ senna s/ and prn enema.  We discuss the use of stool softeners ongoing with narcotics.    We talk about functional  status at home, and they tell me that he "not getting around good".  We talk about cancer being a thief.  Brian Nelson remarks on his decreased appetite.  Brian Nelson, at times, seems to "drift away" briefly. I share that this is very difficult to hear and that I'm glad he has someone to support him.  I reassure him that we are doing all we can for him, and that he is doing all he can also. He thanks me for visiting with them.   Contacts/Participants in Discussion: Primary Decision Maker: At this time Brian Nelson is able to make his own choices.    HCPOA: no, We talk about the importance of having a HCPOA to make medical choices as he directs, not d/t their wishes. He shares that he would want his mother Brian Nelson to be his HCPOA.   Code Status/Advance Care Planning:  FULL CODE  I share the realities of chest compressions, defibrillation, and intubation.   We discuss the progression of cancer may change his wishes as he becomes weaker.   Symptom Management:   Morphine 2 mg IV Q 2 hours PRN.   Palliative Prophylaxis: Senna S 2 tabs BID  Dulcolax 10 mg PRN.   Enema PRN  Psycho-social/Spiritual:   Support System: Lives with his mother.  One sister Brian Nelson  Desire for further Chaplaincy support:Not discussed today.  Prognosis: < 3 months  Discharge Planning:  Likely home with [facility] services.        Chief Complaint:  Dyspnea History of Present Illness:  Brian Nelson is a 54 y.o. 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 He has had no cough or fever. He denies any chest pain or palpitations. There is no hemoptysis. Evaluation in the emergency room showed him to desaturate on room air and a CT scan of his chest shows possible new infiltrate in the left lung, possibly indicating metastatic spread of tumor versus infectious process. He was admitted for further  management.  Primary Diagnoses  Present on Admission:  . Dyspnea . 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 Malnutrition of moderate degree . COPD (chronic obstructive pulmonary disease) ([place]) . HCAP (healthcare-associated pneumonia) . Protein-calorie malnutrition, severe . Acute respiratory failure with hypoxia (HCC)  Palliative Review of Systems: Brian Nelson denies uncontrolled pain, anxiety, dyspnea or NV I have reviewed the medical record, interviewed the patient and family, and examined the patient. The following aspects are pertinent.  Past Medical History  Diagnosis Date  . Tobacco use disorder 04/23/2015    Quit Fall 2016  . ETOH abuse     Quit Fall 2016  . Illicit drug use     Quit Fall 2016  . On home O2   . 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 Pleural effusion on right    Social History   Social History  . Marital Status: Married    Spouse Name: N/A  . Number of Children: N/A  . Years of Education: N/A   Occupational History  . Concrete work    Social History Main Topics  . Smoking status: Former Smoker -- 2.00 packs/day for 40 years    Types: Cigarettes    Quit date: 04/22/2015  . Smokeless tobacco: Never Used  . Alcohol Use: 3.6 oz/week    6 Cans of beer per week     Comment: Quit in fall 2016  . Drug Use: Yes    Special: Cocaine, Marijuana     Comment: Quit in Fall 2016  . Sexual Activity: Not Asked   Other Topics Concern  . None   Social History Narrative   Family History  Problem Relation Age of Onset  . Diabetes Mother    Scheduled Meds: . albuterol  2.5 mg Nebulization Q4H  . antiseptic oral rinse  7 mL Mouth Rinse BID  . enoxaparin (LOVENOX) injection  40 mg Subcutaneous Q24H  . feeding supplement (ENSURE ENLIVE)  237 mL Oral BID BM  . filgrastim  300 mcg Subcutaneous Daily  .  fluconazole  100 mg Oral Daily  . methylPREDNISolone (SOLU-MEDROL) injection  40 mg Intravenous Q12H  . mometasone-formoterol  2 puff Inhalation BID  . piperacillin-tazobactam (ZOSYN)  IV  3.375 g Intravenous Q8H  . senna-docusate  2 tablet Oral BID  . vancomycin  1,250 mg Intravenous Q12H   Continuous Infusions:  PRN Meds:.albuterol, bisacodyl, mineral oil, morphine injection, oxyCODONE-acetaminophen, prochlorperazine, sodium chloride Medications Prior to Admission:  Prior to Admission medications   Medication Sig Start Date End Date Taking? Authorizing Provider  albuterol (PROVENTIL HFA;VENTOLIN HFA) 108 (90 BASE) MCG/ACT inhaler Inhale 2 puffs into the lungs every 6 (six) hours as needed for wheezing or shortness of breath.  05/26/15  Yes Manon Hilding Kefalas, PA-C  dexamethasone (DECADRON) 4 MG tablet The day before chemo take 5 tabs in the am and 5 tabs in the pm. The morning of chemo take 5 tabs prior to chemo appt. 06/01/15  Yes Patrici Ranks, MD  fluconazole (DIFLUCAN) 100 MG tablet Take 1 tablet (100 mg total) by mouth daily. 05/19/15  Yes Patrici Ranks, MD  Fluticasone-Salmeterol (ADVAIR DISKUS) 250-50 MCG/DOSE AEPB Inhale 1 puff into the lungs 2 (two) times daily. 05/14/15  Yes Sinda Du, MD  ondansetron (ZOFRAN) 8 MG tablet Take 1 tablet (8 mg total) by mouth every 8 (eight) hours as needed for nausea or vomiting. 06/01/15  Yes Patrici Ranks, MD  oxyCODONE-acetaminophen (PERCOCET/ROXICET) 5-325 MG tablet Take 1-2 tablets by mouth every 4 (four) hours as needed for moderate pain or severe pain. 05/26/15  Yes Baird Cancer, PA-C  prochlorperazine (COMPAZINE) 10 MG tablet Take 1 tablet (10 mg total) by mouth every 6 (six) hours as needed for nausea or vomiting. 06/01/15  Yes Patrici Ranks, MD  CARBOPLATIN IV Inject into the vein. Every 21 days    Historical Provider, MD  lidocaine-prilocaine (EMLA) cream Apply a quarter size amount to port site 1 hour prior to chemo. Do  not rub in. Cover with plastic wrap. 06/01/15   Patrici Ranks, MD  PACLitaxel (TAXOL IV) Inject into the vein. Every 21 days    Historical Provider, MD  Pegfilgrastim (NEULASTA DELIVERY KIT [place]) Inject into the skin. Every 21 days    Historical Provider, MD   No Known Allergies CBC:    Component Value Date/Time   WBC 62.6* 06/06/2015 0554   HGB 8.9* 06/06/2015 0554   HCT 28.0* 06/06/2015 0554   PLT 650* 06/06/2015 0554   MCV 85.4 06/06/2015 0554   NEUTROABS 61.8* 06/06/2015 0554   LYMPHSABS 0.4* 06/06/2015 0554   MONOABS 0.4 06/06/2015 0554   EOSABS 0.0 06/06/2015 0554   BASOSABS 0.0 06/06/2015 0554   Comprehensive Metabolic Panel:    Component Value Date/Time   NA 136 06/06/2015 0554   K 5.1 06/06/2015 0554   CL 88* 06/06/2015 0554   CO2 39* 06/06/2015 0554   BUN 18 06/06/2015 0554   CREATININE 0.71 06/06/2015 0554   GLUCOSE 97 06/06/2015 0554   CALCIUM 9.5 06/06/2015 0554   AST 20 06/03/2015 0915   ALT 10* 06/03/2015 0915   ALKPHOS 103 06/03/2015 0915   BILITOT 0.2* 06/03/2015 0915   PROT 7.0 06/03/2015 0915   ALBUMIN 2.3* 06/03/2015 0915    Physical Exam: Vital Signs: BP 124/72 mmHg  Pulse 105  Temp(Src) 97.7 F (36.5 C) (Oral)  Resp 18  Ht $R'5\' 8"'lE$  (1.727 m)  Wt 55.5 kg (122 lb 5.7 oz)  BMI 18.61 kg/m2  SpO2 95% SpO2: SpO2: 95 % O2 Device: O2 Device: High Flow Nasal Cannula O2 Flow Rate: O2 Flow Rate (L/min): 9 L/min Intake/output summary:  Intake/Output Summary (Last 24 hours) at 06/06/15 1601 Last data filed at 06/06/15 1438  Gross per 24 hour  Intake    480 ml  Output    220 ml  Net    260 ml   LBM: Last BM Date: 06/02/15 Baseline Weight: Weight: 55.5 kg (122 lb 5.7 oz) Most recent weight: Weight: 55.5 kg (122 lb 5.7 oz)  Exam Findings:  Constitutional:  Frail, thin, ill appearing.  Resp:  Pleurex to drainage. Able to talk normally with high flow O2 Cardio:  No edema Psych: makes/  keeps eye contact, difficult to understand speech.            Palliative Performance Scale: 40%              Additional Data Reviewed: Recent Labs     06/05/15  1003  06/06/15  0554  WBC  59.2*  62.6*  HGB  9.2*  8.9*  PLT  667*  650*  NA   --   136  BUN   --   18  CREATININE   --   0.71     Time In: 1410 Time Out: 1500 Time Total: 50 minutes Greater than 50%  of this time was spent counseling and coordinating care related to the above assessment and plan. [place] discussion shared with nursing staff, CM, SW, and will share with Dr. Legrand Rams on next rounds.   Signed by: Drue Novel, NP  Drue Novel, NP  06/06/2015, 4:01 PM  Please contact Palliative Medicine Team phone at [phone] for questions and concerns.

## 2015-06-07 ENCOUNTER — Encounter: Payer: Self-pay | Admitting: *Deleted

## 2015-06-07 DIAGNOSIS — E440 Moderate protein-calorie malnutrition: Secondary | ICD-10-CM

## 2015-06-07 DIAGNOSIS — C799 Secondary malignant neoplasm of unspecified site: Secondary | ICD-10-CM | POA: Insufficient documentation

## 2015-06-07 NOTE — Progress Notes (Signed)
[facility] Psychosocial Distress Screening Clinical Social Work  Clinical Social Work was referred by distress screening protocol.  The patient scored a 6 on the Psychosocial Distress Thermometer which indicates moderate distress. Clinical Social Worker spoke with pt's mother on 11/16  to assess for distress and other psychosocial needs. CSW provided family with resources for transportation on that date. Pt currently admitted to the hospital. CSW will follow along and attempt to meet with pt at upcoming [facility] appointments.   ONCBCN DISTRESS SCREENING 06/03/2015  Screening Type Initial Screening  Distress experienced in past week (1-10) 6  Practical problem type Transportation  Emotional problem type Depression  Physical Problem type Getting around;Bathing/dressing;Breathing;Talking;Skin dry/itchy  Referral to clinical social work Yes    Clinical Social Worker follow up needed: Yes.    If yes, follow up plan: See above Loren Racer, [place] Tuesdays   [phone]

## 2015-06-07 NOTE — Progress Notes (Signed)
Daily Progress Note   Patient Name: Brian Nelson       Date: 06/07/2015 DOB: 09-Mar-1961  Age: 54 y.o. MRN#: [id number] Attending Physician: Rosita Fire, MD Primary Care Physician: Rosita Fire, MD Admit Date: 06/01/2015  Reason for Consultation/Follow-up: Establishing goals of care and Psychosocial/spiritual support  Subjective: Meeting today with [name] and his caregiver/former girlfriend [place].  [name] states at first that he doesn't feel like talking today, but then tells me about his family gathering around him last night praying.  He tells me, "God knows."  I share that I have talked with Gershon Mussel, PA in cancer center, and the plan is to have chemo every 3 weeks, and check his scans before the third scan.  We talk about his endoscopy and not being able to get a good sample for testing.  At this he looks at [name] and states, "see, I told you".  I share that his ability to function will be a good indicator of his response to cancer treatments.  We talk about symptom management, and he denies pain, dyspnea, NV.  He tells me that he has not had a BM ( today is day 8), and that he was to get a suppository last night but asked them to wait, and he didn't get one.  We talk about the importance of getting his bowels to move and he tells me that he has passed gas, but agrees to suppository.   Length of Stay: 6 days  Current Medications: Scheduled Meds:  . albuterol  2.5 mg Nebulization Q4H  . antiseptic oral rinse  7 mL Mouth Rinse BID  . enoxaparin (LOVENOX) injection  40 mg Subcutaneous Q24H  . feeding supplement (ENSURE ENLIVE)  237 mL Oral BID BM  . filgrastim  300 mcg Subcutaneous Daily  . fluconazole  100 mg Oral Daily  . methylPREDNISolone (SOLU-MEDROL) injection  40 mg Intravenous Q12H  . mometasone-formoterol  2 puff Inhalation BID  . piperacillin-tazobactam (ZOSYN)  IV  3.375 g Intravenous Q8H  . senna-docusate  2 tablet Oral BID  . vancomycin  1,250 mg Intravenous Q12H      Continuous Infusions:    PRN Meds: albuterol, bisacodyl, mineral oil, morphine injection, oxyCODONE-acetaminophen, prochlorperazine, sodium chloride  Palliative Performance Scale: 40%     Vital Signs: BP 141/73 mmHg  Pulse 103  Temp(Src) 98.4 F (36.9 C) (Oral)  Resp 18  Ht '5\' 8"'$  (1.727 m)  Wt 55.5 kg (122 lb 5.7 oz)  BMI 18.61 kg/m2  SpO2 92% SpO2: SpO2: 92 % O2 Device: O2 Device: High Flow Nasal Cannula O2 Flow Rate: O2 Flow Rate (L/min): 7 L/min  Intake/output summary:  Intake/Output Summary (Last 24 hours) at 06/07/15 1439 Last data filed at 06/07/15 1418  Gross per 24 hour  Intake    480 ml  Output      0 ml  Net    480 ml   LBM:   Baseline Weight: Weight: 55.5 kg (122 lb 5.7 oz) Most recent weight: Weight: 55.5 kg (122 lb 5.7 oz)             Additional Data Reviewed: Recent Labs     06/05/15  1003  06/06/15  0554  WBC  59.2*  62.6*  HGB  9.2*  8.9*  PLT  667*  650*  NA   --   136  BUN   --   18  CREATININE   --   0.71     Problem List:  Patient  Active Problem List   Diagnosis Date Noted  . Pleural effusion, left   . Palliative care encounter   . DNR (do not resuscitate) discussion   . COPD (chronic obstructive pulmonary disease) ([place]) 06/02/2015  . HCAP (healthcare-associated pneumonia) 06/02/2015  . Protein-calorie malnutrition, severe 06/02/2015  . Acute respiratory failure with hypoxia ([place]) 06/02/2015  . Dyspnea 06/01/2015  . Anemia 05/19/2015  . 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 Recurrent pleural effusion on right 05/08/2015  . Pleural effusion 05/08/2015  . ETOH abuse   . Drug abuse   . Shortness of breath 05/03/2015  . Cancer ([place])   . Cavitating mass in right upper lung lobe   . Leukocytosis 04/24/2015  . S/P thoracentesis   . Malnutrition of moderate degree 04/23/2015  . COPD exacerbation ([place]) 04/23/2015  . CAP (community acquired  pneumonia) 04/23/2015  . Tobacco use disorder 04/23/2015  . Alcohol dependence ([place]) 04/23/2015  . Pleural effusion on right 04/22/2015     Palliative Care Assessment & Plan    Code Status:  Full code  Goals of Care:  Return to health   Symptom Management:  Morphine 2 mg IV Q 2 hours PRN.   Palliative Prophylaxis:  Senna S 2 tabs BID  Dulcolax 10 mg PRN.   Enema PRN   Prognosis: Unable to determine, less than 2 months without treatment;  with chemo unknown.  Discharge Planning: Home with [place] was discussed with nursing staff, [facility], SW, and will share with Dr. Legrand Rams on next rounds.   Thank you for allowing the Palliative Medicine Team to assist in the care of this patient.   Time In: 1000 Time Out: 1030 Total Time 30 minutes Prolonged Time Billed  no     Greater than 50%  of this time was spent counseling and coordinating care related to the above assessment and plan.   Drue Novel, NP  06/07/2015, 2:39 PM  Please contact Palliative Medicine Team phone at [phone] for questions and concerns.

## 2015-06-07 NOTE — Progress Notes (Signed)
Subjective:  Patient has difficulty to breath. His chest x-ray showed increased right pleural effusion and increased air space opacification of the left lung.   Objective: Vital signs in last 24 hours: Temp:  [97.7 F (36.5 C)] 97.7 F (36.5 C) (11/21 1436) Pulse Rate:  [105] 105 (11/21 1436) Resp:  [18] 18 (11/21 1436) BP: (124)/(72) 124/72 mmHg (11/21 1436) SpO2:  [91 %-98 %] 94 % (11/22 0338) Weight change:  Last BM Date: 06/02/15  Intake/Output from previous day: 11/21 0701 - 11/22 0700 In: 600 [P.O.:600] Out: -   PHYSICAL EXAM General appearance: cachectic and moderate distress Resp: diminished breath sounds bilaterally and rhonchi bilaterally Cardio: S1, S2 normal GI: soft, non-tender; bowel sounds normal; no masses,  no organomegaly Extremities: extremities normal, atraumatic, no cyanosis or edema  Lab Results:  Results for orders placed or performed during the hospital encounter of 06/01/15 (from the past 48 hour(s))  CBC with Differential/Platelet     Status: Abnormal   Collection Time: 06/05/15 10:03 AM  Result Value Ref Range   WBC 59.2 (HH) 4.0 - 10.5 K/uL    Comment: WHITE COUNT CONFIRMED ON SMEAR REPEATED TO VERIFY CRITICAL RESULT CALLED TO, READ BACK BY AND VERIFIED WITH: HAWKINS M. AT 1039A ON 112016 BY THOMPSON S.    RBC 3.38 (L) 4.22 - 5.81 MIL/uL   Hemoglobin 9.2 (L) 13.0 - 17.0 g/dL   HCT 29.4 (L) 39.0 - 52.0 %   MCV 87.0 78.0 - 100.0 fL   MCH 27.2 26.0 - 34.0 pg   MCHC 31.3 30.0 - 36.0 g/dL   RDW 14.5 11.5 - 15.5 %   Platelets 667 (H) 150 - 400 K/uL   Neutrophils Relative % 98 %   Lymphocytes Relative 1 %   Monocytes Relative 1 %   Eosinophils Relative 0 %   Basophils Relative 0 %   Neutro Abs 58.0 (H) 1.7 - 7.7 K/uL   Lymphs Abs 0.6 (L) 0.7 - 4.0 K/uL   Monocytes Absolute 0.6 0.1 - 1.0 K/uL   Eosinophils Absolute 0.0 0.0 - 0.7 K/uL   Basophils Absolute 0.0 0.0 - 0.1 K/uL   RBC Morphology POLYCHROMASIA PRESENT     Comment: BASOPHILIC  STIPPLING   Smear Review PLATELET COUNT CONFIRMED BY SMEAR   Vancomycin, trough     Status: Abnormal   Collection Time: 06/05/15 10:03 AM  Result Value Ref Range   Vancomycin Tr 8 (L) 10.0 - 20.0 ug/mL  Basic metabolic panel     Status: Abnormal   Collection Time: 06/06/15  5:54 AM  Result Value Ref Range   Sodium 136 135 - 145 mmol/L   Potassium 5.1 3.5 - 5.1 mmol/L   Chloride 88 (L) 101 - 111 mmol/L   CO2 39 (H) 22 - 32 mmol/L   Glucose, Bld 97 65 - 99 mg/dL   BUN 18 6 - 20 mg/dL   Creatinine, Ser 0.71 0.61 - 1.24 mg/dL   Calcium 9.5 8.9 - 10.3 mg/dL   GFR calc non Af Amer >60 >60 mL/min   GFR calc Af Amer >60 >60 mL/min    Comment: (NOTE) The eGFR has been calculated using the CKD EPI equation. This calculation has not been validated in all clinical situations. eGFR's persistently <60 mL/min signify possible Chronic Kidney Disease.    Anion gap 9 5 - 15  CBC with Differential/Platelet     Status: Abnormal   Collection Time: 06/06/15  5:54 AM  Result Value Ref Range   WBC  62.6 (HH) 4.0 - 10.5 K/uL    Comment: WHITE COUNT CONFIRMED ON SMEAR RESULT REPEATED AND VERIFIED CRITICAL RESULT CALLED TO, READ BACK BY AND VERIFIED WITH: Danielle Dess RN ON [phone] AT 0745 BY RESSEGGER R    RBC 3.28 (L) 4.22 - 5.81 MIL/uL   Hemoglobin 8.9 (L) 13.0 - 17.0 g/dL   HCT 28.0 (L) 39.0 - 52.0 %   MCV 85.4 78.0 - 100.0 fL   MCH 27.1 26.0 - 34.0 pg   MCHC 31.8 30.0 - 36.0 g/dL   RDW 14.9 11.5 - 15.5 %   Platelets 650 (H) 150 - 400 K/uL    Comment: SPECIMEN CHECKED FOR CLOTS PLATELET COUNT CONFIRMED BY SMEAR    Neutrophils Relative % 99 %   Neutro Abs 61.8 (H) 1.7 - 7.7 K/uL   Lymphocytes Relative 1 %   Lymphs Abs 0.4 (L) 0.7 - 4.0 K/uL   Monocytes Relative 1 %   Monocytes Absolute 0.4 0.1 - 1.0 K/uL   Eosinophils Relative 0 %   Eosinophils Absolute 0.0 0.0 - 0.7 K/uL   Basophils Relative 0 %   Basophils Absolute 0.0 0.0 - 0.1 K/uL   Smear Review LARGE PLATELETS PRESENT      ABGS No results for input(s): PHART, PO2ART, TCO2, HCO3 in the last 72 hours.  Invalid input(s): PCO2 CULTURES No results found for this or any previous visit (from the past 240 hour(s)). Studies/Results: Dg Chest Port 1 View  06/06/2015  CLINICAL DATA:  Shortness of breath. EXAM: PORTABLE CHEST 1 VIEW COMPARISON:  06/01/2015. FINDINGS: There is a right chest wall port a catheter with tip in the SVC. There is progressive worsening aeration to the right lung likely reflecting increase and loculated right effusion. Progressive airspace opacification within the left midlung and left lower lobe. Large emphysematous bulla is again noted within the left apex. IMPRESSION: 1. Suspect increase in right pleural effusion 2. Increase in airspace opacification within the left midlung and left base. Electronically Signed   By: Kerby Moors M.D.   On: 06/06/2015 09:23    Medications: I have reviewed the patient's current medications.  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

## 2015-06-07 NOTE — Progress Notes (Signed)
Subjective: He says he feels a little better. His chest x-ray looked worse yesterday.  Objective: Vital signs in last 24 hours: Temp:  [97.7 F (36.5 C)] 97.7 F (36.5 C) (11/21 1436) Pulse Rate:  [105] 105 (11/21 1436) Resp:  [18] 18 (11/21 1436) BP: (124)/(72) 124/72 mmHg (11/21 1436) SpO2:  [91 %-98 %] 92 % (11/22 0820) Weight change:  Last BM Date: 06/02/15  Intake/Output from previous day: 11/21 0701 - 11/22 0700 In: 600 [P.O.:600] Out: -   PHYSICAL EXAM General appearance: alert, cooperative and mild distress Resp: Diminished breath sounds on the right and rhonchi on the left Cardio: regular rate and rhythm, S1, S2 normal, no murmur, click, rub or gallop GI: soft, non-tender; bowel sounds normal; no masses,  no organomegaly Extremities: extremities normal, atraumatic, no cyanosis or edema  Lab Results:  Results for orders placed or performed during the hospital encounter of 06/01/15 (from the past 48 hour(s))  CBC with Differential/Platelet     Status: Abnormal   Collection Time: 06/05/15 10:03 AM  Result Value Ref Range   WBC 59.2 (HH) 4.0 - 10.5 K/uL    Comment: WHITE COUNT CONFIRMED ON SMEAR REPEATED TO VERIFY CRITICAL RESULT CALLED TO, READ BACK BY AND VERIFIED WITH: HAWKINS M. AT 1039A ON 112016 BY THOMPSON S.    RBC 3.38 (L) 4.22 - 5.81 MIL/uL   Hemoglobin 9.2 (L) 13.0 - 17.0 g/dL   HCT 29.4 (L) 39.0 - 52.0 %   MCV 87.0 78.0 - 100.0 fL   MCH 27.2 26.0 - 34.0 pg   MCHC 31.3 30.0 - 36.0 g/dL   RDW 14.5 11.5 - 15.5 %   Platelets 667 (H) 150 - 400 K/uL   Neutrophils Relative % 98 %   Lymphocytes Relative 1 %   Monocytes Relative 1 %   Eosinophils Relative 0 %   Basophils Relative 0 %   Neutro Abs 58.0 (H) 1.7 - 7.7 K/uL   Lymphs Abs 0.6 (L) 0.7 - 4.0 K/uL   Monocytes Absolute 0.6 0.1 - 1.0 K/uL   Eosinophils Absolute 0.0 0.0 - 0.7 K/uL   Basophils Absolute 0.0 0.0 - 0.1 K/uL   RBC Morphology POLYCHROMASIA PRESENT     Comment: BASOPHILIC STIPPLING   Smear Review PLATELET COUNT CONFIRMED BY SMEAR   Vancomycin, trough     Status: Abnormal   Collection Time: 06/05/15 10:03 AM  Result Value Ref Range   Vancomycin Tr 8 (L) 10.0 - 20.0 ug/mL  Basic metabolic panel     Status: Abnormal   Collection Time: 06/06/15  5:54 AM  Result Value Ref Range   Sodium 136 135 - 145 mmol/L   Potassium 5.1 3.5 - 5.1 mmol/L   Chloride 88 (L) 101 - 111 mmol/L   CO2 39 (H) 22 - 32 mmol/L   Glucose, Bld 97 65 - 99 mg/dL   BUN 18 6 - 20 mg/dL   Creatinine, Ser 0.71 0.61 - 1.24 mg/dL   Calcium 9.5 8.9 - 10.3 mg/dL   GFR calc non Af Amer >60 >60 mL/min   GFR calc Af Amer >60 >60 mL/min    Comment: (NOTE) The eGFR has been calculated using the CKD EPI equation. This calculation has not been validated in all clinical situations. eGFR's persistently <60 mL/min signify possible Chronic Kidney Disease.    Anion gap 9 5 - 15  CBC with Differential/Platelet     Status: Abnormal   Collection Time: 06/06/15  5:54 AM  Result Value Ref Range  WBC 62.6 (HH) 4.0 - 10.5 K/uL    Comment: WHITE COUNT CONFIRMED ON SMEAR RESULT REPEATED AND VERIFIED CRITICAL RESULT CALLED TO, READ BACK BY AND VERIFIED WITH: Danielle Dess RN ON [id number] AT 0745 BY RESSEGGER R    RBC 3.28 (L) 4.22 - 5.81 MIL/uL   Hemoglobin 8.9 (L) 13.0 - 17.0 g/dL   HCT 28.0 (L) 39.0 - 52.0 %   MCV 85.4 78.0 - 100.0 fL   MCH 27.1 26.0 - 34.0 pg   MCHC 31.8 30.0 - 36.0 g/dL   RDW 14.9 11.5 - 15.5 %   Platelets 650 (H) 150 - 400 K/uL    Comment: SPECIMEN CHECKED FOR CLOTS PLATELET COUNT CONFIRMED BY SMEAR    Neutrophils Relative % 99 %   Neutro Abs 61.8 (H) 1.7 - 7.7 K/uL   Lymphocytes Relative 1 %   Lymphs Abs 0.4 (L) 0.7 - 4.0 K/uL   Monocytes Relative 1 %   Monocytes Absolute 0.4 0.1 - 1.0 K/uL   Eosinophils Relative 0 %   Eosinophils Absolute 0.0 0.0 - 0.7 K/uL   Basophils Relative 0 %   Basophils Absolute 0.0 0.0 - 0.1 K/uL   Smear Review LARGE PLATELETS PRESENT     ABGS No results  for input(s): PHART, PO2ART, TCO2, HCO3 in the last 72 hours.  Invalid input(s): PCO2 CULTURES No results found for this or any previous visit (from the past 240 hour(s)). Studies/Results: Dg Chest Port 1 View  06/06/2015  CLINICAL DATA:  Shortness of breath. EXAM: PORTABLE CHEST 1 VIEW COMPARISON:  06/01/2015. FINDINGS: There is a right chest wall port a catheter with tip in the SVC. There is progressive worsening aeration to the right lung likely reflecting increase and loculated right effusion. Progressive airspace opacification within the left midlung and left lower lobe. Large emphysematous bulla is again noted within the left apex. IMPRESSION: 1. Suspect increase in right pleural effusion 2. Increase in airspace opacification within the left midlung and left base. Electronically Signed   By: Kerby Moors M.D.   On: 06/06/2015 09:23    Medications:  Prior to Admission:  Prescriptions prior to admission  Medication Sig Dispense Refill Last Dose  . albuterol (PROVENTIL HFA;VENTOLIN HFA) 108 (90 BASE) MCG/ACT inhaler Inhale 2 puffs into the lungs every 6 (six) hours as needed for wheezing or shortness of breath. 1 Inhaler 2 06/01/2015 at Unknown time  . dexamethasone (DECADRON) 4 MG tablet The day before chemo take 5 tabs in the am and 5 tabs in the pm. The morning of chemo take 5 tabs prior to chemo appt. 45 tablet 1 Past Month at Unknown time  . fluconazole (DIFLUCAN) 100 MG tablet Take 1 tablet (100 mg total) by mouth daily. 14 tablet 0 Past Week at Unknown time  . Fluticasone-Salmeterol (ADVAIR DISKUS) 250-50 MCG/DOSE AEPB Inhale 1 puff into the lungs 2 (two) times daily. 60 each 12 06/01/2015 at Unknown time  . ondansetron (ZOFRAN) 8 MG tablet Take 1 tablet (8 mg total) by mouth every 8 (eight) hours as needed for nausea or vomiting. 30 tablet 2 Past Month at Unknown time  . oxyCODONE-acetaminophen (PERCOCET/ROXICET) 5-325 MG tablet Take 1-2 tablets by mouth every 4 (four) hours as  needed for moderate pain or severe pain. 100 tablet 0 06/01/2015 at Unknown time  . prochlorperazine (COMPAZINE) 10 MG tablet Take 1 tablet (10 mg total) by mouth every 6 (six) hours as needed for nausea or vomiting. 30 tablet 2 Past Month at Unknown time  .  CARBOPLATIN IV Inject into the vein. Every 21 days     . lidocaine-prilocaine (EMLA) cream Apply a quarter size amount to port site 1 hour prior to chemo. Do not rub in. Cover with plastic wrap. 30 g 2   . PACLitaxel (TAXOL IV) Inject into the vein. Every 21 days     . Pegfilgrastim (NEULASTA DELIVERY KIT [place]) Inject into the skin. Every 21 days      Scheduled: . albuterol  2.5 mg Nebulization Q4H  . antiseptic oral rinse  7 mL Mouth Rinse BID  . enoxaparin (LOVENOX) injection  40 mg Subcutaneous Q24H  . feeding supplement (ENSURE ENLIVE)  237 mL Oral BID BM  . filgrastim  300 mcg Subcutaneous Daily  . fluconazole  100 mg Oral Daily  . methylPREDNISolone (SOLU-MEDROL) injection  40 mg Intravenous Q12H  . mometasone-formoterol  2 puff Inhalation BID  . piperacillin-tazobactam (ZOSYN)  IV  3.375 g Intravenous Q8H  . senna-docusate  2 tablet Oral BID  . vancomycin  1,250 mg Intravenous Q12H   Continuous:  [id number], bisacodyl, mineral oil, morphine injection, oxyCODONE-acetaminophen, prochlorperazine, sodium chloride  Assesment: He was admitted with healthcare associated pneumonia versus metastatic cancer. He has adenocarcinoma of an unknown primary and has received the first round of chemotherapy. At baseline he has COPD. 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 I don't think there is really anything to add at this time. Continue current medications    LOS: 6 days   HAWKINS,EDWARD L 06/07/2015, 9:05 AM

## 2015-06-07 NOTE — Care Management Note (Signed)
Case Management Note  Patient Details  Name: Brian Nelson MRN: [id number] Date of Birth: 06-Apr-1961  Subjective/Objective:                    Action/Plan:   Expected Discharge Date:                  Expected Discharge Plan:  [place]  In-House Referral:  Hospice / Palliative Care  Discharge planning Services  CM Consult  Post Acute Care Choice:  Resumption of Svcs/PTA Provider Choice offered to:  Patient  DME Arranged:    DME Agency:     HH Arranged:  RN [place] Agency:  [facility]  Status of Service:  In process, will continue to follow  Medicare Important Message Given:    Date Medicare IM Given:    Medicare IM give by:    Date Additional Medicare IM Given:    Additional Medicare Important Message give by:     If discussed at [place] of Stay Meetings, dates discussed:  06/07/2015  Additional Comments:  Sherald Barge, RN 06/07/2015, 11:52 AM

## 2015-06-08 ENCOUNTER — Ambulatory Visit (HOSPITAL_COMMUNITY): Payer: Medicaid Other | Admitting: Oncology

## 2015-06-08 ENCOUNTER — Other Ambulatory Visit (HOSPITAL_COMMUNITY): Payer: Self-pay | Admitting: Oncology

## 2015-06-08 ENCOUNTER — Other Ambulatory Visit (HOSPITAL_COMMUNITY): Payer: Medicaid Other

## 2015-06-08 ENCOUNTER — Encounter (HOSPITAL_BASED_OUTPATIENT_CLINIC_OR_DEPARTMENT_OTHER): Payer: Medicaid Other

## 2015-06-08 ENCOUNTER — Encounter (HOSPITAL_COMMUNITY): Payer: Self-pay

## 2015-06-08 VITALS — BP 130/62 | HR 95 | Temp 98.4°F | Resp 22

## 2015-06-08 DIAGNOSIS — R0600 Dyspnea, unspecified: Secondary | ICD-10-CM

## 2015-06-08 DIAGNOSIS — Z5189 Encounter for other specified aftercare: Secondary | ICD-10-CM

## 2015-06-08 DIAGNOSIS — C801 Malignant (primary) neoplasm, unspecified: Secondary | ICD-10-CM

## 2015-06-08 LAB — CBC
HEMATOCRIT: 25.1 % — AB (ref 39.0–52.0)
Hemoglobin: 8.5 g/dL — ABNORMAL LOW (ref 13.0–17.0)
MCH: 28 pg (ref 26.0–34.0)
MCHC: 33.9 g/dL (ref 30.0–36.0)
MCV: 82.6 fL (ref 78.0–100.0)
PLATELETS: 633 10*3/uL — AB (ref 150–400)
RBC: 3.04 MIL/uL — ABNORMAL LOW (ref 4.22–5.81)
RDW: 15.2 % (ref 11.5–15.5)
WBC: 50.9 10*3/uL — AB (ref 4.0–10.5)

## 2015-06-08 LAB — BASIC METABOLIC PANEL
Anion gap: 10 (ref 5–15)
BUN: 23 mg/dL — AB (ref 6–20)
CHLORIDE: 89 mmol/L — AB (ref 101–111)
CO2: 34 mmol/L — AB (ref 22–32)
CREATININE: 0.62 mg/dL (ref 0.61–1.24)
Calcium: 9.3 mg/dL (ref 8.9–10.3)
GFR calc Af Amer: >60 mL/min (ref >60)
GFR calc non Af Amer: >60 mL/min (ref >60)
GLUCOSE: 100 mg/dL — AB (ref 65–99)
POTASSIUM: 4.5 mmol/L (ref 3.5–5.1)
SODIUM: 133 mmol/L — AB (ref 135–145)

## 2015-06-08 MED ORDER — FILGRASTIM 300 MCG/ML IJ SOLN: 300.0000 ug | Freq: Every day | INTRAMUSCULAR | Status: DC

## 2015-06-08 MED ORDER — PREDNISONE 10 MG (21) PO TBPK: 10.0000 mg | ORAL_TABLET | Freq: Every day | ORAL | Status: AC

## 2015-06-08 MED ORDER — PEGFILGRASTIM INJECTION 6 MG/0.6ML ~~LOC~~
6.0000 mg | PREFILLED_SYRINGE | Freq: Once | SUBCUTANEOUS | Status: AC
  Administered 2015-06-08: 6 mg via SUBCUTANEOUS

## 2015-06-08 MED ORDER — PEGFILGRASTIM INJECTION 6 MG/0.6ML ~~LOC~~
PREFILLED_SYRINGE | SUBCUTANEOUS | Status: AC
  Filled 2015-06-08: qty 0.6

## 2015-06-08 MED ORDER — AMOXICILLIN-POT CLAVULANATE 500-125 MG PO TABS: 1.0000 | ORAL_TABLET | Freq: Three times a day (TID) | ORAL | Status: AC

## 2015-06-08 MED ORDER — MORPHINE SULFATE ER 15 MG PO TBCR: 15.0000 mg | EXTENDED_RELEASE_TABLET | Freq: Two times a day (BID) | ORAL | Status: AC

## 2015-06-08 NOTE — Care Management Note (Signed)
Case Management Note  Patient Details  Name: Brian Nelson MRN: [id number] Date of Birth: 10-Dec-1960  Pt discharging home today with res[facility] of [facility] through [facility]. Brian Nelson, of [facility], made aware of DC and will obtain pt info and deliver more port O2 tanks. Pt was previously on 2lpm N/C but now requiring 8lpm HFNC. O2 assessment has been re-done and home O2 order given for increased O2 flow. Pt will go to [facility] after DC to see Dr. Whitney Nelson and receive injection. Pt will need one injection that will last until his appointment on Monday, no longer needs daily injections. Pt given box of bottles for his pluerex catheter. No further CM needs at this time. Pt aware HH has 48 hours to resume services at DC.   Expected Discharge Date:                  Expected Discharge Plan:  [place]  In-House Referral:  Hospice / Palliative Care  Discharge planning Services  CM Consult  Post Acute Care Choice:  Resumption of Svcs/PTA Provider, Durable Medical Equipment Choice offered to:  Patient  DME Arranged:  Oxygen DME Agency:  [facility]:  RN [facility] Agency:  [facility]  Status of Service:  Completed, signed off  Medicare Important Message Given:    Date Medicare IM Given:    Medicare IM give by:    Date Additional Medicare IM Given:    Additional Medicare Important Message give by:     If discussed at [place] of Stay Meetings, dates discussed:    Additional Comments:  Sherald Barge, RN 06/08/2015, 10:37 AM

## 2015-06-08 NOTE — Progress Notes (Signed)
CRITICAL VALUE ALERT  Critical value received:  WBC 50.9  Date of notification:  06/08/2015  Time of notification:  0739  Critical value read back:Yes.    Nurse who received alert:  Gershon Cull  MD notified (1st page):  Dr. Legrand Rams  Time of first page:  [phone]  MD notified (2nd page):  Time of second page:  Responding MD:  Dr. Legrand Rams  Time MD responded:  [phone]  No new orders at this time.

## 2015-06-08 NOTE — Progress Notes (Signed)
Daily Progress Note   Patient Name: Brian Nelson       Date: 06/08/2015 DOB: 12/18/1960  Age: 54 y.o. MRN#: [id number] Attending Physician: Rosita Fire, MD Primary Care Physician: Rosita Fire, MD Admit Date: 06/01/2015  Reason for Consultation/Follow-up: Non pain symptom management and Psychosocial/spiritual support  Subjective: Brian Nelson is resting quietly in bed today.  He tells me that he had a good night, and that he was able to move his bowels.  He shares that he feels much better, and we talk about decreasing his senna if needed. There is no family/friend at bedside.    Length of Stay: 7 days  Current Medications: Scheduled Meds:  . albuterol  2.5 mg Nebulization Q4H  . antiseptic oral rinse  7 mL Mouth Rinse BID  . enoxaparin (LOVENOX) injection  40 mg Subcutaneous Q24H  . feeding supplement (ENSURE ENLIVE)  237 mL Oral BID BM  . filgrastim  300 mcg Subcutaneous Daily  . fluconazole  100 mg Oral Daily  . methylPREDNISolone (SOLU-MEDROL) injection  40 mg Intravenous Q12H  . mometasone-formoterol  2 puff Inhalation BID  . piperacillin-tazobactam (ZOSYN)  IV  3.375 g Intravenous Q8H  . senna-docusate  2 tablet Oral BID  . vancomycin  1,250 mg Intravenous Q12H    Continuous Infusions:    PRN Meds: albuterol, bisacodyl, mineral oil, morphine injection, oxyCODONE-acetaminophen, prochlorperazine, sodium chloride  Palliative Performance Scale: 50%     Vital Signs: BP 124/70 mmHg  Pulse 101  Temp(Src) 98.4 F (36.9 C) (Oral)  Resp 20  Ht '5\' 8"'$  (6.144 m)  Wt 31.5 kg (122 lb 5.7 oz)  BMI 18.61 kg/m2  SpO2 93% SpO2: SpO2: 93 % O2 Device: O2 Device: High Flow Nasal Cannula O2 Flow Rate: O2 Flow Rate (L/min): 7 L/min  Intake/output summary:  Intake/Output Summary (Last 24 hours) at 06/08/15 1252 Last data filed at 06/08/15 0853  Gross per 24 hour  Intake    720 ml  Output      0 ml  Net    720 ml   LBM:  06/08/15 Baseline Weight: Weight: 55.5 kg (122 lb  5.7 oz) Most recent weight: Weight: 55.5 kg (122 lb 5.7 oz)               Additional Data Reviewed: Recent Labs     06/06/15  0554  06/08/15  0605  WBC  62.6*  50.9*  HGB  8.9*  8.5*  PLT  650*  633*  NA  136  133*  BUN  18  23*  CREATININE  0.71  0.62     Problem List:  Patient Active Problem List   Diagnosis Date Noted  . Metastatic cancer ([place])   . Pleural effusion, left   . Palliative care encounter   . DNR (do not resuscitate) discussion   . COPD (chronic obstructive pulmonary disease) ([place]) 06/02/2015  . HCAP (healthcare-associated pneumonia) 06/02/2015  . Protein-calorie malnutrition, severe 06/02/2015  . Acute respiratory failure with hypoxia ([place]) 06/02/2015  . Dyspnea 06/01/2015  . Anemia 05/19/2015  . 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 Recurrent pleural effusion on right 05/08/2015  . Pleural effusion 05/08/2015  . ETOH abuse   . Drug abuse   . Shortness of breath 05/03/2015  . Cancer ([place])   . Cavitating mass in right upper lung lobe   . Leukocytosis 04/24/2015  . S/P thoracentesis   . Malnutrition of moderate degree 04/23/2015  .  COPD exacerbation ([place]) 04/23/2015  . CAP (community acquired pneumonia) 04/23/2015  . Tobacco use disorder 04/23/2015  . Alcohol dependence ([place]) 04/23/2015  . Pleural effusion on right 04/22/2015     Palliative Care Assessment & Plan    Code Status:  Full code  Goals of Care:  Return to home with [facility]  Goal is to continue chemotherapy as long as possible.   Symptom Management:  Morphine 2 mg IV Q 2 hours PRN.     Palliative Prophylaxis:  Senna S 2 tabs BID  Dulcolax 10 mg PRN.   Enema PRN    Psycho-social/Spiritual:  Desire for further Chaplaincy support:no   Prognosis: Unable to determine, related to response to chemotherapy Discharge Planning: Home with [place] was discussed with nursing  staff, CM, SW and will share with Dr. Legrand Rams on next rounds.   Thank you for allowing the Palliative Medicine Team to assist in the care of this patient.   Time In: 1200 Time Out: 1215 Total Time 15 minutes Prolonged Time Billed  no     Greater than 50%  of this time was spent counseling and coordinating care related to the above assessment and plan.   Brian Novel, NP  06/08/2015, 12:52 PM  Please contact Palliative Medicine Team phone at [phone] for questions and concerns.

## 2015-06-08 NOTE — Progress Notes (Signed)
AVS reviewed with patient and patient's significant other.  Verbalized understanding of discharge instructions, physician follow-up, medications.  Patient's IV removed.  Site WNL.  Patient reports all belongings intact and in possession.  Patient awaiting transport to [facility] for appointment at discharge.  Patient's home O2 delivered and in place. Patient stable at discharge.

## 2015-06-08 NOTE — Progress Notes (Signed)
Brian Nelson presents today for injection per MD orders. Neulasta '6mg'$  administered SQ in left Abdomen. Administration without incident. Patient tolerated well.

## 2015-06-08 NOTE — Patient Instructions (Signed)
[name] at [facility] Discharge Instructions  RECOMMENDATIONS MADE BY THE CONSULTANT AND ANY TEST RESULTS WILL BE SENT TO YOUR REFERRING PHYSICIAN.  Neulasta given per orders. Brian Nelson spoke with patient's friend Morphine prescription and wheelchair prescription given by T. Kefalas PA-C. Neulasta  Appointment for Monday canceled per Dr.Penland Appointment with Gershon Mussel on Wednesday  Thank you for choosing [place] at [facility] to provide your oncology and hematology care.  To afford each patient quality time with our provider, please arrive at least 15 minutes before your scheduled appointment time.    You need to re-schedule your appointment should you arrive 10 or more minutes late.  We strive to give you quality time with our providers, and arriving late affects you and other patients whose appointments are after yours.  Also, if you no show three or more times for appointments you may be dismissed from the clinic at the providers discretion.     Again, thank you for choosing [facility].  Our hope is that these requests will decrease the amount of time that you wait before being seen by our physicians.       _____________________________________________________________  Should you have questions after your visit to [facility], please contact our office at [phone] between the hours of 8:30 a.m. and 4:30 p.m.  Voicemails left after 4:30 p.m. will not be returned until the following business day.  For prescription refill requests, have your pharmacy contact our office.

## 2015-06-08 NOTE — Progress Notes (Signed)
9741 - Patient's O2 sats at rest on room air 80%.  O2 reapplied 7L HFNC sats 94%.

## 2015-06-08 NOTE — Progress Notes (Signed)
ANTIBIOTIC CONSULT NOTE   Pharmacy Consult for Vancomycin & Zosyn Indication: pneumonia  No Known Allergies  Patient Measurements: Height: '5\' 8"'$  (172.7 cm) Weight: 122 lb 5.7 oz (55.5 kg) IBW/kg (Calculated) : 68.4   Vital Signs: Temp: 98.4 F (36.9 C) (11/23 0626) Temp Source: Oral (11/23 0626) BP: 124/70 mmHg (11/23 0626) Pulse Rate: 101 (11/23 0626) Intake/Output from previous day: 11/22 0701 - 11/23 0700 In: 600 [P.O.:600] Out: -  Intake/Output from this shift:    Labs:  Recent Labs  06/05/15 1003 06/06/15 0554 06/08/15 0605  WBC 59.2* 62.6* 50.9*  HGB 9.2* 8.9* 8.5*  PLT 667* 650* 633*  CREATININE  --  0.71 0.62   Estimated Creatinine Clearance: 84.8 mL/min (by C-G formula based on Cr of 0.62).  Recent Labs  06/05/15 1003  [place] 8*     Microbiology: Recent Results (from the past 720 hour(s))  Surgical pcr screen     Status: None   Collection Time: 05/11/15  1:05 AM  Result Value Ref Range Status   MRSA, PCR NEGATIVE NEGATIVE Final   Staphylococcus aureus NEGATIVE NEGATIVE Final    Comment:        The Xpert SA Assay (FDA approved for NASAL specimens in patients over 25 years of age), is one component of a comprehensive surveillance program.  Test performance has been validated by [facility] for patients greater than or equal to 2 year old. It is not intended to diagnose infection nor to guide or monitor treatment.     Medical History: Past Medical History  Diagnosis Date  . Tobacco use disorder 04/23/2015    Quit Fall 2016  . ETOH abuse     Quit Fall 2016  . Illicit drug use     Quit Fall 2016  . On home O2   . 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 Pleural effusion on right     Medications:  Scheduled:  . albuterol  2.5 mg Nebulization Q4H  . antiseptic oral rinse  7 mL Mouth Rinse BID  . enoxaparin (LOVENOX) injection  40 mg Subcutaneous  Q24H  . feeding supplement (ENSURE ENLIVE)  237 mL Oral BID BM  . filgrastim  300 mcg Subcutaneous Daily  . fluconazole  100 mg Oral Daily  . methylPREDNISolone (SOLU-MEDROL) injection  40 mg Intravenous Q12H  . mometasone-formoterol  2 puff Inhalation BID  . piperacillin-tazobactam (ZOSYN)  IV  3.375 g Intravenous Q8H  . senna-docusate  2 tablet Oral BID  . vancomycin  1,250 mg Intravenous Q12H   Assessment:  Day#8 vancomycin and zosyn for HCAP vs metastatic cancer.  He received his first round of chemo 11/18.  No culture data.  Vancomycin dose was adjusted 11/20.  Renal function stable.  Goal of Therapy:  Vancomycin trough level 15-20 mcg/ml  Plan:  Continue Vancomycin '1250mg'$  IV q12h Continue Zosyn 3.375 GM IV every 8 hours, each dose over 4 hours,  Monitor renal function and clinical course  Thanks for allowing pharmacy to be a part of this patient's care.  Excell Seltzer, PharmD Clinical Pharmacist   06/08/2015,8:30 AM

## 2015-06-09 LAB — AFB CULTURE WITH SMEAR (NOT AT ARMC)
Acid Fast Smear: NONE SEEN
Special Requests: NORMAL

## 2015-06-12 NOTE — Discharge Summary (Signed)
Physician Discharge Summary  Patient ID: Brian Nelson MRN: [id number] DOB/AGE: 1961/03/13 54 y.o. 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 (HCC)   Dyspnea   COPD (chronic obstructive pulmonary disease) (HCC)   HCAP (healthcare-associated pneumonia)   Protein-calorie malnutrition, severe   Acute respiratory failure with hypoxia (HCC)   Pleural effusion, left   Palliative care encounter   DNR (do not resuscitate) discussion   Metastatic cancer ([place])     Medication List    STOP taking these medications        NEULASTA DELIVERY KIT [place]     prochlorperazine 10 MG tablet  Commonly known as:  COMPAZINE     TAXOL IV      TAKE these medications        albuterol 108 (90 BASE) MCG/ACT inhaler  Commonly known as:  PROVENTIL HFA;VENTOLIN HFA  Inhale 2 puffs into the lungs every 6 (six) hours as needed for wheezing or shortness of breath.     amoxicillin-clavulanate 500-125 MG tablet  Commonly known as:  AUGMENTIN  Take 1 tablet (500 mg total) by mouth 3 (three) times daily.     CARBOPLATIN IV  Inject into the vein. Every 21 days     dexamethasone 4 MG tablet  Commonly known as:  DECADRON  The day before chemo take 5 tabs in the am and 5 tabs in the pm. The morning of chemo take 5 tabs prior to chemo appt.     fluconazole 100 MG tablet  Commonly known as:  DIFLUCAN  Take 1 tablet (100 mg total) by mouth daily.     Fluticasone-Salmeterol 250-50 MCG/DOSE Aepb  Commonly known as:  ADVAIR DISKUS  Inhale 1 puff into the lungs 2 (two) times daily.     lidocaine-prilocaine cream  Commonly known as:  EMLA  Apply a quarter size amount to port site 1 hour prior to chemo. Do not rub in. Cover with plastic wrap.     ondansetron 8 MG tablet  Commonly  known as:  ZOFRAN  Take 1 tablet (8 mg total) by mouth every 8 (eight) hours as needed for nausea or vomiting.     oxyCODONE-acetaminophen 5-325 MG tablet  Commonly known as:  PERCOCET/ROXICET  Take 1-2 tablets by mouth every 4 (four) hours as needed for moderate pain or severe pain.     predniSONE 10 MG (21) Tbpk tablet  Commonly known as:  STERAPRED UNI-PAK 21 TAB  Take 1 tablet (10 mg total) by mouth daily. 4 tab po daily for b3 days, then 3 tab po daily for 3 days, 2 tab po daily for 3 days and 1 tab po daily for 3 days,        Discharged Condition: home    Consults: pulmonary & oncology  Significant Diagnostic Studies: Dg Chest 2 View  06/01/2015  CLINICAL DATA:  Stage IV adenocarcinoma of unknown primary, pleural effusion EXAM: CHEST  2 VIEW COMPARISON:  05/11/2015, CT chest 05/08/2015 FINDINGS: RIGHT subclavian Port-A-Cath with tip projecting over SVC. RIGHT basilar thoracostomy tube/PleurX catheter. Stable heart size and mediastinal contours. Persistent enlargement of LEFT hilum secondary to adenopathy. Persistent partially loculated RIGHT pleural effusion with atelectasis and consolidation in RIGHT lung. Increased LEFT lung infiltrate involving upper and lower lobe. Underlying emphysematous changes with large bulla at LEFT upper lobe again identified. No  definite pneumothorax. IMPRESSION: COPD changes with increased infiltrate in LEFT lung since previous exam. This could represent infection though cannot exclude lymphangitic tumor spread. New RIGHT basilar thoracostomy tube/PleurX catheter. Persistent partially loculated RIGHT pleural effusion with atelectasis and infiltrate in RIGHT lung, probably not significantly changed when accounting for differences in technique prior exam. Findings called to Robynn Pane PA on 06/01/2015 at 1012 hours. Electronically Signed   By: Lavonia Dana M.D.   On: 06/01/2015 10:14   Ct Angio Chest Pe W/cm &/or Wo Cm  06/01/2015  CLINICAL DATA:  Short  of breath EXAM: CT ANGIOGRAPHY CHEST WITH CONTRAST TECHNIQUE: Multidetector CT imaging of the chest was performed using the standard protocol during bolus administration of intravenous contrast. Multiplanar CT image reconstructions and MIPs were obtained to evaluate the vascular anatomy. CONTRAST:  193mL OMNIPAQUE IOHEXOL 350 MG/ML SOLN COMPARISON:  05/08/2015 FINDINGS: There are no filling defects in the pulmonary arterial tree to suggest acute pulmonary thromboembolism. Loculated right pleural effusion has improved after PleurX drain placement. Extensive consolidation likely due to diffuse infiltration by tumor is not significantly changed throughout the right lung. Aeration in the right lower lobe has improved. Patchy heterogeneous opacities throughout the left lung have increased. A very small left pleural effusion has developed. Mediastinal and hilar adenopathy has subjectively increased. Aorta and great vessels are patent within the confines of the examination. Left innominate vein and SVC remain patent. Airway is patent. There is some compression of the bronchus intermedius. There are some lytic lesions within the bones. For example see image 40 of series 5 within the right side of the sixth thoracic vertebral body. There is no vertebral compression deformity. Review of the MIP images confirms the above findings. IMPRESSION: There is no evidence of pulmonary thromboembolism. Patchy consolidation versus tumor infiltration throughout the right lung is not significantly changed. Loculated right pleural effusion has improved after PleurX drain placement. Aeration of the right lower lobe has also improved. Very small left pleural effusion has developed. Heterogeneous opacities have increased within the left lung either due to an inflammatory process or endobronchial spread of tumor. New lytic bone lesions worrisome for metastatic disease is suspected. Electronically Signed   By: Marybelle Killings M.D.   On: 06/01/2015  13:52   Dg Chest Port 1 View  06/06/2015  CLINICAL DATA:  Shortness of breath. EXAM: PORTABLE CHEST 1 VIEW COMPARISON:  06/01/2015. FINDINGS: There is a right chest wall port a catheter with tip in the SVC. There is progressive worsening aeration to the right lung likely reflecting increase and loculated right effusion. Progressive airspace opacification within the left midlung and left lower lobe. Large emphysematous bulla is again noted within the left apex. IMPRESSION: 1. Suspect increase in right pleural effusion 2. Increase in airspace opacification within the left midlung and left base. Electronically Signed   By: Kerby Moors M.D.   On: 06/06/2015 09:23    Lab Results: Basic Metabolic Panel: No results for input(s): NA, K, CL, CO2, GLUCOSE, BUN, CREATININE, CALCIUM, MG, PHOS in the last 72 hours. Liver Function Tests: No results for input(s): AST, ALT, ALKPHOS, BILITOT, PROT, ALBUMIN in the last 72 hours.   CBC: No results for input(s): WBC, NEUTROABS, HGB, HCT, MCV, PLT in the last 72 hours.  No results found for this or any previous visit (from the past 240 hour(s)).   Hospital Course:   This is a 54 years old male patient with complicated medical illnesses was admitted due to shortness of breath. Patient  was in and out hospital due to similar problem. 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 The primary source of his adenocarcinoma  is not well established. He was treated with combinations of IV antibiotics for pneumonia. He was seen also by oncology and was started on chemotherapy. Patient felt better and was treated with oral antibiotics to be followed in oncology clinic.   Discharge Exam: Blood pressure 127/73, pulse 95, temperature 98.6 F (37 C), temperature source Oral, resp. rate 20, height _0  (1.727 m), weight 55.5 kg (122 lb 5.7 oz), SpO2 97 %.    Disposition:  home        Follow-up Information    Follow up with [facility].   Contact information:   [address] [place] [address] [phone]       Follow up with [facility], MD On 06/15/2015.   Specialty:  Internal Medicine   Why:  at 10:20 am   Contact information:   [place] [address] [phone]       Follow up with Molli Hazard, MD On 06/13/2015.   Specialties:  Hematology and Oncology, Oncology   Why:  @ 2pm   Contact information:   [place] [address] [phone]       Signed: Rosita Fire   06/12/2015, 9:26 AM

## 2015-06-13 ENCOUNTER — Ambulatory Visit (HOSPITAL_COMMUNITY): Payer: Medicaid Other

## 2015-06-15 ENCOUNTER — Encounter (HOSPITAL_BASED_OUTPATIENT_CLINIC_OR_DEPARTMENT_OTHER): Payer: Medicaid Other | Admitting: Oncology

## 2015-06-15 ENCOUNTER — Encounter (HOSPITAL_COMMUNITY): Payer: Self-pay | Admitting: Oncology

## 2015-06-15 VITALS — BP 122/66 | HR 96 | Temp 98.3°F | Resp 22 | Wt 116.7 lb

## 2015-06-15 DIAGNOSIS — G4700 Insomnia, unspecified: Secondary | ICD-10-CM

## 2015-06-15 DIAGNOSIS — R630 Anorexia: Secondary | ICD-10-CM | POA: Diagnosis not present

## 2015-06-15 DIAGNOSIS — C801 Malignant (primary) neoplasm, unspecified: Secondary | ICD-10-CM | POA: Diagnosis present

## 2015-06-15 DIAGNOSIS — D630 Anemia in neoplastic disease: Secondary | ICD-10-CM | POA: Diagnosis not present

## 2015-06-15 DIAGNOSIS — C3491 Malignant neoplasm of unspecified part of right bronchus or lung: Secondary | ICD-10-CM | POA: Diagnosis present

## 2015-06-15 LAB — COMPREHENSIVE METABOLIC PANEL
ALK PHOS: 221 U/L — AB (ref 38–126)
ALT: 16 U/L — AB (ref 17–63)
AST: 19 U/L (ref 15–41)
Albumin: 2.5 g/dL — ABNORMAL LOW (ref 3.5–5.0)
Anion gap: 9 (ref 5–15)
BUN: 19 mg/dL (ref 6–20)
CALCIUM: 9.3 mg/dL (ref 8.9–10.3)
CHLORIDE: 91 mmol/L — AB (ref 101–111)
CO2: 34 mmol/L — AB (ref 22–32)
CREATININE: 0.51 mg/dL — AB (ref 0.61–1.24)
GFR calc non Af Amer: >60 mL/min (ref >60)
GLUCOSE: 93 mg/dL (ref 65–99)
Potassium: 4.5 mmol/L (ref 3.5–5.1)
SODIUM: 134 mmol/L — AB (ref 135–145)
Total Bilirubin: 0.5 mg/dL (ref 0.3–1.2)
Total Protein: 6.3 g/dL — ABNORMAL LOW (ref 6.5–8.1)

## 2015-06-15 LAB — CBC WITH DIFFERENTIAL/PLATELET
Basophils Absolute: 0.1 10*3/uL (ref 0.0–0.1)
Basophils Relative: 0 %
EOS PCT: 0 %
Eosinophils Absolute: 0 10*3/uL (ref 0.0–0.7)
HCT: 28 % — ABNORMAL LOW (ref 39.0–52.0)
HEMOGLOBIN: 8.6 g/dL — AB (ref 13.0–17.0)
LYMPHS ABS: 1.1 10*3/uL (ref 0.7–4.0)
LYMPHS PCT: 2 %
MCH: 26.2 pg (ref 26.0–34.0)
MCHC: 30.7 g/dL (ref 30.0–36.0)
MCV: 85.4 fL (ref 78.0–100.0)
MONO ABS: 1.9 10*3/uL — AB (ref 0.1–1.0)
Monocytes Relative: 3 %
NEUTROS ABS: 61.7 10*3/uL — AB (ref 1.7–7.7)
Neutrophils Relative %: 95 %
Platelets: 545 10*3/uL — ABNORMAL HIGH (ref 150–400)
RBC: 3.28 MIL/uL — AB (ref 4.22–5.81)
RDW: 17.3 % — ABNORMAL HIGH (ref 11.5–15.5)
WBC Morphology: INCREASED
WBC: 64.7 10*3/uL — AB (ref 4.0–10.5)

## 2015-06-15 LAB — SAMPLE TO BLOOD BANK

## 2015-06-15 MED ORDER — MEGESTROL ACETATE 400 MG/10ML PO SUSP: 400.0000 mg | Freq: Two times a day (BID) | ORAL | Status: AC

## 2015-06-15 MED ORDER — GUAIFENESIN ER 600 MG PO TB12: 600.0000 mg | ORAL_TABLET | Freq: Two times a day (BID) | ORAL | Status: AC | PRN

## 2015-06-15 MED ORDER — TEMAZEPAM 15 MG PO CAPS: 15.0000 mg | ORAL_CAPSULE | Freq: Every evening | ORAL | Status: AC | PRN

## 2015-06-15 MED ORDER — OXYCODONE-ACETAMINOPHEN 5-325 MG PO TABS: 1.0000 | ORAL_TABLET | ORAL | Status: AC | PRN

## 2015-06-15 NOTE — Patient Instructions (Signed)
..  [place] at [facility] Discharge Instructions  RECOMMENDATIONS MADE BY THE CONSULTANT AND ANY TEST RESULTS WILL BE SENT TO YOUR REFERRING PHYSICIAN.  Labs today Decrease oxygen to 6l/min  Return as scheduled for f/u Add avastin next cycle  Thank you for choosing [place] at [facility] to provide your oncology and hematology care.  To afford each patient quality time with our provider, please arrive at least 15 minutes before your scheduled appointment time.    You need to re-schedule your appointment should you arrive 10 or more minutes late.  We strive to give you quality time with our providers, and arriving late affects you and other patients whose appointments are after yours.  Also, if you no show three or more times for appointments you may be dismissed from the clinic at the providers discretion.     Again, thank you for choosing [facility].  Our hope is that these requests will decrease the amount of time that you wait before being seen by our physicians.       _____________________________________________________________  Should you have questions after your visit to [facility], please contact our office at [phone] between the hours of 8:30 a.m. and 4:30 p.m.  Voicemails left after 4:30 p.m. will not be returned until the following business day.  For prescription refill requests, have your pharmacy contact our office.

## 2015-06-15 NOTE — Progress Notes (Signed)
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 male returns for followup of unknown primary, Stage IV, with cancerType ID providing a 89% probability of gastroesophageal adenocarcinoma.    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 Extensive empyema at the right lung base. Compressive atelectasis of the right lung. Background pattern of centrilobular emphysema with a 12 cm bulla at the left lung apex. Areas of abnormal density in    04/23/2015 Procedure R thoracentesis   04/23/2015 Pathology Results MALIGNANT CELLS PRESENT, CONSISTENT WITH NON SMALL CELL CARCINOMA.  THE POSITIVE STAINING WITH CDX-2 RAISES THE POSSIBILITY OF A GASTROINTESTINAL PRIMARY   04/27/2015 Imaging CT abd/pelvis- Moderate right pleural effusion with loculation and pleural nodularity suggesting malignant effusion. Atelectasis in the right lung base. Ground-glass nodules in the left lung base, largest 1.1 cm. This is likely metastatic although infl   04/27/2015 Procedure Bronchoscopy by Dr. Lake Bells (Pulmonology)   04/27/2015 Pathology Results BRONCHIAL BRUSHING, RIGHT UPPER LOBE (SPECIMEN 2 OF 2 COLLECTED 04-27-2015) SUSPICIOUS FOR MALIGNANCY.   04/27/2015 Pathology Results BRONCHIAL LAVAGE RIGHT UPPER LOBE (SPECIMEN 1 OF 2 COLLECTED  04/27/2015) MALIGNANT CELLS PRESENT,   04/28/2015 Imaging MRI brain- Normal MRI appearance of the brain. No evidence for metastatic disease to the brain or meninges. Degenerative changes in the upper cervical spine without definite osseous metastases.   05/03/2015 Imaging CT chest- Persistent large at least partially loculated RIGHT pleural effusion with scattered areas of pleural thickening and enhancement consistent with malignant pleural effusion as identified by prior thoracentesis and cytology. Complete atelect   05/04/2015 Procedure R thoracentesis   05/04/2015 Pathology Results PLEURAL FLUID, RIGHT (SPECIMEN 1 OF 1 COLLECTED 05/03/18): MALIGNANT CELLS PRESENT   05/05/2015 Imaging Bone scan- No evidence of osseous metastatic disease.   05/06/2015 Procedure EGD/Colonoscopy- Negative for findings suggestive of malignancy.  Performed by Dr. Oneida Alar.   05/08/2015 Imaging CT angio chest- No pulmonary embolus. 2. Large partially loculated right pleural effusion with near complete atelectasis and consolidation of the right lung. The pleural effusion has increased in size compared to exam 5 days prior. 3. Advanced emphys   05/11/2015 Procedure Port-A-Cath insertion, by Dr. Arnoldo Morale   05/12/2015 Procedure Right PleurX placed by IR   05/13/2015 Pathology Results CANCERTYPE ID- 22% probability of gastroesophageal adenocarcinoma, cannot exclude pancreaticobiliary (7%) and intestine.     05/13/2015 Pathology Results Cancer types rule out w 95% confidence: Adrenal, brain, GIST, Germ cell, H&N, kidney, HCC, Lung, lymphoma, melanoma, meningioma, mesothelioma, neuroendocrine, prostate, sarcoma, sex cord stromal,squamous cell, basal cell, thymus, thyroid, urinary bladder   05/13/2015 Pathology Results HER2 negative by FISH, equivocal by [facility]   05/17/2015 Pathology Results [facility]- not enough tissue for testing.   06/01/2015 Imaging CT angio chest- Heterogeneous opacities have increased within the left lung  either due to an inflammatory process or endobronchial spread of tumor.  New lytic bone lesions worrisome for metastatic disease is suspected.   06/01/2015 - 06/08/2015 Hospital Admission COPD, HCAP  06/03/2015 -  Chemotherapy Carboplatin/Paclitaxel    I personally reviewed and went over laboratory results with the patient.  The results are noted within this dictation. Labs will be updated today.  He reports he tolerated treatment well. He denies any nausea or vomiting.  He complains of the following: 1. Continued SOB- slightly improved.  On O2 8 LPM at home. 2. Phlegm- currently on antibiotics.  Will add Mucinex.  Rx printed. 3. Requests a refill on pain medication- Oxycodone is printed 4. Decreased appetite- will try Megace.  Patient educated on Megace. 5. Insomnia- will try low-dose temazepam.    Past Medical History  Diagnosis Date  . Tobacco use disorder 04/23/2015    Quit Fall 2016  . ETOH abuse     Quit Fall 2016  . Illicit drug use     Quit Fall 2016  . On home O2   . 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     has No Known Allergies.  Current Outpatient Prescriptions on File Prior to Visit  Medication Sig Dispense Refill  . albuterol (PROVENTIL HFA;VENTOLIN HFA) 108 (90 BASE) MCG/ACT inhaler Inhale 2 puffs into the lungs every 6 (six) hours as needed for wheezing or shortness of breath. 1 Inhaler 2  . amoxicillin-clavulanate (AUGMENTIN) 500-125 MG tablet Take 1 tablet (500 mg total) by mouth 3 (three) times daily. 30 tablet 0  . CARBOPLATIN IV Inject into the vein. Every 21 days    . Fluticasone-Salmeterol (ADVAIR DISKUS) 250-50 MCG/DOSE AEPB Inhale 1 puff into the lungs 2 (two) times daily. 60 each 12  . lidocaine-prilocaine (EMLA) cream Apply a quarter size amount to port site 1 hour prior to chemo. Do not rub in. Cover with plastic wrap. 30 g 2  . morphine (MS CONTIN) 15 MG 12 hr tablet Take 1 tablet (15 mg total) by mouth every 12 (twelve) hours. 60 tablet 0  . predniSONE (STERAPRED UNI-PAK 21 TAB) 10 MG (21) TBPK tablet Take 1 tablet (10 mg total) by mouth daily. 4 tab po daily for b3 days, then 3 tab po daily for 3 days, 2 tab po daily for 3 days and 1 tab po daily for 3 days, 30 tablet 0  . dexamethasone (DECADRON) 4 MG tablet The day before chemo take 5 tabs in the am and 5 tabs in the pm. The morning of chemo take 5 tabs prior to chemo appt. (Patient not taking: Reported on 06/15/2015) 45 tablet 1  . fluconazole (DIFLUCAN) 100 MG tablet Take 1 tablet (100 mg total) by mouth daily. (Patient not taking: Reported on 06/15/2015) 14 tablet 0  . ondansetron (ZOFRAN) 8 MG tablet Take 1 tablet (8 mg total) by mouth every 8 (eight) hours as needed for nausea or vomiting. (Patient not taking: Reported on 06/15/2015) 30 tablet 2  No current facility-administered medications on file prior to visit.    Past Surgical History  Procedure Laterality Date  . Video bronchoscopy Bilateral 04/27/2015    Procedure: VIDEO BRONCHOSCOPY WITH FLUORO;  Surgeon: Juanito Doom, MD;  Location: [facility];  Service: Cardiopulmonary;  Laterality: Bilateral;  . Hernia repair Right 2009    hernia [facility] [place]  [facility]  . Colonoscopy with propofol N/A 05/06/2015    Procedure: COLONOSCOPY WITH PROPOFOL; IN CECUM AT 1159; WITHDRAWAL TIME 23 MINUTES;  Surgeon: Danie Binder, MD;  Location: [facility];  Service: Endoscopy;  Laterality: N/A;  . Esophagogastroduodenoscopy (egd) with propofol N/A 05/06/2015    Procedure: ESOPHAGOGASTRODUODENOSCOPY (EGD) WITH PROPOFOL;  Surgeon: Danie Binder, MD;  Location: [facility];  Service: Endoscopy;  Laterality: N/A;  . Portacath placement Right 05/11/2015    Procedure: INSERTION PORT-A-CATH;  Surgeon: Aviva Signs, MD;  Location: [facility];  Service: General;  Laterality: Right;  . Plurex catheter Right     Denies any headaches, dizziness, double vision, fevers, chills, night sweats, nausea, vomiting, diarrhea, constipation, chest pain, heart palpitations, blood in stool, black tarry stool, urinary pain, urinary burning, urinary frequency, hematuria.   PHYSICAL EXAMINATION  ECOG PERFORMANCE STATUS: 2 - Symptomatic, <50% confined to bed  Filed Vitals:   06/15/15 1446  BP: 122/66  Pulse: 96  Temp: 98.3 F (36.8 C)  Resp: 22    GENERAL:alert, anxious, cachectic, cooperative and chronically ill appearing with O2 being delivered by [place], accompanied by cousin and mother. SKIN: skin color, texture, turgor are normal HEAD: Normocephalic, No masses, lesions, tenderness or abnormalities EYES: normal, PERRLA, EOMI, Conjunctiva are pink and non-injected EARS: External ears normal OROPHARYNX:mucous membranes are moist  NECK: trachea midline LYMPH:  not examined BREAST:not examined LUNGS: clear to auscultation , decreased breath sounds HEART: regular rate & rhythm ABDOMEN:abdomen soft, non-tender and normal bowel sounds BACK: Back symmetric, no curvature. EXTREMITIES:less then 2 second capillary refill, no joint deformities,  effusion, or inflammation, no skin discoloration, no cyanosis, positive findings:  Clubbing of fingernails  NEURO: alert & oriented x 3 with fluent speech, no focal motor/sensory deficits, gait normal    LABORATORY DATA: CBC    Component Value Date/Time   WBC 64.7* 06/15/2015 1550   RBC 3.28* 06/15/2015 1550   RBC 3.07* 05/10/2015 1119   HGB 8.6* 06/15/2015 1550   HCT 28.0* 06/15/2015 1550   PLT 545* 06/15/2015 1550   MCV 85.4 06/15/2015 1550   MCH 26.2 06/15/2015 1550   MCHC 30.7 06/15/2015 1550   RDW 17.3* 06/15/2015 1550   LYMPHSABS 1.1 06/15/2015 1550   MONOABS 1.9* 06/15/2015 1550   EOSABS 0.0 06/15/2015 1550   BASOSABS 0.1 06/15/2015 1550      Chemistry      Component Value Date/Time   NA 134* 06/15/2015 1550   K 4.5 06/15/2015 1550   CL 91* 06/15/2015 1550   CO2 34* 06/15/2015 1550   BUN 19 06/15/2015 1550   CREATININE 0.51* 06/15/2015 1550      Component Value Date/Time   CALCIUM 9.3 06/15/2015 1550   ALKPHOS 221* 06/15/2015 1550   AST 19 06/15/2015 1550   ALT 16* 06/15/2015 1550   BILITOT 0.5 06/15/2015 1550        PENDING LABS:   RADIOGRAPHIC STUDIES:  Dg Chest 2 View  06/01/2015  CLINICAL DATA:  Stage IV adenocarcinoma of unknown primary, pleural effusion EXAM: CHEST  2 VIEW COMPARISON:  05/11/2015, CT chest 05/08/2015 FINDINGS: RIGHT subclavian Port-A-Cath with  tip projecting over SVC. RIGHT basilar thoracostomy tube/PleurX catheter. Stable heart size and mediastinal contours. Persistent enlargement of LEFT hilum secondary to adenopathy. Persistent partially loculated RIGHT pleural effusion with atelectasis and consolidation in RIGHT lung. Increased LEFT lung infiltrate involving upper and lower lobe. Underlying emphysematous changes with large bulla at LEFT upper lobe again identified. No definite pneumothorax. IMPRESSION: COPD changes with increased infiltrate in LEFT lung since previous exam. This could represent infection though cannot exclude  lymphangitic tumor spread. New RIGHT basilar thoracostomy tube/PleurX catheter. Persistent partially loculated RIGHT pleural effusion with atelectasis and infiltrate in RIGHT lung, probably not significantly changed when accounting for differences in technique prior exam. Findings called to Robynn Pane PA on 06/01/2015 at 1012 hours. Electronically Signed   By: Lavonia Dana M.D.   On: 06/01/2015 10:14   Ct Angio Chest Pe W/cm &/or Wo Cm  06/01/2015  CLINICAL DATA:  Short of breath EXAM: CT ANGIOGRAPHY CHEST WITH CONTRAST TECHNIQUE: Multidetector CT imaging of the chest was performed using the standard protocol during bolus administration of intravenous contrast. Multiplanar CT image reconstructions and MIPs were obtained to evaluate the vascular anatomy. CONTRAST:  127mL OMNIPAQUE IOHEXOL 350 MG/ML SOLN COMPARISON:  05/08/2015 FINDINGS: There are no filling defects in the pulmonary arterial tree to suggest acute pulmonary thromboembolism. Loculated right pleural effusion has improved after PleurX drain placement. Extensive consolidation likely due to diffuse infiltration by tumor is not significantly changed throughout the right lung. Aeration in the right lower lobe has improved. Patchy heterogeneous opacities throughout the left lung have increased. A very small left pleural effusion has developed. Mediastinal and hilar adenopathy has subjectively increased. Aorta and great vessels are patent within the confines of the examination. Left innominate vein and SVC remain patent. Airway is patent. There is some compression of the bronchus intermedius. There are some lytic lesions within the bones. For example see image 40 of series 5 within the right side of the sixth thoracic vertebral body. There is no vertebral compression deformity. Review of the MIP images confirms the above findings. IMPRESSION: There is no evidence of pulmonary thromboembolism. Patchy consolidation versus tumor infiltration throughout the  right lung is not significantly changed. Loculated right pleural effusion has improved after PleurX drain placement. Aeration of the right lower lobe has also improved. Very small left pleural effusion has developed. Heterogeneous opacities have increased within the left lung either due to an inflammatory process or endobronchial spread of tumor. New lytic bone lesions worrisome for metastatic disease is suspected. Electronically Signed   By: Marybelle Killings M.D.   On: 06/01/2015 13:52   Dg Chest Port 1 View  06/06/2015  CLINICAL DATA:  Shortness of breath. EXAM: PORTABLE CHEST 1 VIEW COMPARISON:  06/01/2015. FINDINGS: There is a right chest wall port a catheter with tip in the SVC. There is progressive worsening aeration to the right lung likely reflecting increase and loculated right effusion. Progressive airspace opacification within the left midlung and left lower lobe. Large emphysematous bulla is again noted within the left apex. IMPRESSION: 1. Suspect increase in right pleural effusion 2. Increase in airspace opacification within the left midlung and left base. Electronically Signed   By: Kerby Moors M.D.   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  Oncology history updated.   Labs today for nadir check: CBC diff, CMET  Following Rx's printed: 1. Oxycodone 2. Mucinex 3. Temazepam 4. Megace (I will drop these Rx off at [facility] for the patient).  I have recommended decreasing O2 to 6 LPM at home.  I broached the subject of palliative intervention.  I have also asked him to consider his goals of care if  treatment fails.  His mother reports drainage of about 25-50 cc of pleural fluid daily.  Return in 2 weeks for follow-up and treatment as planned.  I have added Avastin to his treatment plan.      THERAPY PLAN:  Continue with treatment as planned in a palliative fashion.    All questions were answered. The patient knows to call the clinic with any problems, questions or concerns. We can certainly see the patient much sooner if necessary.  Patient and plan discussed with Dr. Ancil Linsey and she is in agreement with the aforementioned.   This note is electronically signed by: Robynn Pane, PA-C 06/15/2015 7:00 PM

## 2015-06-15 NOTE — Progress Notes (Signed)
CRITICAL VALUE ALERT Critical value received:  WBC 64.7 Date of notification:  06/15/2015 Time of notification: 2712 Critical value read back:  Yes.   Nurse who received alert:  Shellia Carwin RN MD notified (1st page):  Dr Whitney Muse

## 2015-06-15 NOTE — Progress Notes (Signed)
..  Brian Nelson's reason for visit today is for labs as scheduled per MD orders.  Venipuncture performed with a 23 gauge butterfly needle to R Antecubital.  Brian Nelson tolerated procedure well and without incident; questions were answered and patient was discharged.

## 2015-06-15 NOTE — Assessment & Plan Note (Addendum)
Stage IV adenocarcinoma of unknown primary, CancerType ID on 05/13/2015 demonstrates an 89% probability of Gastroesophageal adenocarcinoma, but pancreaticobiliary (7% probability) and intestine primary (<5% probability) cannot be ruled out.  Oncology history updated.   Labs today for nadir check: CBC diff, CMET  Following Rx's printed: 1. Oxycodone 2. Mucinex 3. Temazepam 4. Megace (I will drop these Rx off at [facility] for the patient).  I have recommended decreasing O2 to 6 LPM at home.  I broached the subject of palliative intervention.  I have also asked him to consider his goals of care if treatment fails.  His mother reports drainage of about 25-50 cc of pleural fluid daily.  Return in 2 weeks for follow-up and treatment as planned.  I have added Avastin to his treatment plan.

## 2015-06-18 ENCOUNTER — Other Ambulatory Visit: Payer: Self-pay

## 2015-06-18 ENCOUNTER — Inpatient Hospital Stay (HOSPITAL_COMMUNITY)
Admission: EM | Admit: 2015-06-18 | Discharge: 2015-07-17 | DRG: 189 | Disposition: E | Payer: Medicaid Other | Attending: Internal Medicine | Admitting: Internal Medicine

## 2015-06-18 ENCOUNTER — Encounter (HOSPITAL_COMMUNITY): Payer: Self-pay | Admitting: Emergency Medicine

## 2015-06-18 ENCOUNTER — Emergency Department (HOSPITAL_COMMUNITY): Payer: Medicaid Other

## 2015-06-18 DIAGNOSIS — Z7951 Long term (current) use of inhaled steroids: Secondary | ICD-10-CM

## 2015-06-18 DIAGNOSIS — J9600 Acute respiratory failure, unspecified whether with hypoxia or hypercapnia: Secondary | ICD-10-CM | POA: Insufficient documentation

## 2015-06-18 DIAGNOSIS — Z8701 Personal history of pneumonia (recurrent): Secondary | ICD-10-CM | POA: Diagnosis not present

## 2015-06-18 DIAGNOSIS — Z66 Do not resuscitate: Secondary | ICD-10-CM | POA: Diagnosis present

## 2015-06-18 DIAGNOSIS — Y95 Nosocomial condition: Secondary | ICD-10-CM | POA: Diagnosis present

## 2015-06-18 DIAGNOSIS — L89152 Pressure ulcer of sacral region, stage 2: Secondary | ICD-10-CM | POA: Diagnosis present

## 2015-06-18 DIAGNOSIS — Z87891 Personal history of nicotine dependence: Secondary | ICD-10-CM

## 2015-06-18 DIAGNOSIS — L8990 Pressure ulcer of unspecified site, unspecified stage: Secondary | ICD-10-CM | POA: Insufficient documentation

## 2015-06-18 DIAGNOSIS — E43 Unspecified severe protein-calorie malnutrition: Secondary | ICD-10-CM | POA: Diagnosis not present

## 2015-06-18 DIAGNOSIS — C801 Malignant (primary) neoplasm, unspecified: Secondary | ICD-10-CM | POA: Diagnosis not present

## 2015-06-18 DIAGNOSIS — Z681 Body mass index (BMI) 19 or less, adult: Secondary | ICD-10-CM | POA: Diagnosis not present

## 2015-06-18 DIAGNOSIS — J910 Malignant pleural effusion: Secondary | ICD-10-CM | POA: Diagnosis present

## 2015-06-18 DIAGNOSIS — Z833 Family history of diabetes mellitus: Secondary | ICD-10-CM | POA: Diagnosis not present

## 2015-06-18 DIAGNOSIS — J9621 Acute and chronic respiratory failure with hypoxia: Secondary | ICD-10-CM | POA: Diagnosis present

## 2015-06-18 DIAGNOSIS — J441 Chronic obstructive pulmonary disease with (acute) exacerbation: Secondary | ICD-10-CM | POA: Diagnosis present

## 2015-06-18 DIAGNOSIS — R0602 Shortness of breath: Secondary | ICD-10-CM | POA: Diagnosis present

## 2015-06-18 DIAGNOSIS — J948 Other specified pleural conditions: Secondary | ICD-10-CM | POA: Diagnosis not present

## 2015-06-18 DIAGNOSIS — C7800 Secondary malignant neoplasm of unspecified lung: Secondary | ICD-10-CM | POA: Diagnosis present

## 2015-06-18 DIAGNOSIS — J9690 Respiratory failure, unspecified, unspecified whether with hypoxia or hypercapnia: Secondary | ICD-10-CM

## 2015-06-18 DIAGNOSIS — C799 Secondary malignant neoplasm of unspecified site: Secondary | ICD-10-CM | POA: Diagnosis present

## 2015-06-18 DIAGNOSIS — J440 Chronic obstructive pulmonary disease with acute lower respiratory infection: Secondary | ICD-10-CM | POA: Diagnosis present

## 2015-06-18 DIAGNOSIS — Z9981 Dependence on supplemental oxygen: Secondary | ICD-10-CM | POA: Diagnosis not present

## 2015-06-18 DIAGNOSIS — Z79891 Long term (current) use of opiate analgesic: Secondary | ICD-10-CM

## 2015-06-18 DIAGNOSIS — E872 Acidosis: Secondary | ICD-10-CM | POA: Diagnosis present

## 2015-06-18 DIAGNOSIS — J90 Pleural effusion, not elsewhere classified: Secondary | ICD-10-CM | POA: Diagnosis present

## 2015-06-18 DIAGNOSIS — J9601 Acute respiratory failure with hypoxia: Secondary | ICD-10-CM | POA: Diagnosis present

## 2015-06-18 DIAGNOSIS — J9602 Acute respiratory failure with hypercapnia: Secondary | ICD-10-CM

## 2015-06-18 DIAGNOSIS — Z9221 Personal history of antineoplastic chemotherapy: Secondary | ICD-10-CM | POA: Diagnosis not present

## 2015-06-18 DIAGNOSIS — J189 Pneumonia, unspecified organism: Secondary | ICD-10-CM | POA: Diagnosis present

## 2015-06-18 DIAGNOSIS — J9622 Acute and chronic respiratory failure with hypercapnia: Principal | ICD-10-CM | POA: Diagnosis present

## 2015-06-18 LAB — BLOOD GAS, ARTERIAL
ACID-BASE EXCESS: 9.2 mmol/L — AB (ref 0.0–2.0)
Acid-Base Excess: 10.8 mmol/L — ABNORMAL HIGH (ref 0.0–2.0)
BICARBONATE: 31.6 meq/L — AB (ref 20.0–24.0)
Bicarbonate: 33.5 mEq/L — ABNORMAL HIGH (ref 20.0–24.0)
DELIVERY SYSTEMS: POSITIVE
DRAWN BY: 382351
DRAWN BY: 105551
Expiratory PAP: 6
FIO2: 1
FIO2: 0.5
Inspiratory PAP: 12
O2 SAT: 95.6 %
O2 Saturation: 95 %
PATIENT TEMPERATURE: 37
PCO2 ART: 70.8 mmHg — AB (ref 35.0–45.0)
PO2 ART: 84.1 mmHg (ref 80.0–100.0)
RATE: 10 resp/min
pCO2 arterial: 80.8 mmHg (ref 35.0–45.0)
pH, Arterial: 7.272 — ABNORMAL LOW (ref 7.350–7.450)
pH, Arterial: 7.337 — ABNORMAL LOW (ref 7.350–7.450)
pO2, Arterial: 94.5 mmHg (ref 80.0–100.0)

## 2015-06-18 LAB — CBC
HEMATOCRIT: 28.3 % — AB (ref 39.0–52.0)
Hemoglobin: 8.9 g/dL — ABNORMAL LOW (ref 13.0–17.0)
MCH: 27.1 pg (ref 26.0–34.0)
MCHC: 31.4 g/dL (ref 30.0–36.0)
MCV: 86.3 fL (ref 78.0–100.0)
PLATELETS: 545 10*3/uL — AB (ref 150–400)
RBC: 3.28 MIL/uL — AB (ref 4.22–5.81)
RDW: 17.8 % — ABNORMAL HIGH (ref 11.5–15.5)
WBC: 47.8 10*3/uL — ABNORMAL HIGH (ref 4.0–10.5)

## 2015-06-18 LAB — I-STAT CG4 LACTIC ACID, ED: LACTIC ACID, VENOUS: 1.71 mmol/L (ref 0.5–2.0)

## 2015-06-18 LAB — BASIC METABOLIC PANEL
Anion gap: 11 (ref 5–15)
BUN: 15 mg/dL (ref 6–20)
CHLORIDE: 87 mmol/L — AB (ref 101–111)
CO2: 34 mmol/L — AB (ref 22–32)
CREATININE: 0.58 mg/dL — AB (ref 0.61–1.24)
Calcium: 9.4 mg/dL (ref 8.9–10.3)
GFR calc non Af Amer: >60 mL/min (ref >60)
Glucose, Bld: 114 mg/dL — ABNORMAL HIGH (ref 65–99)
POTASSIUM: 4.3 mmol/L (ref 3.5–5.1)
Sodium: 132 mmol/L — ABNORMAL LOW (ref 135–145)

## 2015-06-18 LAB — TROPONIN I: Troponin I: <0.03 ng/mL (ref <0.031)

## 2015-06-18 LAB — PROTIME-INR
INR: 1.22 (ref 0.00–1.49)
Prothrombin Time: 15.5 seconds — ABNORMAL HIGH (ref 11.6–15.2)

## 2015-06-18 LAB — BRAIN NATRIURETIC PEPTIDE: B Natriuretic Peptide: 310 pg/mL — ABNORMAL HIGH (ref 0.0–100.0)

## 2015-06-18 LAB — MRSA PCR SCREENING: MRSA BY PCR: NEGATIVE

## 2015-06-18 MED ORDER — ALBUTEROL SULFATE (2.5 MG/3ML) 0.083% IN NEBU: 2.5000 mg | INHALATION_SOLUTION | Freq: Four times a day (QID) | RESPIRATORY_TRACT | Status: DC

## 2015-06-18 MED ORDER — SODIUM CHLORIDE 0.9 % IJ SOLN: 3.0000 mL | INTRAMUSCULAR | Status: DC | PRN

## 2015-06-18 MED ORDER — MORPHINE SULFATE ER 15 MG PO TBCR
15.0000 mg | EXTENDED_RELEASE_TABLET | Freq: Two times a day (BID) | ORAL | Status: DC
  Administered 2015-06-18 – 2015-06-21 (×8): 15 mg via ORAL
  Filled 2015-06-18 (×8): qty 1

## 2015-06-18 MED ORDER — ENSURE ENLIVE PO LIQD
237.0000 mL | Freq: Two times a day (BID) | ORAL | Status: DC
  Administered 2015-06-18 – 2015-06-21 (×6): 237 mL via ORAL

## 2015-06-18 MED ORDER — ALBUTEROL SULFATE (2.5 MG/3ML) 0.083% IN NEBU
5.0000 mg | INHALATION_SOLUTION | Freq: Once | RESPIRATORY_TRACT | Status: AC
Start: 2015-06-18 — End: 2015-06-18
  Administered 2015-06-18: 5 mg via RESPIRATORY_TRACT
  Filled 2015-06-18: qty 6

## 2015-06-18 MED ORDER — ALBUTEROL SULFATE (2.5 MG/3ML) 0.083% IN NEBU: 2.5000 mg | INHALATION_SOLUTION | RESPIRATORY_TRACT | Status: DC | PRN

## 2015-06-18 MED ORDER — ONDANSETRON HCL 4 MG/2ML IJ SOLN: 4.0000 mg | Freq: Four times a day (QID) | INTRAMUSCULAR | Status: DC | PRN

## 2015-06-18 MED ORDER — IPRATROPIUM BROMIDE 0.02 % IN SOLN
RESPIRATORY_TRACT | Status: AC
  Administered 2015-06-18: 0.5 mg
  Filled 2015-06-18: qty 2.5

## 2015-06-18 MED ORDER — PIPERACILLIN-TAZOBACTAM 4.5 G IVPB
INTRAVENOUS | Status: AC
  Filled 2015-06-18: qty 100

## 2015-06-18 MED ORDER — ENOXAPARIN SODIUM 40 MG/0.4ML ~~LOC~~ SOLN
40.0000 mg | SUBCUTANEOUS | Status: DC
  Administered 2015-06-18 – 2015-06-21 (×4): 40 mg via SUBCUTANEOUS
  Filled 2015-06-18 (×4): qty 0.4

## 2015-06-18 MED ORDER — ALBUTEROL SULFATE (2.5 MG/3ML) 0.083% IN NEBU
INHALATION_SOLUTION | RESPIRATORY_TRACT | Status: AC
  Administered 2015-06-18: 2.5 mg
  Filled 2015-06-18: qty 3

## 2015-06-18 MED ORDER — VANCOMYCIN HCL IN DEXTROSE 750-5 MG/150ML-% IV SOLN
750.0000 mg | Freq: Two times a day (BID) | INTRAVENOUS | Status: DC
  Administered 2015-06-18 – 2015-06-21 (×6): 750 mg via INTRAVENOUS
  Filled 2015-06-18 (×8): qty 150

## 2015-06-18 MED ORDER — VANCOMYCIN HCL IN DEXTROSE 1-5 GM/200ML-% IV SOLN
1000.0000 mg | Freq: Once | INTRAVENOUS | Status: AC
  Administered 2015-06-18: 1000 mg via INTRAVENOUS
  Filled 2015-06-18: qty 200

## 2015-06-18 MED ORDER — SODIUM CHLORIDE 0.9 % IV SOLN: 250.0000 mL | INTRAVENOUS | Status: DC | PRN

## 2015-06-18 MED ORDER — ONDANSETRON HCL 4 MG PO TABS: 4.0000 mg | ORAL_TABLET | Freq: Four times a day (QID) | ORAL | Status: DC | PRN

## 2015-06-18 MED ORDER — PIPERACILLIN-TAZOBACTAM 3.375 G IVPB
3.3750 g | Freq: Three times a day (TID) | INTRAVENOUS | Status: DC
Start: 2015-06-18 — End: 2015-06-22
  Administered 2015-06-18 – 2015-06-21 (×11): 3.375 g via INTRAVENOUS
  Filled 2015-06-18 (×15): qty 50

## 2015-06-18 MED ORDER — IPRATROPIUM-ALBUTEROL 0.5-2.5 (3) MG/3ML IN SOLN
3.0000 mL | Freq: Once | RESPIRATORY_TRACT | Status: AC
  Administered 2015-06-18: 3 mL via RESPIRATORY_TRACT
  Filled 2015-06-18: qty 3

## 2015-06-18 MED ORDER — SODIUM CHLORIDE 0.9 % IJ SOLN
3.0000 mL | Freq: Two times a day (BID) | INTRAMUSCULAR | Status: DC
  Administered 2015-06-18 – 2015-06-21 (×8): 3 mL via INTRAVENOUS

## 2015-06-18 MED ORDER — OXYCODONE-ACETAMINOPHEN 5-325 MG PO TABS
1.0000 | ORAL_TABLET | ORAL | Status: DC | PRN
  Administered 2015-06-18: 1 via ORAL
  Administered 2015-06-19 – 2015-06-20 (×2): 2 via ORAL
  Filled 2015-06-18: qty 2
  Filled 2015-06-18: qty 1
  Filled 2015-06-18: qty 2

## 2015-06-18 MED ORDER — IPRATROPIUM BROMIDE 0.02 % IN SOLN: 0.5000 mg | Freq: Four times a day (QID) | RESPIRATORY_TRACT | Status: DC

## 2015-06-18 MED ORDER — PIPERACILLIN-TAZOBACTAM 4.5 G IVPB
4.5000 g | Freq: Once | INTRAVENOUS | Status: AC
  Administered 2015-06-18: 4.5 g via INTRAVENOUS
  Filled 2015-06-18: qty 100

## 2015-06-18 MED ORDER — IPRATROPIUM-ALBUTEROL 0.5-2.5 (3) MG/3ML IN SOLN
3.0000 mL | Freq: Four times a day (QID) | RESPIRATORY_TRACT | Status: DC
  Administered 2015-06-18 – 2015-06-22 (×16): 3 mL via RESPIRATORY_TRACT
  Filled 2015-06-18 (×16): qty 3

## 2015-06-18 MED ORDER — METHYLPREDNISOLONE SODIUM SUCC 125 MG IJ SOLR
125.0000 mg | Freq: Once | INTRAMUSCULAR | Status: AC
  Administered 2015-06-18: 125 mg via INTRAVENOUS
  Filled 2015-06-18: qty 2

## 2015-06-18 MED ORDER — METHYLPREDNISOLONE SODIUM SUCC 125 MG IJ SOLR
80.0000 mg | Freq: Two times a day (BID) | INTRAMUSCULAR | Status: DC
  Administered 2015-06-18 – 2015-06-22 (×7): 80 mg via INTRAVENOUS
  Filled 2015-06-18 (×7): qty 2

## 2015-06-18 NOTE — Progress Notes (Signed)
ANTIBIOTIC CONSULT NOTE - INITIAL  Pharmacy Consult for Vancomycin and Zosyn Indication: pneumonia  No Known Allergies  Patient Measurements: Height: '5\' 6"'$  (167.6 cm) Weight: 117 lb 1 oz (53.1 kg) IBW/kg (Calculated) : 63.8  Vital Signs: Temp: 96.7 F (35.9 C) (12/03 0758) Temp Source: Axillary (12/03 0758) BP: 116/73 mmHg (12/03 0600) Pulse Rate: 73 (12/03 0600) Intake/Output from previous day: 12/02 0701 - 12/03 0700 In: 300 [IV Piggyback:300] Out: 0  Intake/Output from this shift:    Labs:  Recent Labs  06/15/15 1550 07/07/2015 0210  WBC 64.7* 47.8*  HGB 8.6* 8.9*  PLT 545* 545*  CREATININE 0.51* 0.58*   Estimated Creatinine Clearance: 81.1 mL/min (by C-G formula based on Cr of 0.58). No results for input(s): VANCOTROUGH, VANCOPEAK, VANCORANDOM, GENTTROUGH, GENTPEAK, GENTRANDOM, TOBRATROUGH, TOBRAPEAK, TOBRARND, AMIKACINPEAK, AMIKACINTROU, AMIKACIN in the last 72 hours.   Microbiology: Recent Results (from the past 720 hour(s))  MRSA PCR Screening     Status: None   Collection Time: 07/13/2015  4:10 AM  Result Value Ref Range Status   MRSA by PCR NEGATIVE NEGATIVE Final    Comment:        The GeneXpert MRSA Assay (FDA approved for NASAL specimens only), is one component of a comprehensive MRSA colonization surveillance program. It is not intended to diagnose MRSA infection nor to guide or monitor treatment for MRSA infections.     Medical History: Past Medical History  Diagnosis Date  . Tobacco use disorder 04/23/2015    Quit Fall 2016  . ETOH abuse     Quit Fall 2016  . Illicit drug use     Quit Fall 2016  . On home O2   . 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 Pleural effusion on right    Anti-infectives    Start     Dose/Rate Route Frequency Ordered Stop   07/15/2015 1800  vancomycin (VANCOCIN) IVPB 750 mg/150 ml premix     750 mg 150 mL/hr over 60 Minutes  Intravenous Every 12 hours 06/17/2015 0750     07/11/2015 1400  piperacillin-tazobactam (ZOSYN) IVPB 3.375 g     3.375 g 12.5 mL/hr over 240 Minutes Intravenous Every 8 hours 06/29/2015 0749     06/17/2015 0300  vancomycin (VANCOCIN) IVPB 1000 mg/200 mL premix     1,000 mg 200 mL/hr over 60 Minutes Intravenous  Once 06/20/2015 0249 07/04/2015 0419   07/11/2015 0300  piperacillin-tazobactam (ZOSYN) IVPB 4.5 g     4.5 g 200 mL/hr over 30 Minutes Intravenous  Once 07/16/2015 0249 07/12/2015 7564      Assessment: 54 yo male h/o adenocarcinoma unknown primary with right sided chronic pleural effusion on 6 liters [place] at home woke up around midnight sob and noticed his electricity was out. He lives with his mother, who is 52 years old who had to go to the neighbors house as their phone was also out to call 911. Upon arrival of EMS his oxygen sats were in the 60s and pt was very tachypneic. bipap was placed. Pt feeling better on bipap. Denies fevers. Pt just started chemo tx last month per his report. asked to initiate Vancomycin and Zosyn for pna  Goal of Therapy:  Vancomycin trough level 15-20 mcg/ml  Plan:  Zosyn 3.375gm IV q8h, EID Vancomycin '750mg'$  IV q12hrs Check trough at steady state Monitor labs, renal fxn, progress and c/s  Hart Robinsons A 06/23/2015,8:06 AM

## 2015-06-18 NOTE — Progress Notes (Signed)
Pt does not have a home supply of Pleur-x drain bottles with him.  House supervisor was notified but was unable to obtain a supply from materials.  Pt said he was unable to reach his family that could bring them in.

## 2015-06-18 NOTE — Progress Notes (Signed)
Initial Nutrition Assessment  DOCUMENTATION CODES:  Severe malnutrition in context of chronic illness  INTERVENTION:  Once diet advanced: Ensure Enlive po BID, each supplement provides 350 kcal and 20 grams of protein  Recommend MVI with minerals for wounds  NUTRITION DIAGNOSIS:  Increased nutrient needs related to cancer and cancer related treatments, wound healing as evidenced by estimated nutritional requirements for the condition  GOAL:  Patient will meet greater than or equal to 90% of their needs  MONITOR:  PO intake, Supplement acceptance, Labs, Weight trends, Diet advancement, plan of care  REASON FOR ASSESSMENT:  Malnutrition Screening Tool    ASSESSMENT:  54 y/o male PMHx stage 4 lung cancer and related chronic pleural effusion (on 6 liters [place]) presents with severe SOB after his power went out. On arrival sats in 69s and tachypneic. Was placed on bipap. Treated for PNA. Overall poor prognosis.   RD operating remotely. Per chart review, pt was dx w/ severe malnutrition on 11/17 due to wt loss and fat loss.   There is no discussion of oral intake. Unable to do physical exam, but pt has lost more weight and the severe fat loss would still apply.   Per past notes, pt likes Ensure [place] and is on regular diet at home.  He has hx of severe wt loss. Has lost >9% bw in 1 month  Diet Order:  Diet NPO time specified   Skin: Cracking, ecchymosis, bed bugs, St/ 2 PU sacrum, generalized bruising  Last BM:  12/2  Height:  Ht Readings from Last 1 Encounters:  07/13/2015 '5\' 6"'$  (1.676 m)   Weight:  Wt Readings from Last 1 Encounters:  06/26/2015 117 lb 1 oz (53.1 kg)   Wt Readings from Last 10 Encounters:  06/27/2015 117 lb 1 oz (53.1 kg)  06/15/15 116 lb 11.2 oz (52.935 kg)  06/01/15 122 lb 5.7 oz (55.5 kg)  05/26/15 123 lb 6.4 oz (55.974 kg)  05/19/15 129 lb 6.4 oz (58.695 kg)  05/11/15 131 lb (59.421 kg)  05/06/15 130 lb (58.968 kg)  04/27/15 150 lb (68.04 kg)    Ideal Body Weight:  64.54 kg  BMI:  Body mass index is 18.9 kg/(m^2).  Estimated Nutritional Needs:  Kcal:  1850-2100 kcals (35-40 kcal/kg) Protein:  90-106 g Fluid:  1.9-2.1 liters fluid  EDUCATION NEEDS:  Education needs no appropriate at this time   Burtis Junes RD, LDN Nutrition Pager: [phone] 07/09/2015 8:49 AM

## 2015-06-18 NOTE — ED Provider Notes (Signed)
CSN: [id number]     Arrival date & time 06/26/2015  0123 History   First MD Initiated Contact with Patient 06/28/2015 0141     Chief Complaint  Patient presents with  . Shortness of Breath     (Consider location/radiation/quality/duration/timing/severity/associated sxs/prior Treatment) HPI Comments: Patient from home by EMS. He awoke with respiratory distress after his power went out. He wears 6 L of oxygen at home for COPD and lung cancer. His last chemotherapy was last month. He denies any chest pain. He endorses shortness of breath and feels he can't breathe. He has had no fever no abdominal pain, nausea or vomiting. Saturations on arrival were 65% on 6 L. Level 5 caveat for acuity of condition  The history is provided by the patient and the EMS personnel. The history is limited by the condition of the patient.    Past Medical History  Diagnosis Date  . Tobacco use disorder 04/23/2015    Quit Fall 2016  . ETOH abuse     Quit Fall 2016  . Illicit drug use     Quit Fall 2016  . On home O2   . 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 Pleural effusion on right    Past Surgical History  Procedure Laterality Date  . Video bronchoscopy Bilateral 04/27/2015    Procedure: VIDEO BRONCHOSCOPY WITH FLUORO;  Surgeon: Juanito Doom, MD;  Location: [facility];  Service: Cardiopulmonary;  Laterality: Bilateral;  . Hernia repair Right 2009    [place]  [facility]  . Colonoscopy with propofol N/A 05/06/2015    Procedure: COLONOSCOPY WITH PROPOFOL; IN CECUM AT 1159; WITHDRAWAL TIME 23 MINUTES;  Surgeon: Danie Binder, MD;  Location: [facility];  Service: Endoscopy;  Laterality: N/A;  . Esophagogastroduodenoscopy (egd) with propofol N/A 05/06/2015    Procedure: ESOPHAGOGASTRODUODENOSCOPY (EGD) WITH PROPOFOL;  Surgeon: Danie Binder, MD;  Location: [facility];  Service: Endoscopy;  Laterality: N/A;  . Portacath  placement Right 05/11/2015    Procedure: INSERTION PORT-A-CATH;  Surgeon: Aviva Signs, MD;  Location: [facility];  Service: General;  Laterality: Right;  . Plurex catheter Right    Family History  Problem Relation Age of Onset  . Diabetes Mother    Social History  Substance Use Topics  . Smoking status: Former Smoker -- 2.00 packs/day for 40 years    Types: Cigarettes    Quit date: 04/22/2015  . Smokeless tobacco: Never Used  . Alcohol Use: 3.6 oz/week    6 Cans of beer per week     Comment: Quit in fall 2016    Review of Systems  Constitutional: Negative for fever, activity change and appetite change.  HENT: Negative for congestion and nosebleeds.   Eyes: Negative for visual disturbance.  Respiratory: Positive for cough and shortness of breath.   Cardiovascular: Negative for chest pain.  Gastrointestinal: Negative for nausea and vomiting.  Genitourinary: Negative for dysuria, hematuria and testicular pain.  Musculoskeletal: Negative for myalgias and arthralgias.  Skin: Negative for wound.  Neurological: Negative for dizziness, weakness and headaches.  A complete 10 system review of systems was obtained and all systems are negative except as noted in the HPI and PMH.      Allergies  Review of patient's allergies indicates no known allergies.  Home Medications   Prior to Admission medications   Medication Sig Start Date End Date Taking? Authorizing Provider  albuterol (PROVENTIL HFA;VENTOLIN HFA) 108 (90  BASE) MCG/ACT inhaler Inhale 2 puffs into the lungs every 6 (six) hours as needed for wheezing or shortness of breath. 05/26/15  Yes Manon Hilding Kefalas, PA-C  amoxicillin-clavulanate (AUGMENTIN) 500-125 MG tablet Take 1 tablet (500 mg total) by mouth 3 (three) times daily. 06/08/15  Yes Rosita Fire, MD  CARBOPLATIN IV Inject into the vein. Every 21 days   Yes Historical Provider, MD  dexamethasone (DECADRON) 4 MG tablet The day before chemo take 5 tabs in the am and 5 tabs in  the pm. The morning of chemo take 5 tabs prior to chemo appt. 06/01/15  Yes Patrici Ranks, MD  fluconazole (DIFLUCAN) 100 MG tablet Take 1 tablet (100 mg total) by mouth daily. 05/19/15  Yes Patrici Ranks, MD  Fluticasone-Salmeterol (ADVAIR DISKUS) 250-50 MCG/DOSE AEPB Inhale 1 puff into the lungs 2 (two) times daily. 05/14/15  Yes Sinda Du, MD  guaiFENesin (MUCINEX) 600 MG 12 hr tablet Take 1 tablet (600 mg total) by mouth 2 (two) times daily as needed. 06/15/15  Yes Baird Cancer, PA-C  lidocaine-prilocaine (EMLA) cream Apply a quarter size amount to port site 1 hour prior to chemo. Do not rub in. Cover with plastic wrap. 06/01/15  Yes Patrici Ranks, MD  megestrol (MEGACE) 400 MG/10ML suspension Take 10 mLs (400 mg total) by mouth 2 (two) times daily. 06/15/15  Yes Manon Hilding Kefalas, PA-C  morphine (MS CONTIN) 15 MG 12 hr tablet Take 1 tablet (15 mg total) by mouth every 12 (twelve) hours. 06/08/15  Yes Manon Hilding Kefalas, PA-C  ondansetron (ZOFRAN) 8 MG tablet Take 1 tablet (8 mg total) by mouth every 8 (eight) hours as needed for nausea or vomiting. 06/01/15  Yes Patrici Ranks, MD  oxyCODONE-acetaminophen (PERCOCET/ROXICET) 5-325 MG tablet Take 1-2 tablets by mouth every 4 (four) hours as needed for moderate pain or severe pain. 06/15/15  Yes Manon Hilding Kefalas, PA-C  predniSONE (STERAPRED UNI-PAK 21 TAB) 10 MG (21) TBPK tablet Take 1 tablet (10 mg total) by mouth daily. 4 tab po daily for b3 days, then 3 tab po daily for 3 days, 2 tab po daily for 3 days and 1 tab po daily for 3 days, 06/08/15  Yes Rosita Fire, MD  temazepam (RESTORIL) 15 MG capsule Take 1 capsule (15 mg total) by mouth at bedtime as needed for sleep. 06/15/15  Yes Thomas S Kefalas, PA-C   BP 102/71 mmHg  Pulse 93  Temp(Src) 97.6 F (36.4 C) (Oral)  Resp 24  Ht '5\' 6"'$  (1.676 m)  Wt 117 lb 1 oz (53.1 kg)  BMI 18.90 kg/m2  SpO2 100% Physical Exam  Constitutional: He is oriented to person, place, and time.  He appears well-developed and well-nourished. He appears distressed.  Thin, chronically ill appearing  HENT:  Head: Normocephalic and atraumatic.  Mouth/Throat: Oropharynx is clear and moist. No oropharyngeal exudate.  Eyes: Conjunctivae and EOM are normal. Pupils are equal, round, and reactive to light.  Neck: Normal range of motion. Neck supple.  No meningismus.  Cardiovascular: Normal rate, regular rhythm, normal heart sounds and intact distal pulses.   No murmur heard. Pulmonary/Chest: He is in respiratory distress. He has wheezes.  Essentially absent breath sounds on the right. Diminished breath sounds on the left with scattered wheezing.  Right-sided pleural catheter in place  Abdominal: Soft. There is no tenderness. There is no rebound and no guarding.  Musculoskeletal: Normal range of motion. He exhibits no edema or tenderness.  Neurological: He is  alert and oriented to person, place, and time. No cranial nerve deficit. He exhibits normal muscle tone. Coordination normal.  No ataxia on finger to nose bilaterally. No pronator drift. 5/5 strength throughout. CN 2-12 intact.Equal grip strength. Sensation intact.   Skin: Skin is warm.  Psychiatric: He has a normal mood and affect. His behavior is normal.  Nursing note and vitals reviewed.   ED Course  Procedures (including critical care time) Labs Review Labs Reviewed  BASIC METABOLIC PANEL - Abnormal; Notable for the following:    Sodium 132 (*)    Chloride 87 (*)    CO2 34 (*)    Glucose, Bld 114 (*)    Creatinine, Ser 0.58 (*)    All other components within normal limits  CBC - Abnormal; Notable for the following:    WBC 47.8 (*)    RBC 3.28 (*)    Hemoglobin 8.9 (*)    HCT 28.3 (*)    RDW 17.8 (*)    Platelets 545 (*)    All other components within normal limits  BRAIN NATRIURETIC PEPTIDE - Abnormal; Notable for the following:    B Natriuretic Peptide 310.0 (*)    All other components within normal limits   PROTIME-INR - Abnormal; Notable for the following:    Prothrombin Time 15.5 (*)    All other components within normal limits  BLOOD GAS, ARTERIAL - Abnormal; Notable for the following:    pH, Arterial 7.272 (*)    pCO2 arterial 80.8 (*)    Bicarbonate 31.6 (*)    Acid-Base Excess 9.2 (*)    All other components within normal limits  MRSA PCR SCREENING  TROPONIN I  BLOOD GAS, ARTERIAL  I-STAT CG4 LACTIC ACID, ED    Imaging Review Dg Chest Portable 1 View  06/26/2015  CLINICAL DATA:  Dyspnea. EXAM: PORTABLE CHEST 1 VIEW COMPARISON:  06/06/2015 FINDINGS: Right subclavian Port-A-Cath appears unchanged in position. Near complete opacification of the right hemi thorax persists without significant change. Severe emphysematous changes in the left apex. There is worsening consolidation in the left midlung and left base. IMPRESSION: Worsening consolidation in the left midlung and left base. Unchanged right hemi thorax opacification. Electronically Signed   By: Andreas Newport M.D.   On: 07/10/2015 02:10   I have personally reviewed and evaluated these images and lab results as part of my medical decision-making.   EKG Interpretation   Date/Time:  Saturday June 18 2015 01:51:12 EST Ventricular Rate:  85 PR Interval:  133 QRS Duration: 85 QT Interval:  373 QTC Calculation: 443 R Axis:   83 Text Interpretation:  Sinus rhythm Minimal ST depression, inferior leads  Artifact Confirmed by Wyvonnia Dusky  MD, Philip Eckersley ([id number]) on 06/28/2015 2:18:20 AM      MDM   Final diagnoses:  Acute hypercapnic respiratory failure ([place])   Patient with hypoxia and shortness of breath and history of lung cancer. Recent admission for pneumonia.  Patient hypoxic on arrival and placed on nonrebreather. Nebs and steroids given.  ABG shows respiratory acidosis with hypercarbia and CO2 retention. He is placed on BiPAP. Chest x-ray shows worsening consolidation in the left side which unchanged right hemithorax  opacification.  Patient states he does not want to be intubated or placed on the ventilator.  Broad spectrum antibiotics started.  HCAP versus worsening metastatic disease.  Recent CT negative for PE. Chronic leukocytosis on labs.  PAtient states he is DNR.  Comfortable on bipap.  Admission d/w Dr. Shanon Brow.  CRITICAL CARE  Performed by: Ezequiel Essex Total critical care time: 40 minutes Critical care time was exclusive of separately billable procedures and treating other patients. Critical care was necessary to treat or prevent imminent or life-threatening deterioration. Critical care was time spent personally by me on the following activities: development of treatment plan with patient and/or surrogate as well as nursing, discussions with consultants, evaluation of patient's response to treatment, examination of patient, obtaining history from patient or surrogate, ordering and performing treatments and interventions, ordering and review of laboratory studies, ordering and review of radiographic studies, pulse oximetry and re-evaluation of patient's condition.   Ezequiel Essex, MD 07/16/2015 [phone]

## 2015-06-18 NOTE — Progress Notes (Signed)
Subjective: Patient is readmitted due to shortness of breath. He is on BIPAP and IV antibiotics  Objective: Vital signs in last 24 hours: Temp:  [96.7 F (35.9 C)-97.6 F (36.4 C)] 96.7 F (35.9 C) (12/03 0758) Pulse Rate:  [73-97] 81 (12/03 0859) Resp:  [12-28] 18 (12/03 0859) BP: (102-130)/(67-74) 116/73 mmHg (12/03 0600) SpO2:  [72 %-100 %] 98 % (12/03 0859) FiO2 (%):  [50 %] 50 % (12/03 0858) Weight:  [53.1 kg (117 lb 1 oz)-54.432 kg (120 lb)] 53.1 kg (117 lb 1 oz) (12/03 0453) Weight change:  Last BM Date: 06/17/15  Intake/Output from previous day: 12/02 0701 - 12/03 0700 In: 300 [IV Piggyback:300] Out: 0   PHYSICAL EXAM General appearance: cachectic, moderate distress and slowed mentation Resp: diminished breath sounds bilaterally and rhonchi bilaterally Cardio: S1, S2 normal GI: soft, non-tender; bowel sounds normal; no masses,  no organomegaly Extremities: extremities normal, atraumatic, no cyanosis or edema  Lab Results:  Results for orders placed or performed during the hospital encounter of 07/15/2015 (from the past 48 hour(s))  Blood gas, arterial ([facility] ONLY)     Status: Abnormal   Collection Time: 06/28/2015  1:55 AM  Result Value Ref Range   FIO2 1.00    Delivery systems NON-REBREATHER OXYGEN MASK    pH, Arterial 7.272 (L) 7.350 - 7.450   pCO2 arterial 80.8 (HH) 35.0 - 45.0 mmHg    Comment: CRITICAL RESULT CALLED TO, READ BACK BY AND VERIFIED WITH: S.YARBOROUGH,RN BY B.STOPHEL,RRT,RCP ON 07/04/2015 AT 2:01.    pO2, Arterial 94.5 80.0 - 100.0 mmHg   Bicarbonate 31.6 (H) 20.0 - 24.0 mEq/L   Acid-Base Excess 9.2 (H) 0.0 - 2.0 mmol/L   O2 Saturation 95.6 %   Patient temperature 37.0    Collection site RIGHT RADIAL    Drawn by [id number]    Sample type ARTERIAL DRAW    Allens test (pass/fail) PASS PASS  Basic metabolic panel     Status: Abnormal   Collection Time: 07/16/2015  2:10 AM  Result Value Ref Range   Sodium 132 (L) 135 - 145 mmol/L   Potassium 4.3 3.5 -  5.1 mmol/L   Chloride 87 (L) 101 - 111 mmol/L   CO2 34 (H) 22 - 32 mmol/L   Glucose, Bld 114 (H) 65 - 99 mg/dL   BUN 15 6 - 20 mg/dL   Creatinine, Ser 0.58 (L) 0.61 - 1.24 mg/dL   Calcium 9.4 8.9 - 10.3 mg/dL   GFR calc non Af Amer >60 >60 mL/min   GFR calc Af Amer >60 >60 mL/min    Comment: (NOTE) The eGFR has been calculated using the CKD EPI equation. This calculation has not been validated in all clinical situations. eGFR's persistently <60 mL/min signify possible Chronic Kidney Disease.    Anion gap 11 5 - 15  CBC     Status: Abnormal   Collection Time: 07/09/2015  2:10 AM  Result Value Ref Range   WBC 47.8 (H) 4.0 - 10.5 K/uL   RBC 3.28 (L) 4.22 - 5.81 MIL/uL   Hemoglobin 8.9 (L) 13.0 - 17.0 g/dL   HCT 28.3 (L) 39.0 - 52.0 %   MCV 86.3 78.0 - 100.0 fL   MCH 27.1 26.0 - 34.0 pg   MCHC 31.4 30.0 - 36.0 g/dL   RDW 17.8 (H) 11.5 - 15.5 %   Platelets 545 (H) 150 - 400 K/uL  Troponin I     Status: None   Collection Time: 07/13/2015  2:10 AM  Result Value Ref Range   Troponin I <0.03 <0.031 ng/mL    Comment:        NO INDICATION OF MYOCARDIAL INJURY.   Brain natriuretic peptide     Status: Abnormal   Collection Time: 06/21/2015  2:10 AM  Result Value Ref Range   B Natriuretic Peptide 310.0 (H) 0.0 - 100.0 pg/mL  Protime-INR     Status: Abnormal   Collection Time: 07/15/2015  2:10 AM  Result Value Ref Range   Prothrombin Time 15.5 (H) 11.6 - 15.2 seconds   INR 1.22 0.00 - 1.49  I-Stat CG4 Lactic Acid, ED     Status: None   Collection Time: 07/10/2015  3:00 AM  Result Value Ref Range   Lactic Acid, Venous 1.71 0.5 - 2.0 mmol/L  MRSA PCR Screening     Status: None   Collection Time: 06/19/2015  4:10 AM  Result Value Ref Range   MRSA by PCR NEGATIVE NEGATIVE    Comment:        The GeneXpert MRSA Assay (FDA approved for NASAL specimens only), is one component of a comprehensive MRSA colonization surveillance program. It is not intended to diagnose MRSA infection nor to guide  or monitor treatment for MRSA infections.   Blood gas, arterial     Status: Abnormal   Collection Time: 07/09/2015  6:25 AM  Result Value Ref Range   FIO2 0.50    Delivery systems BILEVEL POSITIVE AIRWAY PRESSURE    Mode OXYGEN MASK    LHR 10 resp/min   Inspiratory PAP 12    Expiratory PAP 6.0    pH, Arterial 7.337 (L) 7.350 - 7.450   pCO2 arterial 70.8 (HH) 35.0 - 45.0 mmHg    Comment: CRITICAL RESULT CALLED TO, READ BACK BY AND VERIFIED WITH:  TO NELSON T RN AT 0630 07/06/2015 BY [facility] RRT    pO2, Arterial 84.1 80.0 - 100.0 mmHg   Bicarbonate 33.5 (H) 20.0 - 24.0 mEq/L   Acid-Base Excess 10.8 (H) 0.0 - 2.0 mmol/L   O2 Saturation 95.0 %   Collection site RADIAL    Drawn by [id number]    Sample type ARTERIAL    Allens test (pass/fail) PASS PASS    ABGS  Recent Labs  06/23/2015 0625  PHART 7.337*  PO2ART 84.1  HCO3 33.5*   CULTURES Recent Results (from the past 240 hour(s))  MRSA PCR Screening     Status: None   Collection Time: 07/11/2015  4:10 AM  Result Value Ref Range Status   MRSA by PCR NEGATIVE NEGATIVE Final    Comment:        The GeneXpert MRSA Assay (FDA approved for NASAL specimens only), is one component of a comprehensive MRSA colonization surveillance program. It is not intended to diagnose MRSA infection nor to guide or monitor treatment for MRSA infections.    Studies/Results: Dg Chest Portable 1 View  07/13/2015  CLINICAL DATA:  Dyspnea. EXAM: PORTABLE CHEST 1 VIEW COMPARISON:  06/06/2015 FINDINGS: Right subclavian Port-A-Cath appears unchanged in position. Near complete opacification of the right hemi thorax persists without significant change. Severe emphysematous changes in the left apex. There is worsening consolidation in the left midlung and left base. IMPRESSION: Worsening consolidation in the left midlung and left base. Unchanged right hemi thorax opacification. Electronically Signed   By: Andreas Newport M.D.   On: 07/11/2015 02:10     Medications: I have reviewed the patient's current medications.  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

## 2015-06-18 NOTE — Plan of Care (Signed)
Problem: Phase I Progression Outcomes Goal: Flu/PneumoVaccines if indicated Outcome: Progressing refused

## 2015-06-18 NOTE — Progress Notes (Signed)
CRITICAL VALUE ALERT  Critical value received:  pCO2 70.8   Date of notification:  07/03/2015  Time of notification:  0652  Critical value read back: yes  Nurse who received alert:  Candiss Norse RN  MD notified (1st page):  Dr. Loralee Pacas  Time of first page:  [phone]  MD notified (2nd page):  Time of second page:  Responding MD:  Update   Time MD responded:  Value lower from (80.8)

## 2015-06-18 NOTE — H&P (Signed)
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  He lives with his mother, who is 11 years old who had to go to the neighbors house as their phone was also out to call 911.  Upon arrival of EMS his oxygen sats were in the 60s and pt was very tachypneic.  bipap was placed.  Pt feeling better on bipap.  Denies fevers.  Pt just started chemo tx last month per his report.  He does not wish intubation.  Tried a trial off bipap during my history while on 6 liters and he still desat in the 70s.  bipap placed back on.  Oxygen sats came back up with bipap.  No other history obtainable due to increased WOB.  Review of Systems:  Positive and negative as per HPI otherwise all other systems are negative but limited due to respiratory issues  Past Medical History: Past Medical History  Diagnosis Date  . Tobacco use disorder 04/23/2015    Quit Fall 2016  . ETOH abuse     Quit Fall 2016  . Illicit drug use     Quit Fall 2016  . On home O2   . Adenocarcinoma of unknown primary, CancerType ID showing 89% probability of Gastroesophageal Adenocarcinoma ([place])   . Pleural effusion on right    Past Surgical History  Procedure Laterality Date  . Video bronchoscopy Bilateral 04/27/2015    Procedure: VIDEO BRONCHOSCOPY WITH FLUORO;  Surgeon: Juanito Doom, MD;  Location: [facility];  Service: Cardiopulmonary;  Laterality: Bilateral;  . Hernia repair Right 2009    [facility]ia [facility] [place]  [facility]  . Colonoscopy with propofol N/A 05/06/2015    Procedure: COLONOSCOPY WITH PROPOFOL; IN CECUM AT 1159; WITHDRAWAL TIME 23 MINUTES;  Surgeon: Danie Binder, MD;  Location: [facility];  Service: Endoscopy;  Laterality: N/A;  . Esophagogastroduodenoscopy (egd) with propofol N/A 05/06/2015    Procedure: ESOPHAGOGASTRODUODENOSCOPY (EGD)  WITH PROPOFOL;  Surgeon: Danie Binder, MD;  Location: [facility];  Service: Endoscopy;  Laterality: N/A;  . Portacath placement Right 05/11/2015    Procedure: INSERTION PORT-A-CATH;  Surgeon: Aviva Signs, MD;  Location: [facility];  Service: General;  Laterality: Right;  . Plurex catheter Right     Medications: Prior to Admission medications   Medication Sig Start Date End Date Taking? Authorizing Provider  albuterol (PROVENTIL HFA;VENTOLIN HFA) 108 (90 BASE) MCG/ACT inhaler Inhale 2 puffs into the lungs every 6 (six) hours as needed for wheezing or shortness of breath. 05/26/15  Yes Manon Hilding Kefalas, PA-C  amoxicillin-clavulanate (AUGMENTIN) 500-125 MG tablet Take 1 tablet (500 mg total) by mouth 3 (three) times daily. 06/08/15  Yes Rosita Fire, MD  CARBOPLATIN IV Inject into the vein. Every 21 days   Yes Historical Provider, MD  dexamethasone (DECADRON) 4 MG tablet The day before chemo take 5 tabs in the am and 5 tabs in the pm. The morning of chemo take 5 tabs prior to chemo appt. 06/01/15  Yes Patrici Ranks, MD  fluconazole (DIFLUCAN) 100 MG tablet Take 1 tablet (100 mg total) by mouth daily. 05/19/15  Yes Patrici Ranks, MD  Fluticasone-Salmeterol (ADVAIR DISKUS) 250-50 MCG/DOSE AEPB Inhale 1 puff into the lungs 2 (two) times daily. 05/14/15  Yes Sinda Du, MD  guaiFENesin ([place]) 600 MG 12  hr tablet Take 1 tablet (600 mg total) by mouth 2 (two) times daily as needed. 06/15/15  Yes Baird Cancer, PA-C  lidocaine-prilocaine (EMLA) cream Apply a quarter size amount to port site 1 hour prior to chemo. Do not rub in. Cover with plastic wrap. 06/01/15  Yes Patrici Ranks, MD  megestrol (MEGACE) 400 MG/10ML suspension Take 10 mLs (400 mg total) by mouth 2 (two) times daily. 06/15/15  Yes Manon Hilding Kefalas, PA-C  morphine (MS CONTIN) 15 MG 12 hr tablet Take 1 tablet (15 mg total) by mouth every 12 (twelve) hours. 06/08/15  Yes Manon Hilding Kefalas, PA-C  ondansetron (ZOFRAN) 8 MG tablet  Take 1 tablet (8 mg total) by mouth every 8 (eight) hours as needed for nausea or vomiting. 06/01/15  Yes Patrici Ranks, MD  oxyCODONE-acetaminophen (PERCOCET/ROXICET) 5-325 MG tablet Take 1-2 tablets by mouth every 4 (four) hours as needed for moderate pain or severe pain. 06/15/15  Yes Manon Hilding Kefalas, PA-C  predniSONE (STERAPRED UNI-PAK 21 TAB) 10 MG (21) TBPK tablet Take 1 tablet (10 mg total) by mouth daily. 4 tab po daily for b3 days, then 3 tab po daily for 3 days, 2 tab po daily for 3 days and 1 tab po daily for 3 days, 06/08/15  Yes Rosita Fire, MD  temazepam (RESTORIL) 15 MG capsule Take 1 capsule (15 mg total) by mouth at bedtime as needed for sleep. 06/15/15  Yes Baird Cancer, PA-C    Allergies:  No Known Allergies  Social History:  reports that he quit smoking about 8 weeks ago. His smoking use included Cigarettes. He has a 80 pack-year smoking history. He has never used smokeless tobacco. He reports that he drinks about 3.6 oz of alcohol per week. He reports that he uses illicit drugs (Cocaine and Marijuana).  Family History: Family History  Problem Relation Age of Onset  . Diabetes Mother     Physical Exam: Filed Vitals:   06/28/2015 0230 07/05/2015 0245 07/09/2015 0300 06/25/2015 0330  BP: 120/67  112/69 102/71  Pulse: 97 89    Temp:      TempSrc:      Resp: '14 23  12  '$ Height:      Weight:      SpO2: 100% 100%  96%   General appearance: alert, cooperative and moderate distress Head: Normocephalic, without obvious abnormality, atraumatic Eyes: negative Nose: Nares normal. Septum midline. Mucosa normal. No drainage or sinus tenderness. Neck: no JVD and supple, symmetrical, trachea midline Lungs: diminished breath sounds RLL, RML and RUL Heart: regular rate and rhythm, S1, S2 normal, no murmur, click, rub or gallop Abdomen: soft, non-tender; bowel sounds normal; no masses,  no organomegaly Extremities: extremities normal, atraumatic, no cyanosis or edema Pulses:  2+ and symmetric Skin: Skin color, texture, turgor normal. No rashes or lesions Neurologic: Grossly normal    Labs on Admission:   Recent Labs  06/15/15 1550 06/26/2015 0210  NA 134* 132*  K 4.5 4.3  CL 91* 87*  CO2 34* 34*  GLUCOSE 93 114*  BUN 19 15  CREATININE 0.51* 0.58*  CALCIUM 9.3 9.4    Recent Labs  06/15/15 1550  AST 19  ALT 16*  ALKPHOS 221*  BILITOT 0.5  PROT 6.3*  ALBUMIN 2.5*    Recent Labs  06/15/15 1550 07/15/2015 0210  WBC 64.7* 47.8*  NEUTROABS 61.7*  --   HGB 8.6* 8.9*  HCT 28.0* 28.3*  MCV 85.4 86.3  PLT 545* 545*  Recent Labs  07/16/2015 0210  TROPONINI <0.03   Radiological Exams on Admission: Dg Chest Portable 1 View  06/25/2015  CLINICAL DATA:  Dyspnea. EXAM: PORTABLE CHEST 1 VIEW COMPARISON:  06/06/2015 FINDINGS: Right subclavian Port-A-Cath appears unchanged in position. Near complete opacification of the right hemi thorax persists without significant change. Severe emphysematous changes in the left apex. There is worsening consolidation in the left midlung and left base. IMPRESSION: Worsening consolidation in the left midlung and left base. Unchanged right hemi thorax opacification. Electronically Signed   By: Andreas Newport M.D.   On: 06/20/2015 02:10    Assessment/Plan  54 yo male with acute on chronic respiratory failure with h/o metastatic adenocarcinoma, chronic rt pleural effusion with worsening infiltrates bilaterally  Principal Problem:   Acute respiratory failure with hypoxia ([place])- and hypercapnea.  Likely multifactorial.  Suspect this is not all due to lack of electricity as he is still requiring bipap.  Cont bipap.  Reck abg at 5am.  Place on abx vanc and zosyn to cover for possible pna.   Active Problems:   Pleural effusion on right-  Noted, catheter in place.  Consider repeat thoracentesis however family not draining much on daily basis.   COPD exacerbation ([place])- freq nebs.  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  He wishes no intubation or cpr.  Agrees to abx and bipap.  PCP is dr Legrand Rams.  Tylen Leverich A 07/02/2015, 3:42 AM

## 2015-06-18 NOTE — Progress Notes (Signed)
Patient placed on BIPAP after ABG results. Dr. Wyvonnia Dusky notified.

## 2015-06-18 NOTE — Consult Note (Signed)
Consult requested by: Dr. Legrand Rams Consult requested for respiratory failure:  HPI: This is a 54 year old who has a known history of COPD chronic hypoxic respiratory failure and adenocarcinoma of unknown primary. He has been on chemotherapy for about 2 weeks. He has a malignant right pleural effusion which has a Pleurx catheter and is drained periodically. He had been in his usual state of poor health at home when He woke up because he was short of breath. He said he found out that his electricity was out. He called EMS and when they found him he was in acute respiratory distress very tachypnea and his oxygen saturation was in the 60s on oxygen. He has a high flow nasal cannula at home. He was placed on BiPAP and brought to the hospital. He has still been having trouble with low O2 saturations when he comes off BiPAP. He has not really been having much cough no fever. His white blood cell count is up but this is because of bone marrow stimulation after his chemotherapy  Past Medical History  Diagnosis Date  . Tobacco use disorder 04/23/2015    Quit Fall 2016  . ETOH abuse     Quit Fall 2016  . Illicit drug use     Quit Fall 2016  . On home O2   . Adenocarcinoma of unknown primary, CancerType ID showing 89% probability of Gastroesophageal Adenocarcinoma ([place])   . Pleural effusion on right      Family History  Problem Relation Age of Onset  . Diabetes Mother      Social History   Social History  . Marital Status: Married    Spouse Name: N/A  . Number of Children: N/A  . Years of Education: N/A   Occupational History  . Concrete work    Social History Main Topics  . Smoking status: Former Smoker -- 2.00 packs/day for 40 years    Types: Cigarettes    Quit date: 04/22/2015  . Smokeless tobacco: Former Systems developer  . Alcohol Use: 3.6 oz/week    6 Cans of beer per week     Comment: Quit in fall 2016  . Drug Use: Yes    Special: Cocaine, Marijuana     Comment: Quit in Fall 2016  . Sexual  Activity: Not Asked   Other Topics Concern  . None   Social History Narrative     ROS: Not obtainable   Objective: Vital signs in last 24 hours: Temp:  [96.7 F (35.9 C)-97.6 F (36.4 C)] 96.7 F (35.9 C) (12/03 0758) Pulse Rate:  [73-97] 81 (12/03 0859) Resp:  [12-28] 18 (12/03 0859) BP: (102-130)/(67-74) 116/73 mmHg (12/03 0600) SpO2:  [72 %-100 %] 98 % (12/03 0859) FiO2 (%):  [50 %] 50 % (12/03 0858) Weight:  [53.1 kg (117 lb 1 oz)-54.432 kg (120 lb)] 53.1 kg (117 lb 1 oz) (12/03 0453) Weight change:  Last BM Date: 06/17/15  Intake/Output from previous day: 12/02 0701 - 12/03 0700 In: 300 [IV Piggyback:300] Out: 0   PHYSICAL EXAM He is awake and alert and in mild respiratory distress. He is on BiPAP. His pupils are reactive. His nose and throat are clear. His chest shows rales on the left and diminished breath sounds on the right. His heart is regular without gallop. His abdomen is soft with no masses. Extremities show no edema. Central nervous system examination is grossly intact.  Lab Results: Basic Metabolic Panel:  Recent Labs  06/15/15 1550 07/08/2015 0210  NA 134* 132*  K 4.5 4.3  CL 91* 87*  CO2 34* 34*  GLUCOSE 93 114*  BUN 19 15  CREATININE 0.51* 0.58*  CALCIUM 9.3 9.4   Liver Function Tests:  Recent Labs  06/15/15 1550  AST 19  ALT 16*  ALKPHOS 221*  BILITOT 0.5  PROT 6.3*  ALBUMIN 2.5*   No results for input(s): LIPASE, AMYLASE in the last 72 hours. No results for input(s): AMMONIA in the last 72 hours. CBC:  Recent Labs  06/15/15 1550 07/02/2015 0210  WBC 64.7* 47.8*  NEUTROABS 61.7*  --   HGB 8.6* 8.9*  HCT 28.0* 28.3*  MCV 85.4 86.3  PLT 545* 545*   Cardiac Enzymes:  Recent Labs  06/29/2015 0210  TROPONINI <0.03   BNP: No results for input(s): PROBNP in the last 72 hours. D-Dimer: No results for input(s): DDIMER in the last 72 hours. CBG: No results for input(s): GLUCAP in the last 72 hours. Hemoglobin A1C: No  results for input(s): HGBA1C in the last 72 hours. Fasting Lipid Panel: No results for input(s): CHOL, HDL, LDLCALC, TRIG, CHOLHDL, LDLDIRECT in the last 72 hours. Thyroid Function Tests: No results for input(s): TSH, T4TOTAL, FREET4, T3FREE, THYROIDAB in the last 72 hours. Anemia Panel: No results for input(s): VITAMINB12, FOLATE, FERRITIN, TIBC, IRON, RETICCTPCT in the last 72 hours. Coagulation:  Recent Labs  07/04/2015 0210  LABPROT 15.5*  INR 1.22   Urine Drug Screen: Drugs of Abuse  No results found for: LABOPIA, COCAINSCRNUR, LABBENZ, AMPHETMU, THCU, LABBARB  Alcohol Level: No results for input(s): ETH in the last 72 hours. Urinalysis: No results for input(s): COLORURINE, LABSPEC, PHURINE, GLUCOSEU, HGBUR, BILIRUBINUR, KETONESUR, PROTEINUR, UROBILINOGEN, NITRITE, LEUKOCYTESUR in the last 72 hours.  Invalid input(s): APPERANCEUR Misc. Labs:   ABGS:  Recent Labs  07/08/2015 0625  PHART 7.337*  PO2ART 84.1  HCO3 33.5*     MICROBIOLOGY: Recent Results (from the past 240 hour(s))  MRSA PCR Screening     Status: None   Collection Time: 06/19/2015  4:10 AM  Result Value Ref Range Status   MRSA by PCR NEGATIVE NEGATIVE Final    Comment:        The GeneXpert MRSA Assay (FDA approved for NASAL specimens only), is one component of a comprehensive MRSA colonization surveillance program. It is not intended to diagnose MRSA infection nor to guide or monitor treatment for MRSA infections.     Studies/Results: Dg Chest Portable 1 View  07/12/2015  CLINICAL DATA:  Dyspnea. EXAM: PORTABLE CHEST 1 VIEW COMPARISON:  06/06/2015 FINDINGS: Right subclavian Port-A-Cath appears unchanged in position. Near complete opacification of the right hemi thorax persists without significant change. Severe emphysematous changes in the left apex. There is worsening consolidation in the left midlung and left base. IMPRESSION: Worsening consolidation in the left midlung and left base. Unchanged  right hemi thorax opacification. Electronically Signed   By: Andreas Newport M.D.   On: 07/15/2015 02:10    Medications:  Prior to Admission:  Prescriptions prior to admission  Medication Sig Dispense Refill Last Dose  . albuterol (PROVENTIL HFA;VENTOLIN HFA) 108 (90 BASE) MCG/ACT inhaler Inhale 2 puffs into the lungs every 6 (six) hours as needed for wheezing or shortness of breath. 1 Inhaler 2 Taking  . amoxicillin-clavulanate (AUGMENTIN) 500-125 MG tablet Take 1 tablet (500 mg total) by mouth 3 (three) times daily. 30 tablet 0 Taking  . CARBOPLATIN IV Inject into the vein. Every 21 days   Taking  . dexamethasone (DECADRON) 4 MG tablet The  day before chemo take 5 tabs in the am and 5 tabs in the pm. The morning of chemo take 5 tabs prior to chemo appt. 45 tablet 1 Not Taking  . fluconazole (DIFLUCAN) 100 MG tablet Take 1 tablet (100 mg total) by mouth daily. 14 tablet 0 Not Taking  . Fluticasone-Salmeterol (ADVAIR DISKUS) 250-50 MCG/DOSE AEPB Inhale 1 puff into the lungs 2 (two) times daily. 60 each 12 Taking  . guaiFENesin (MUCIN[place]) 600 MG 12 hr tablet Take 1 tablet (600 mg total) by mouth 2 (two) times daily as needed. 30 tablet 2   . lidocaine-prilocaine (EMLA) cream Apply a quarter size amount to port site 1 hour prior to chemo. Do not rub in. Cover with plastic wrap. 30 g 2 Taking  . megestrol (MEGACE) 400 MG/10ML suspension Take 10 mLs (400 mg total) by mouth 2 (two) times daily. 300 mL 1   . morphine (MS CONTIN) 15 MG 12 hr tablet Take 1 tablet (15 mg total) by mouth every 12 (twelve) hours. 60 tablet 0 Taking  . ondansetron (ZOFRAN) 8 MG tablet Take 1 tablet (8 mg total) by mouth every 8 (eight) hours as needed for nausea or vomiting. 30 tablet 2 Not Taking  . oxyCODONE-acetaminophen (PERCOCET/ROXICET) 5-325 MG tablet Take 1-2 tablets by mouth every 4 (four) hours as needed for moderate pain or severe pain. 100 tablet 0   . predniSONE (STERAPRED UNI-PAK 21 TAB) 10 MG (21) TBPK tablet  Take 1 tablet (10 mg total) by mouth daily. 4 tab po daily for b3 days, then 3 tab po daily for 3 days, 2 tab po daily for 3 days and 1 tab po daily for 3 days, 30 tablet 0 Taking  . temazepam (RESTORIL) 15 MG capsule Take 1 capsule (15 mg total) by mouth at bedtime as needed for sleep. 30 capsule 1    Scheduled: . enoxaparin (LOVENOX) injection  40 mg Subcutaneous Q24H  . feeding supplement (ENSURE ENLIVE)  237 mL Oral BID BM  . ipratropium-albuterol  3 mL Nebulization Q6H  . methylPREDNISolone (SOLU-MEDROL) injection  80 mg Intravenous Q12H  . morphine  15 mg Oral Q12H  . piperacillin-tazobactam (ZOSYN)  IV  3.375 g Intravenous Q8H  . sodium chloride  3 mL Intravenous Q12H  . vancomycin  750 mg Intravenous Q12H   Continuous:  VPX:TGGYIR chloride, albuterol, ondansetron **OR** ondansetron (ZOFRAN) IV, oxyCODONE-acetaminophen, sodium chloride  Assesment:He has acute hypoxic hypercapnic respiratory failure. This is superimposed on his chronic respiratory failure. His chest x-ray shows that the left infiltrate is worse. At baseline he has COPD. He was treated for healthcare associated pneumonia last hospitalization but I don't think is really clear whether this is related to pneumonia or to his malignancy. He has a metastatic adenocarcinoma of unknown primary. He has started chemotherapy. His white blood count is quite elevated but he is on bone marrow stimulation after chemotherapy. He has severe protein calorie malnutrition at baseline.  Principal Problem:   Acute respiratory failure with hypoxia ([facility]) Active Problems:   Pleural effusion on right   COPD exacerbation ([facility])   Adenocarcinoma of unknown primary, CancerType ID showing 89% probability of Gastroesophageal Adenocarcinoma ([facility])   Protein-calorie malnutrition, severe   Metastatic cancer ([place])   Pressure ulcer    Plan:I agree with treatment for healthcare associated pneumonia. His infiltrate looks worse. I'm concerned that this may  be related to his metastatic adenocarcinoma rather than infection but I don't think we can tell that. I would continue with  efforts to keep him comfortable continue BiPAP if he needs it and he does not want intubation and mechanical ventilation which I think is appropriate. He has a complicated situation and requires high complexity decision making and needs intensive care unit care   LOS: 0 days   Avelino Herren L 06/21/2015, 9:16 AM

## 2015-06-18 NOTE — ED Notes (Signed)
Pt on home O2.  Woke up tonight and power was out.  Without O2 for unknown amount of time.  Increased SOB.  O2 Sat 64% on 2 L

## 2015-06-18 NOTE — Progress Notes (Signed)
Trying on CPAP 10 vs BiPAP appears to tolerate well.

## 2015-06-18 NOTE — ED Notes (Signed)
CRITICAL VALUE ALERT  Critical value received: ABG: PH 7.27; CO2 80.8; PO2 94.5; Bicarb 31.6; SAT 95.6%  Date of notification:  07/16/2015  Time of notification: 0203  Critical value read back:Yes.    Nurse who received alert:  Lucy Antigua, RN, BSN  MD notified (1st page):  Dr. Wyvonnia Dusky  Time of first page:  0203  Time MD responded:  0203

## 2015-06-19 ENCOUNTER — Inpatient Hospital Stay (HOSPITAL_COMMUNITY): Payer: Medicaid Other

## 2015-06-19 NOTE — Progress Notes (Signed)
Subjective: He says he feels a little better today. He did use BiPAP last night and he is still on high flow oxygen and still looks short of breath at rest. His equipment for draining his Pleurx catheter is at home and I have asked him to get someone to bring it up so that we can drain his fluid. We do not have the proper adapter on the unit at this time.  Objective: Vital signs in last 24 hours: Temp:  [96.2 F (35.7 C)-97.5 F (36.4 C)] 97.5 F (36.4 C) (12/04 0832) Pulse Rate:  [73-113] 73 (12/04 0500) Resp:  [10-23] 19 (12/04 0500) BP: (107-139)/(59-88) 139/78 mmHg (12/04 0500) SpO2:  [86 %-100 %] 92 % (12/04 0852) FiO2 (%):  [45 %] 45 % (12/04 0205) Weight:  [53.2 kg (117 lb 4.6 oz)] 53.2 kg (117 lb 4.6 oz) (12/04 0500) Weight change: -1.232 kg (-2 lb 11.4 oz) Last BM Date: 06/17/15  Intake/Output from previous day: 12/03 0701 - 12/04 0700 In: 450 [IV Piggyback:450] Out: 800 [Urine:800]  PHYSICAL EXAM General appearance: alert and moderate distress Resp: rhonchi bilaterally Cardio: regular rate and rhythm, S1, S2 normal, no murmur, click, rub or gallop GI: soft, non-tender; bowel sounds normal; no masses,  no organomegaly Extremities: extremities normal, atraumatic, no cyanosis or edema  Lab Results:  Results for orders placed or performed during the hospital encounter of 07/07/2015 (from the past 48 hour(s))  Blood gas, arterial ([facility] ONLY)     Status: Abnormal   Collection Time: 07/02/2015  1:55 AM  Result Value Ref Range   FIO2 1.00    Delivery systems NON-REBREATHER OXYGEN MASK    pH, Arterial 7.272 (L) 7.350 - 7.450   pCO2 arterial 80.8 (HH) 35.0 - 45.0 mmHg    Comment: CRITICAL RESULT CALLED TO, READ BACK BY AND VERIFIED WITH: S.YARBOROUGH,RN BY B.STOPHEL,RRT,RCP ON 06/17/2015 AT 2:01.    pO2, Arterial 94.5 80.0 - 100.0 mmHg   Bicarbonate 31.6 (H) 20.0 - 24.0 mEq/L   Acid-Base Excess 9.2 (H) 0.0 - 2.0 mmol/L   O2 Saturation 95.6 %   Patient temperature 37.0     Collection site RIGHT RADIAL    Drawn by [id number]    Sample type ARTERIAL DRAW    Allens test (pass/fail) PASS PASS  Basic metabolic panel     Status: Abnormal   Collection Time: 06/28/2015  2:10 AM  Result Value Ref Range   Sodium 132 (L) 135 - 145 mmol/L   Potassium 4.3 3.5 - 5.1 mmol/L   Chloride 87 (L) 101 - 111 mmol/L   CO2 34 (H) 22 - 32 mmol/L   Glucose, Bld 114 (H) 65 - 99 mg/dL   BUN 15 6 - 20 mg/dL   Creatinine, Ser 0.58 (L) 0.61 - 1.24 mg/dL   Calcium 9.4 8.9 - 10.3 mg/dL   GFR calc non Af Amer >60 >60 mL/min   GFR calc Af Amer >60 >60 mL/min    Comment: (NOTE) The eGFR has been calculated using the CKD EPI equation. This calculation has not been validated in all clinical situations. eGFR's persistently <60 mL/min signify possible Chronic Kidney Disease.    Anion gap 11 5 - 15  CBC     Status: Abnormal   Collection Time: 07/14/2015  2:10 AM  Result Value Ref Range   WBC 47.8 (H) 4.0 - 10.5 K/uL   RBC 3.28 (L) 4.22 - 5.81 MIL/uL   Hemoglobin 8.9 (L) 13.0 - 17.0 g/dL   HCT  28.3 (L) 39.0 - 52.0 %   MCV 86.3 78.0 - 100.0 fL   MCH 27.1 26.0 - 34.0 pg   MCHC 31.4 30.0 - 36.0 g/dL   RDW 17.8 (H) 11.5 - 15.5 %   Platelets 545 (H) 150 - 400 K/uL  Troponin I     Status: None   Collection Time: 06/21/2015  2:10 AM  Result Value Ref Range   Troponin I <0.03 <0.031 ng/mL    Comment:        NO INDICATION OF MYOCARDIAL INJURY.   Brain natriuretic peptide     Status: Abnormal   Collection Time: 07/07/2015  2:10 AM  Result Value Ref Range   B Natriuretic Peptide 310.0 (H) 0.0 - 100.0 pg/mL  Protime-INR     Status: Abnormal   Collection Time: 06/21/2015  2:10 AM  Result Value Ref Range   Prothrombin Time 15.5 (H) 11.6 - 15.2 seconds   INR 1.22 0.00 - 1.49  I-Stat CG4 Lactic Acid, ED     Status: None   Collection Time: 07/06/2015  3:00 AM  Result Value Ref Range   Lactic Acid, Venous 1.71 0.5 - 2.0 mmol/L  MRSA PCR Screening     Status: None   Collection Time: 06/27/2015  4:10 AM   Result Value Ref Range   MRSA by PCR NEGATIVE NEGATIVE    Comment:        The GeneXpert MRSA Assay (FDA approved for NASAL specimens only), is one component of a comprehensive MRSA colonization surveillance program. It is not intended to diagnose MRSA infection nor to guide or monitor treatment for MRSA infections.   Blood gas, arterial     Status: Abnormal   Collection Time: 06/17/2015  6:25 AM  Result Value Ref Range   FIO2 0.50    Delivery systems BILEVEL POSITIVE AIRWAY PRESSURE    Mode OXYGEN MASK    LHR 10 resp/min   Inspiratory PAP 12    Expiratory PAP 6.0    pH, Arterial 7.337 (L) 7.350 - 7.450   pCO2 arterial 70.8 (HH) 35.0 - 45.0 mmHg    Comment: CRITICAL RESULT CALLED TO, READ BACK BY AND VERIFIED WITH:  TO NELSON T RN AT 0630 07/05/2015 BY [facility] RRT    pO2, Arterial 84.1 80.0 - 100.0 mmHg   Bicarbonate 33.5 (H) 20.0 - 24.0 mEq/L   Acid-Base Excess 10.8 (H) 0.0 - 2.0 mmol/L   O2 Saturation 95.0 %   Collection site RADIAL    Drawn by [id number]    Sample type ARTERIAL    Allens test (pass/fail) PASS PASS    ABGS  Recent Labs  07/01/2015 0625  PHART 7.337*  PO2ART 84.1  HCO3 33.5*   CULTURES Recent Results (from the past 240 hour(s))  MRSA PCR Screening     Status: None   Collection Time: 07/12/2015  4:10 AM  Result Value Ref Range Status   MRSA by PCR NEGATIVE NEGATIVE Final    Comment:        The GeneXpert MRSA Assay (FDA approved for NASAL specimens only), is one component of a comprehensive MRSA colonization surveillance program. It is not intended to diagnose MRSA infection nor to guide or monitor treatment for MRSA infections.    Studies/Results: Dg Chest Portable 1 View  07/06/2015  CLINICAL DATA:  Dyspnea. EXAM: PORTABLE CHEST 1 VIEW COMPARISON:  06/06/2015 FINDINGS: Right subclavian Port-A-Cath appears unchanged in position. Near complete opacification of the right hemi thorax persists without significant change. Severe emphysematous  changes  in the left apex. There is worsening consolidation in the left midlung and left base. IMPRESSION: Worsening consolidation in the left midlung and left base. Unchanged right hemi thorax opacification. Electronically Signed   By: Andreas Newport M.D.   On: 07/01/2015 02:10    Medications:  Prior to Admission:  Prescriptions prior to admission  Medication Sig Dispense Refill Last Dose  . albuterol (PROVENTIL HFA;VENTOLIN HFA) 108 (90 BASE) MCG/ACT inhaler Inhale 2 puffs into the lungs every 6 (six) hours as needed for wheezing or shortness of breath. 1 Inhaler 2 06/17/2015 at Unknown time  . amoxicillin-clavulanate (AUGMENTIN) 500-125 MG tablet Take 1 tablet (500 mg total) by mouth 3 (three) times daily. 30 tablet 0 06/17/2015 at Unknown time  . CARBOPLATIN IV Inject into the vein. Every 21 days   06/03/2015  . dexamethasone (DECADRON) 4 MG tablet The day before chemo take 5 tabs in the am and 5 tabs in the pm. The morning of chemo take 5 tabs prior to chemo appt. 45 tablet 1 06/17/2015 at Unknown time  . fluconazole (DIFLUCAN) 100 MG tablet Take 1 tablet (100 mg total) by mouth daily. 14 tablet 0 06/17/2015 at Unknown time  . Fluticasone-Salmeterol (ADVAIR DISKUS) 250-50 MCG/DOSE AEPB Inhale 1 puff into the lungs 2 (two) times daily. 60 each 12 06/17/2015 at Unknown time  . guaiFENesin (MUCINEX) 600 MG 12 hr tablet Take 1 tablet (600 mg total) by mouth 2 (two) times daily as needed. 30 tablet 2 06/17/2015 at Unknown time  . lidocaine-prilocaine (EMLA) cream Apply a quarter size amount to port site 1 hour prior to chemo. Do not rub in. Cover with plastic wrap. 30 g 2 Taking  . megestrol (MEGACE) 400 MG/10ML suspension Take 10 mLs (400 mg total) by mouth 2 (two) times daily. 300 mL 1 06/17/2015 at Unknown time  . morphine (MS CONTIN) 15 MG 12 hr tablet Take 1 tablet (15 mg total) by mouth every 12 (twelve) hours. 60 tablet 0 06/17/2015 at Unknown time  . ondansetron (ZOFRAN) 8 MG tablet Take 1 tablet (8 mg  total) by mouth every 8 (eight) hours as needed for nausea or vomiting. 30 tablet 2 06/17/2015 at Unknown time  . oxyCODONE-acetaminophen (PERCOCET/ROXICET) 5-325 MG tablet Take 1-2 tablets by mouth every 4 (four) hours as needed for moderate pain or severe pain. 100 tablet 0 06/17/2015 at Unknown time  . temazepam (RESTORIL) 15 MG capsule Take 1 capsule (15 mg total) by mouth at bedtime as needed for sleep. 30 capsule 1 06/17/2015 at Unknown time  . predniSONE (STERAPRED UNI-PAK 21 TAB) 10 MG (21) TBPK tablet Take 1 tablet (10 mg total) by mouth daily. 4 tab po daily for b3 days, then 3 tab po daily for 3 days, 2 tab po daily for 3 days and 1 tab po daily for 3 days, (Patient not taking: Reported on 07/12/2015) 30 tablet 0 Completed Course at Unknown time   Scheduled: . enoxaparin (LOVENOX) injection  40 mg Subcutaneous Q24H  . feeding supplement (ENSURE ENLIVE)  237 mL Oral BID BM  . ipratropium-albuterol  3 mL Nebulization Q6H  . methylPREDNISolone (SOLU-MEDROL) injection  80 mg Intravenous Q12H  . morphine  15 mg Oral Q12H  . piperacillin-tazobactam (ZOSYN)  IV  3.375 g Intravenous Q8H  . sodium chloride  3 mL Intravenous Q12H  . vancomycin  750 mg Intravenous Q12H   Continuous:  UDJ:SHFWYO chloride, albuterol, ondansetron **OR** ondansetron (ZOFRAN) IV, oxyCODONE-acetaminophen, sodium chloride  Assesment: He was  admitted with acute hypoxic respiratory failure. This is acute on chronic. He has required BiPAP here. He does not want to be intubated which I think is appropriate. He has metastatic cancer of unknown primary at baseline and is receiving chemotherapy. He has healthcare associated pneumonia versus worsening of his adenocarcinoma. He has a large right pleural effusion and has a Pleurx catheter and we will drain the fluid if his family brings up the equipment. He has protein calorie malnutrition which is stable. He has severe COPD at baseline. 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 Obtain the equipment and drain his pleural effusion. If his family does not bring up the equipment and osseous we can get it from [facility]. Continue IV antibiotics steroids and BiPAP as needed    LOS: 1 day   Admire Bunnell L 06/19/2015, 9:47 AM

## 2015-06-19 NOTE — Progress Notes (Signed)
Pt does not understand the severity of his illness.  Has mentioned multiple times that he plans on going home and eventually not needing oxygen.  May benefit from a palliative care consult.

## 2015-06-19 NOTE — Progress Notes (Signed)
Subjective: Patient fels slightly better today. He is on high flow oxygen and BIPAP during the night.  Objective: Vital signs in last 24 hours: Temp:  [96.2 F (35.7 C)-97.5 F (36.4 C)] 97.5 F (36.4 C) (12/04 0832) Pulse Rate:  [73-113] 73 (12/04 0500) Resp:  [10-23] 19 (12/04 0500) BP: (107-139)/(59-88) 139/78 mmHg (12/04 0500) SpO2:  [86 %-100 %] 92 % (12/04 0852) FiO2 (%):  [45 %] 45 % (12/04 0205) Weight:  [53.2 kg (117 lb 4.6 oz)] 53.2 kg (117 lb 4.6 oz) (12/04 0500) Weight change: -1.232 kg (-2 lb 11.4 oz) Last BM Date: 06/17/15  Intake/Output from previous day: 12/03 0701 - 12/04 0700 In: 450 [IV Piggyback:450] Out: 800 [Urine:800]  PHYSICAL EXAM General appearance: cachectic, moderate distress and slowed mentation Resp: diminished breath sounds bilaterally and rhonchi bilaterally Cardio: S1, S2 normal GI: soft, non-tender; bowel sounds normal; no masses,  no organomegaly Extremities: extremities normal, atraumatic, no cyanosis or edema  Lab Results:  Results for orders placed or performed during the hospital encounter of 06/19/2015 (from the past 48 hour(s))  Blood gas, arterial ([facility] ONLY)     Status: Abnormal   Collection Time: 07/09/2015  1:55 AM  Result Value Ref Range   FIO2 1.00    Delivery systems NON-REBREATHER OXYGEN MASK    pH, Arterial 7.272 (L) 7.350 - 7.450   pCO2 arterial 80.8 (HH) 35.0 - 45.0 mmHg    Comment: CRITICAL RESULT CALLED TO, READ BACK BY AND VERIFIED WITH: S.YARBOROUGH,RN BY B.STOPHEL,RRT,RCP ON 07/10/2015 AT 2:01.    pO2, Arterial 94.5 80.0 - 100.0 mmHg   Bicarbonate 31.6 (H) 20.0 - 24.0 mEq/L   Acid-Base Excess 9.2 (H) 0.0 - 2.0 mmol/L   O2 Saturation 95.6 %   Patient temperature 37.0    Collection site RIGHT RADIAL    Drawn by [id number]    Sample type ARTERIAL DRAW    Allens test (pass/fail) PASS PASS  Basic metabolic panel     Status: Abnormal   Collection Time: 07/13/2015  2:10 AM  Result Value Ref Range   Sodium 132 (L) 135 - 145  mmol/L   Potassium 4.3 3.5 - 5.1 mmol/L   Chloride 87 (L) 101 - 111 mmol/L   CO2 34 (H) 22 - 32 mmol/L   Glucose, Bld 114 (H) 65 - 99 mg/dL   BUN 15 6 - 20 mg/dL   Creatinine, Ser 0.62 (L) 0.61 - 1.24 mg/dL   Calcium 9.4 8.9 - 37.6 mg/dL   GFR calc non Af Amer >60 >60 mL/min   GFR calc Af Amer >60 >60 mL/min    Comment: (NOTE) The eGFR has been calculated using the CKD EPI equation. This calculation has not been validated in all clinical situations. eGFR's persistently <60 mL/min signify possible Chronic Kidney Disease.    Anion gap 11 5 - 15  CBC     Status: Abnormal   Collection Time: 06/17/2015  2:10 AM  Result Value Ref Range   WBC 47.8 (H) 4.0 - 10.5 K/uL   RBC 3.28 (L) 4.22 - 5.81 MIL/uL   Hemoglobin 8.9 (L) 13.0 - 17.0 g/dL   HCT 28.3 (L) 15.1 - 76.1 %   MCV 86.3 78.0 - 100.0 fL   MCH 27.1 26.0 - 34.0 pg   MCHC 31.4 30.0 - 36.0 g/dL   RDW 60.7 (H) 37.1 - 06.2 %   Platelets 545 (H) 150 - 400 K/uL  Troponin I     Status: None  Collection Time: 06/24/2015  2:10 AM  Result Value Ref Range   Troponin I <0.03 <0.031 ng/mL    Comment:        NO INDICATION OF MYOCARDIAL INJURY.   Brain natriuretic peptide     Status: Abnormal   Collection Time: 07/09/2015  2:10 AM  Result Value Ref Range   B Natriuretic Peptide 310.0 (H) 0.0 - 100.0 pg/mL  Protime-INR     Status: Abnormal   Collection Time: 07/07/2015  2:10 AM  Result Value Ref Range   Prothrombin Time 15.5 (H) 11.6 - 15.2 seconds   INR 1.22 0.00 - 1.49  I-Stat CG4 Lactic Acid, ED     Status: None   Collection Time: 07/05/2015  3:00 AM  Result Value Ref Range   Lactic Acid, Venous 1.71 0.5 - 2.0 mmol/L  MRSA PCR Screening     Status: None   Collection Time: 07/01/2015  4:10 AM  Result Value Ref Range   MRSA by PCR NEGATIVE NEGATIVE    Comment:        The GeneXpert MRSA Assay (FDA approved for NASAL specimens only), is one component of a comprehensive MRSA colonization surveillance program. It is not intended to  diagnose MRSA infection nor to guide or monitor treatment for MRSA infections.   Blood gas, arterial     Status: Abnormal   Collection Time: 07/08/2015  6:25 AM  Result Value Ref Range   FIO2 0.50    Delivery systems BILEVEL POSITIVE AIRWAY PRESSURE    Mode OXYGEN MASK    LHR 10 resp/min   Inspiratory PAP 12    Expiratory PAP 6.0    pH, Arterial 7.337 (L) 7.350 - 7.450   pCO2 arterial 70.8 (HH) 35.0 - 45.0 mmHg    Comment: CRITICAL RESULT CALLED TO, READ BACK BY AND VERIFIED WITH:  TO NELSON T RN AT 0630 06/25/2015 BY [facility] RRT    pO2, Arterial 84.1 80.0 - 100.0 mmHg   Bicarbonate 33.5 (H) 20.0 - 24.0 mEq/L   Acid-Base Excess 10.8 (H) 0.0 - 2.0 mmol/L   O2 Saturation 95.0 %   Collection site RADIAL    Drawn by [id number]    Sample type ARTERIAL    Allens test (pass/fail) PASS PASS    ABGS  Recent Labs  07/12/2015 0625  PHART 7.337*  PO2ART 84.1  HCO3 33.5*   CULTURES Recent Results (from the past 240 hour(s))  MRSA PCR Screening     Status: None   Collection Time: 06/25/2015  4:10 AM  Result Value Ref Range Status   MRSA by PCR NEGATIVE NEGATIVE Final    Comment:        The GeneXpert MRSA Assay (FDA approved for NASAL specimens only), is one component of a comprehensive MRSA colonization surveillance program. It is not intended to diagnose MRSA infection nor to guide or monitor treatment for MRSA infections.    Studies/Results: Dg Chest Portable 1 View  07/10/2015  CLINICAL DATA:  Dyspnea. EXAM: PORTABLE CHEST 1 VIEW COMPARISON:  06/06/2015 FINDINGS: Right subclavian Port-A-Cath appears unchanged in position. Near complete opacification of the right hemi thorax persists without significant change. Severe emphysematous changes in the left apex. There is worsening consolidation in the left midlung and left base. IMPRESSION: Worsening consolidation in the left midlung and left base. Unchanged right hemi thorax opacification. Electronically Signed   By: Andreas Newport  M.D.   On: 06/30/2015 02:10    Medications: I have reviewed the patient's current medications.  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

## 2015-06-19 NOTE — Progress Notes (Signed)
FAMILY CONCERNED THAT O2 TANKS AT HOME ARE NOT FUNCTIONING PROPERLY. FAMILY WOULD LIKE FOR CASE MANAGEMENT TO LOOK INTO THIS SITUATION.

## 2015-06-20 DIAGNOSIS — C801 Malignant (primary) neoplasm, unspecified: Secondary | ICD-10-CM

## 2015-06-20 DIAGNOSIS — J948 Other specified pleural conditions: Secondary | ICD-10-CM

## 2015-06-20 DIAGNOSIS — E43 Unspecified severe protein-calorie malnutrition: Secondary | ICD-10-CM

## 2015-06-20 DIAGNOSIS — L8990 Pressure ulcer of unspecified site, unspecified stage: Secondary | ICD-10-CM

## 2015-06-20 DIAGNOSIS — Z515 Encounter for palliative care: Secondary | ICD-10-CM

## 2015-06-20 DIAGNOSIS — J9601 Acute respiratory failure with hypoxia: Secondary | ICD-10-CM

## 2015-06-20 MED ORDER — METHYLPREDNISOLONE SODIUM SUCC 40 MG IJ SOLR
INTRAMUSCULAR | Status: AC
  Administered 2015-06-20: 80 mg
  Filled 2015-06-20: qty 2

## 2015-06-20 NOTE — Progress Notes (Signed)
Subjective: He says he feels about the same. He has no new complaints. He is still short of breath. No one brought his equipment from home yesterday so I'm going to call his mother this morning to see if she can bring up the equipment so we can drain his Pleurx catheter.  Objective: Vital signs in last 24 hours: Temp:  [96.6 F (35.9 C)-97.5 F (36.4 C)] 97 F (36.1 C) (12/05 0400) Pulse Rate:  [75-107] 84 (12/05 0600) Resp:  [12-24] 14 (12/05 0600) BP: (111-155)/(56-87) 131/87 mmHg (12/05 0600) SpO2:  [89 %-100 %] 100 % (12/05 0600) FiO2 (%):  [70 %] 70 % (12/05 0220) Weight:  [53.5 kg (117 lb 15.1 oz)] 53.5 kg (117 lb 15.1 oz) (12/05 0500) Weight change: 0.3 kg (10.6 oz) Last BM Date: 06/17/15  Intake/Output from previous day: 12/04 0701 - 12/05 0700 In: 1040 [P.O.:840; IV Piggyback:200] Out: 900 [Urine:900]  PHYSICAL EXAM General appearance: alert and moderate distress Resp: Diminished breath sounds on the right rales and rhonchi on the left Cardio: regular rate and rhythm, S1, S2 normal, no murmur, click, rub or gallop GI: soft, non-tender; bowel sounds normal; no masses,  no organomegaly Extremities: extremities normal, atraumatic, no cyanosis or edema  Lab Results:  No results found for this or any previous visit (from the past 48 hour(s)).  ABGS  Recent Labs  07/01/2015 0625  PHART 7.337*  PO2ART 84.1  HCO3 33.5*   CULTURES Recent Results (from the past 240 hour(s))  MRSA PCR Screening     Status: None   Collection Time: 07/11/2015  4:10 AM  Result Value Ref Range Status   MRSA by PCR NEGATIVE NEGATIVE Final    Comment:        The GeneXpert MRSA Assay (FDA approved for NASAL specimens only), is one component of a comprehensive MRSA colonization surveillance program. It is not intended to diagnose MRSA infection nor to guide or monitor treatment for MRSA infections.    Studies/Results: Dg Chest Port 1 View  06/19/2015  CLINICAL DATA:  Respiratory  failure.  Metastatic adenocarcinoma. EXAM: PORTABLE CHEST 1 VIEW COMPARISON:  Radiographs 06/06/2015 and 06/19/2015.  CT 06/01/2015. FINDINGS: 1023 hours. Right subclavian Port-A-Cath appears unchanged at the SVC right atrial level. There is stable near complete opacification of the right hemithorax with volume loss, complex pleural fluid and underlying pulmonary opacity. Airspace opacities throughout the left lung are stable, sparing the apex which is nearly completely replaced by a large bulla. The visualized mediastinal contours are stable. IMPRESSION: No significant change in the radiographic appearance of the chest. Persistent bilateral airspace opacities with near complete opacification of the right hemithorax. Electronically Signed   By: Richardean Sale M.D.   On: 06/19/2015 11:06    Medications:  Prior to Admission:  Prescriptions prior to admission  Medication Sig Dispense Refill Last Dose  . albuterol (PROVENTIL HFA;VENTOLIN HFA) 108 (90 BASE) MCG/ACT inhaler Inhale 2 puffs into the lungs every 6 (six) hours as needed for wheezing or shortness of breath. 1 Inhaler 2 06/17/2015 at Unknown time  . amoxicillin-clavulanate (AUGMENTIN) 500-125 MG tablet Take 1 tablet (500 mg total) by mouth 3 (three) times daily. 30 tablet 0 06/17/2015 at Unknown time  . CARBOPLATIN IV Inject into the vein. Every 21 days   06/03/2015  . dexamethasone (DECADRON) 4 MG tablet The day before chemo take 5 tabs in the am and 5 tabs in the pm. The morning of chemo take 5 tabs prior to chemo appt. 45 tablet  1 06/17/2015 at Unknown time  . fluconazole (DIFLUCAN) 100 MG tablet Take 1 tablet (100 mg total) by mouth daily. 14 tablet 0 06/17/2015 at Unknown time  . Fluticasone-Salmeterol (ADVAIR DISKUS) 250-50 MCG/DOSE AEPB Inhale 1 puff into the lungs 2 (two) times daily. 60 each 12 06/17/2015 at Unknown time  . guaiFENesin (MUCINEX) 600 MG 12 hr tablet Take 1 tablet (600 mg total) by mouth 2 (two) times daily as needed. 30 tablet  2 06/17/2015 at Unknown time  . lidocaine-prilocaine (EMLA) cream Apply a quarter size amount to port site 1 hour prior to chemo. Do not rub in. Cover with plastic wrap. 30 g 2 Taking  . megestrol (MEGACE) 400 MG/10ML suspension Take 10 mLs (400 mg total) by mouth 2 (two) times daily. 300 mL 1 06/17/2015 at Unknown time  . morphine (MS CONTIN) 15 MG 12 hr tablet Take 1 tablet (15 mg total) by mouth every 12 (twelve) hours. 60 tablet 0 06/17/2015 at Unknown time  . ondansetron (ZOFRAN) 8 MG tablet Take 1 tablet (8 mg total) by mouth every 8 (eight) hours as needed for nausea or vomiting. 30 tablet 2 06/17/2015 at Unknown time  . oxyCODONE-acetaminophen (PERCOCET/ROXICET) 5-325 MG tablet Take 1-2 tablets by mouth every 4 (four) hours as needed for moderate pain or severe pain. 100 tablet 0 06/17/2015 at Unknown time  . temazepam (RESTORIL) 15 MG capsule Take 1 capsule (15 mg total) by mouth at bedtime as needed for sleep. 30 capsule 1 06/17/2015 at Unknown time  . predniSONE (STERAPRED UNI-PAK 21 TAB) 10 MG (21) TBPK tablet Take 1 tablet (10 mg total) by mouth daily. 4 tab po daily for b3 days, then 3 tab po daily for 3 days, 2 tab po daily for 3 days and 1 tab po daily for 3 days, (Patient not taking: Reported on 07/07/2015) 30 tablet 0 Completed Course at Unknown time   Scheduled: . enoxaparin (LOVENOX) injection  40 mg Subcutaneous Q24H  . feeding supplement (ENSURE ENLIVE)  237 mL Oral BID BM  . ipratropium-albuterol  3 mL Nebulization Q6H  . methylPREDNISolone (SOLU-MEDROL) injection  80 mg Intravenous Q12H  . morphine  15 mg Oral Q12H  . piperacillin-tazobactam (ZOSYN)  IV  3.375 g Intravenous Q8H  . sodium chloride  3 mL Intravenous Q12H  . vancomycin  750 mg Intravenous Q12H   Continuous:  HWE:[id number] chloride, albuterol, ondansetron **OR** ondansetron (ZOFRAN) IV, oxyCODONE-acetaminophen, sodium chloride  Assesment: He was admitted with acute hypoxic respiratory failure. He has healthcare  associated pneumonia versus metastatic adenocarcinoma of unknown primary. He has protein calorie malnutrition which has been severe. He has a large right pleural effusion and has a Pleurx catheter. I will request his family bring the equipment up so that we can drain patent. At baseline he has severe COPD and has recently stopped smoking. He has a high complexity problem requiring complex decision-making. 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 Continue other medications.    LOS: 2 days   Ameriah Lint L 06/20/2015, 7:51 AM

## 2015-06-20 NOTE — Progress Notes (Signed)
Daily Progress Note   Patient Name: Brian Nelson       Date: 06/20/2015 DOB: [birth year]  Age: 54 y.o. MRN#: [id number] Attending Physician: Rosita Fire, MD Primary Care Physician: Rosita Fire, MD Admit Date: 06/26/2015  Reason for Consultation/Follow-up: Establishing goals of care and Psychosocial/spiritual support  Subjective: Brian Nelson is resting in a dark room.  He tells me that he came to the hospital because his power went out.  He later states his oxygen wasn't working.  He denies pain, or dyspnea at this time.  We talk about his cancer treatments, and I ask when his next session will be, he asks me "it's not doing any good?".  I share that I don't know, I just want to know when his next treatment will be.  He is confused today, and keeps asking "What does that mean?".  He agrees to have me talk with his mother, Brian Nelson.   Brian Nelson tells me that she is on the way to the hospital and will meet with me.  I share my worry about his care at home and she tells me, "keep him there".  We briefly discuss SNF and Brian Nelson tells me that she would be ok with having him go to rehab.  We will talk more face to face.  Meeting with mother who asks questions.  She tells me that she is angry with Brian Nelson about going around and telling people that Brian Nelson has cancer and is dying.  We talk about his cancer and that there is no cure.  Brian Nelson has low health literacy and will need to have continued education.   Length of Stay: 2 days  Current Medications: Scheduled Meds:  . enoxaparin (LOVENOX) injection  40 mg Subcutaneous Q24H  . feeding supplement (ENSURE ENLIVE)  237 mL Oral BID BM  . ipratropium-albuterol  3 mL Nebulization Q6H  . methylPREDNISolone (SOLU-MEDROL) injection  80 mg Intravenous Q12H  . morphine  15 mg Oral Q12H  . piperacillin-tazobactam (ZOSYN)  IV  3.375 g Intravenous Q8H  . sodium chloride  3 mL Intravenous Q12H  . vancomycin  750 mg Intravenous Q12H    Continuous  Infusions:    PRN Meds: sodium chloride, albuterol, ondansetron **OR** ondansetron (ZOFRAN) IV, oxyCODONE-acetaminophen, sodium chloride  Palliative Performance Scale: 40% at best     Vital Signs: BP 106/71 mmHg  Pulse 79  Temp(Src) 97 F (36.1 C) (Oral)  Resp 20  Ht '5\' 6"'$  (1.676 m)  Wt 53.5 kg (117 lb 15.1 oz)  BMI 19.05 kg/m2  SpO2 93% SpO2: SpO2: 93 % O2 Device: O2 Device: High Flow Nasal Cannula O2 Flow Rate: O2 Flow Rate (L/min): 15 L/min  Intake/output summary:  Intake/Output Summary (Last 24 hours) at 06/20/15 1149 Last data filed at 06/20/15 1000  Gross per 24 hour  Intake    560 ml  Output    700 ml  Net   -140 ml   LBM:   Baseline Weight: Weight: 54.432 kg (120 lb) Most recent weight: Weight: 53.5 kg (117 lb 15.1 oz)  Physical Exam: Constitutional: Ill appearing, frail, looks older than age. He is oriented to person, place, and time. Thin, in no distress.   HENT:  Head: Normocephalic and atraumatic. temporal wasting.  Cardiovascular: RRR and normal heart sounds.  Exam reveals no gallop and no friction rub.   Pulmonary/Chest:  Even, diminished on right. no wheezes.   Abdominal: Soft, no tenderness/guarding.  Musculoskeletal:  He exhibits no edema.   Skin: Skin  is warm and dry. Nursing reports possible bed bugs, but no rash.  Psychiatric: Lethargic, asking questions repeatedly.  Nursing note and vitals reviewed.             Additional Data Reviewed: Recent Labs     06/16/2015  0210  WBC  47.8*  HGB  8.9*  PLT  545*  NA  132*  BUN  15  CREATININE  0.58*     Problem List:  Patient Active Problem List   Diagnosis Date Noted  . Acute respiratory failure ([place]) 06/28/2015  . Pressure ulcer 06/26/2015  . Metastatic cancer ([place])   . Pleural effusion, left   . Palliative care encounter   . DNR (do not resuscitate) discussion   . COPD (chronic obstructive pulmonary disease) ([place]) 06/02/2015  . HCAP (healthcare-associated pneumonia) 06/02/2015  .  Protein-calorie malnutrition, severe 06/02/2015  . Acute respiratory failure with hypoxia ([place]) 06/02/2015  . Dyspnea 06/01/2015  . Anemia 05/19/2015  . 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 Recurrent pleural effusion on right 05/08/2015  . Pleural effusion 05/08/2015  . ETOH abuse   . Drug abuse   . Shortness of breath 05/03/2015  . Cancer ([place])   . Cavitating mass in right upper lung lobe   . Leukocytosis 04/24/2015  . S/P thoracentesis   . Malnutrition of moderate degree 04/23/2015  . COPD exacerbation ([place]) 04/23/2015  . CAP (community acquired pneumonia) 04/23/2015  . Tobacco use disorder 04/23/2015  . Alcohol dependence ([place]) 04/23/2015  . Pleural effusion on right 04/22/2015     Palliative Care Assessment & Plan    Code Status:  DNR  Goals of Care:  Return to home with [facility] services  Symptom Management:  MS contin SR 15 mg Q 12 hours  Zofran 4 mg PO/IV Q 6 hours PRN  Percocet 5/325 mg 1-2 tabs, Q 4 hours PRN.   Palliative Prophylaxis:  Adding Senna S Q HS.   Psycho-social/Spiritual:  Desire for further Chaplaincy support:no   Prognosis: Unable to determine, based on response to chemo Discharge Planning: Home with [place] was discussed with nursing staff, CM, SW, and will share with Dr. Legrand Rams on next rounds.   Thank you for allowing the Palliative Medicine Team to assist in the care of this patient.   Time In: 1045 Time Out: 1105 Total Time 20 minutes Prolonged Time Billed  no     Greater than 50%  of this time was spent counseling and coordinating care related to the above assessment and plan.   Drue Novel, NP  06/20/2015, 11:49 AM  Please contact Palliative Medicine Team phone at [phone] for questions and concerns.

## 2015-06-20 NOTE — Progress Notes (Signed)
Left lateral pleural tube connected to PleurX drainage system 37m of reddish pinkish noted. Tolerated well dressing applied over exit site of tube.

## 2015-06-20 NOTE — Progress Notes (Signed)
Subjective: Patient is alert and awake but still short of breath. He remained high flow oxygen. No fever or chills.  Objective: Vital signs in last 24 hours: Temp:  [96.6 F (35.9 C)-97.5 F (36.4 C)] 97 F (36.1 C) (12/05 0400) Pulse Rate:  [75-107] 84 (12/05 0600) Resp:  [12-24] 14 (12/05 0600) BP: (111-155)/(56-87) 131/87 mmHg (12/05 0600) SpO2:  [89 %-100 %] 100 % (12/05 0600) FiO2 (%):  [70 %] 70 % (12/05 0220) Weight:  [53.5 kg (117 lb 15.1 oz)] 53.5 kg (117 lb 15.1 oz) (12/05 0500) Weight change: 0.3 kg (10.6 oz) Last BM Date: 06/17/15  Intake/Output from previous day: 12/04 0701 - 12/05 0700 In: 1040 [P.O.:840; IV Piggyback:200] Out: 900 [Urine:900]  PHYSICAL EXAM General appearance: cachectic, moderate distress and slowed mentation Resp: diminished breath sounds bilaterally and rhonchi bilaterally Cardio: S1, S2 normal GI: soft, non-tender; bowel sounds normal; no masses,  no organomegaly Extremities: extremities normal, atraumatic, no cyanosis or edema  Lab Results:  No results found for this or any previous visit (from the past 48 hour(s)).  ABGS  Recent Labs  06/25/2015 0625  PHART 7.337*  PO2ART 84.1  HCO3 33.5*   CULTURES Recent Results (from the past 240 hour(s))  MRSA PCR Screening     Status: None   Collection Time: 07/15/2015  4:10 AM  Result Value Ref Range Status   MRSA by PCR NEGATIVE NEGATIVE Final    Comment:        The GeneXpert MRSA Assay (FDA approved for NASAL specimens only), is one component of a comprehensive MRSA colonization surveillance program. It is not intended to diagnose MRSA infection nor to guide or monitor treatment for MRSA infections.    Studies/Results: Dg Chest Port 1 View  06/19/2015  CLINICAL DATA:  Respiratory failure.  Metastatic adenocarcinoma. EXAM: PORTABLE CHEST 1 VIEW COMPARISON:  Radiographs 06/06/2015 and 07/15/2015.  CT 06/01/2015. FINDINGS: 1023 hours. Right subclavian Port-A-Cath appears unchanged at  the SVC right atrial level. There is stable near complete opacification of the right hemithorax with volume loss, complex pleural fluid and underlying pulmonary opacity. Airspace opacities throughout the left lung are stable, sparing the apex which is nearly completely replaced by a large bulla. The visualized mediastinal contours are stable. IMPRESSION: No significant change in the radiographic appearance of the chest. Persistent bilateral airspace opacities with near complete opacification of the right hemithorax. Electronically Signed   By: Richardean Sale M.D.   On: 06/19/2015 11:06    Medications: I have reviewed the patient's current medications.  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

## 2015-06-20 NOTE — Care Management Note (Signed)
Case Management Note  Patient Details  Name: Brian Nelson MRN: [id number] Date of Birth: 1961-06-20  Subjective/Objective:                  Pt admitted for resp failure. Pt is from home, lives with his mother and has stage 4 cancer. Pt is active with [facility] for nursing services. Pt has home O2 through [facility], Pt has pleurx catheter in place prior to admission. Pt currently with AMS and on BIPAP.   Action/Plan: Palliative consult has been made by attending. Will cont to follow for DC planning pending pt progress.   Expected Discharge Date:  06/21/15               Expected Discharge Plan:  [place]  In-House Referral:  Hospice / Palliative Care  Discharge planning Services  CM Consult  Post Acute Care Choice:  Resumption of Svcs/PTA Provider Choice offered to:  Patient  DME Arranged:    DME Agency:     HH Arranged:  RN [place] Agency:  [place]  Status of Service:  In process, will continue to follow  Medicare Important Message Given:    Date Medicare IM Given:    Medicare IM give by:    Date Additional Medicare IM Given:    Additional Medicare Important Message give by:     If discussed at [place] of Stay Meetings, dates discussed:    Additional Comments:  Sherald Barge, RN 06/20/2015, 3:33 PM

## 2015-06-21 ENCOUNTER — Inpatient Hospital Stay (HOSPITAL_COMMUNITY): Payer: Medicaid Other

## 2015-06-21 LAB — CBC
HEMATOCRIT: 29.7 % — AB (ref 39.0–52.0)
HEMOGLOBIN: 8.6 g/dL — AB (ref 13.0–17.0)
MCH: 26.8 pg (ref 26.0–34.0)
MCHC: 29 g/dL — AB (ref 30.0–36.0)
MCV: 92.5 fL (ref 78.0–100.0)
Platelets: 648 10*3/uL — ABNORMAL HIGH (ref 150–400)
RBC: 3.21 MIL/uL — ABNORMAL LOW (ref 4.22–5.81)
RDW: 18.8 % — AB (ref 11.5–15.5)
WBC: 32 10*3/uL — AB (ref 4.0–10.5)

## 2015-06-21 LAB — BASIC METABOLIC PANEL
ANION GAP: 6 (ref 5–15)
BUN: 21 mg/dL — ABNORMAL HIGH (ref 6–20)
CHLORIDE: 85 mmol/L — AB (ref 101–111)
CO2: 47 mmol/L — ABNORMAL HIGH (ref 22–32)
Calcium: 9.7 mg/dL (ref 8.9–10.3)
Creatinine, Ser: 0.48 mg/dL — ABNORMAL LOW (ref 0.61–1.24)
GFR calc non Af Amer: >60 mL/min (ref >60)
Glucose, Bld: 97 mg/dL (ref 65–99)
POTASSIUM: 4.8 mmol/L (ref 3.5–5.1)
SODIUM: 138 mmol/L (ref 135–145)

## 2015-06-21 LAB — VANCOMYCIN, TROUGH: Vancomycin Tr: 10 ug/mL (ref 10.0–20.0)

## 2015-06-21 MED ORDER — LORAZEPAM 2 MG/ML IJ SOLN: 0.5000 mg | INTRAMUSCULAR | Status: DC | PRN

## 2015-06-21 MED ORDER — IOHEXOL 300 MG/ML  SOLN
75.0000 mL | Freq: Once | INTRAMUSCULAR | Status: AC | PRN
  Administered 2015-06-21: 75 mL via INTRAVENOUS

## 2015-06-21 MED ORDER — VANCOMYCIN HCL IN DEXTROSE 750-5 MG/150ML-% IV SOLN
750.0000 mg | Freq: Three times a day (TID) | INTRAVENOUS | Status: DC
  Administered 2015-06-21 (×2): 750 mg via INTRAVENOUS
  Filled 2015-06-21 (×4): qty 150

## 2015-06-21 NOTE — Progress Notes (Signed)
Attempt to change Bipap to High flow oxygen via nasal to eat. Patient oxygen level decreased. Bipap applied. Sips of [place]. Family at bedside.

## 2015-06-21 NOTE — Plan of Care (Signed)
Problem: ICU Phase Progression Outcomes Goal: O2 sats trending toward baseline Outcome: Not Progressing Patient did not tolerate oxygen via nasal cannula high flow rate. Goal: Dyspnea controlled at rest Outcome: Not Progressing Bipap in use. Goal: Hemodynamically stable Outcome: Not Progressing Not able to remove Bipap

## 2015-06-21 NOTE — Progress Notes (Signed)
ANTIBIOTIC CONSULT NOTE - INITIAL  Pharmacy Consult for Vancomycin and Zosyn Indication: pneumonia  No Known Allergies  Patient Measurements: Height: '5\' 6"'$  (167.6 cm) Weight: 117 lb 8.1 oz (53.3 kg) IBW/kg (Calculated) : 63.8  Vital Signs: Temp: 97.6 F (36.4 C) (12/06 0500) Temp Source: Axillary (12/06 0500) BP: 119/62 mmHg (12/06 0900) Pulse Rate: 81 (12/06 0900) Intake/Output from previous day: 12/05 0701 - 12/06 0700 In: 755.5 [P.O.:340; I.V.:115.5; IV Piggyback:300] Out: 1050 [Urine:1000; Chest Tube:50] Intake/Output from this shift: Total I/O In: -  Out: 100 [Chest Tube:100]  Labs:  Recent Labs  06/21/15 0434  WBC 32.0*  HGB 8.6*  PLT 648*  CREATININE 0.48*   Estimated Creatinine Clearance: 81.4 mL/min (by C-G formula based on Cr of 0.48).  Recent Labs  06/21/15 0434  [facility] 10    Microbiology: Recent Results (from the past 720 hour(s))  MRSA PCR Screening     Status: None   Collection Time: 06/20/2015  4:10 AM  Result Value Ref Range Status   MRSA by PCR NEGATIVE NEGATIVE Final    Comment:        The GeneXpert MRSA Assay (FDA approved for NASAL specimens only), is one component of a comprehensive MRSA colonization surveillance program. It is not intended to diagnose MRSA infection nor to guide or monitor treatment for MRSA infections.    Medical History: Past Medical History  Diagnosis Date  . Tobacco use disorder 04/23/2015    Quit Fall 2016  . ETOH abuse     Quit Fall 2016  . Illicit drug use     Quit Fall 2016  . On home O2   . 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 Pleural effusion on right    Anti-infectives    Start     Dose/Rate Route Frequency Ordered Stop   07/16/2015 1800  vancomycin (VANCOCIN) IVPB 750 mg/150 ml premix     750 mg 150 mL/hr over 60 Minutes Intravenous Every 12 hours 07/02/2015 0750     07/06/2015 1400  piperacillin-tazobactam  (ZOSYN) IVPB 3.375 g     3.375 g 12.5 mL/hr over 240 Minutes Intravenous Every 8 hours 06/26/2015 0749     07/13/2015 0300  vancomycin (VANCOCIN) IVPB 1000 mg/200 mL premix     1,000 mg 200 mL/hr over 60 Minutes Intravenous  Once 06/25/2015 0249 06/19/2015 0419   06/19/2015 0300  piperacillin-tazobactam (ZOSYN) IVPB 4.5 g     4.5 g 200 mL/hr over 30 Minutes Intravenous  Once 06/20/2015 0249 06/17/2015 7893     Assessment: 54 yo male h/o adenocarcinoma unknown primary with right sided chronic pleural effusion on 6 liters [place] at home woke up around midnight sob and noticed his electricity was out. He lives with his mother, who is 20 years old who had to go to the neighbors house as their phone was also out to call 911. Upon arrival of EMS his oxygen sats were in the 60s and pt was very tachypneic. bipap was placed. Pt feeling better on bipap. Denies fevers. Pt just started chemo tx last month per his report. Pt started on Vancomycin and Zosyn for pna Trough level is below goal, renal fxn stable  Pharmacokinetic dosing service   Vancomycin single level analysis: Current dose being given: 750 mg Current dosing interval:  12 hrs Current infusion time (hrs): 1  Single level Trough Data:  Trough level obtained: 10 mcg/ml Timing of trough - Number of hours since  last dose:  10 Hrs Desired peak:  35 mcg/ml  Desired trough: 16 mcg/ml  Estimated PK Parameters: --------------------------- New rate constant (kel): 0.110 hr-1 New half-life: 6.30 Hours New Vd from levels: 37.31  Liters  (0.7 L/kg)  Recommendations: ==================== Give Vancomycin  750 mg  q 8 hrs. Infuse over 1 hrs Expected Cpeak: 32.5 mcg/ml    Expected Ctrough: 15.0 mcg/ml  Recommended labs and intervals: Measure Bun and Scr 3 times/week.   Thank you for the consult, will continue to follow.  Goal of Therapy:  Vancomycin trough level 15-20 mcg/ml  Plan:  Zosyn 3.375gm IV q8h, EID Increase Vancomycin to '750mg'$  IV  q8hrs Check trough level weekly or sooner if warranted Monitor labs, renal fxn, progress and c/s  [place] Crane, Stokes Rattigan A 06/21/2015,10:51 AM

## 2015-06-21 NOTE — Progress Notes (Signed)
Subjective: He says he still short of breath. I took him off BiPAP so he can talk and he initially held his oxygen saturation in the 90s but now he has dropped into the 70s and then placed back on BiPAP. He only had a small amount of fluid draining from his Pleurx catheter yesterday.  Objective: Vital signs in last 24 hours: Temp:  [97.2 F (36.2 C)-97.9 F (36.6 C)] 97.6 F (36.4 C) (12/06 0500) Pulse Rate:  [76-98] 85 (12/06 0700) Resp:  [12-28] 19 (12/06 0700) BP: (106-162)/(62-98) 132/79 mmHg (12/06 0700) SpO2:  [78 %-100 %] 95 % (12/06 0758) FiO2 (%):  [55 %-100 %] 55 % (12/06 0758) Weight:  [53.3 kg (117 lb 8.1 oz)] 53.3 kg (117 lb 8.1 oz) (12/06 0500) Weight change: -0.2 kg (-7.1 oz) Last BM Date: 06/17/15  Intake/Output from previous day: 12/05 0701 - 12/06 0700 In: 755.5 [P.O.:340; I.V.:115.5; IV Piggyback:300] Out: 1050 [Urine:1000; Chest Tube:50]  PHYSICAL EXAM General appearance: alert and moderate distress Resp: Diminished breath sounds on the right rales and rhonchi on the left Cardio: regular rate and rhythm, S1, S2 normal, no murmur, click, rub or gallop GI: soft, non-tender; bowel sounds normal; no masses,  no organomegaly Extremities: extremities normal, atraumatic, no cyanosis or edema  Lab Results:  Results for orders placed or performed during the hospital encounter of 06/30/2015 (from the past 48 hour(s))  Vancomycin, trough     Status: None   Collection Time: 06/21/15  4:34 AM  Result Value Ref Range   Vancomycin Tr 10 10.0 - 20.0 ug/mL  Basic metabolic panel     Status: Abnormal   Collection Time: 06/21/15  4:34 AM  Result Value Ref Range   Sodium 138 135 - 145 mmol/L   Potassium 4.8 3.5 - 5.1 mmol/L   Chloride 85 (L) 101 - 111 mmol/L   CO2 47 (H) 22 - 32 mmol/L   Glucose, Bld 97 65 - 99 mg/dL   BUN 21 (H) 6 - 20 mg/dL   Creatinine, Ser 0.48 (L) 0.61 - 1.24 mg/dL   Calcium 9.7 8.9 - 10.3 mg/dL   GFR calc non Af Amer >60 >60 mL/min   GFR calc Af  Amer >60 >60 mL/min    Comment: (NOTE) The eGFR has been calculated using the CKD EPI equation. This calculation has not been validated in all clinical situations. eGFR's persistently <60 mL/min signify possible Chronic Kidney Disease.    Anion gap 6 5 - 15  CBC     Status: Abnormal   Collection Time: 06/21/15  4:34 AM  Result Value Ref Range   WBC 32.0 (H) 4.0 - 10.5 K/uL   RBC 3.21 (L) 4.22 - 5.81 MIL/uL   Hemoglobin 8.6 (L) 13.0 - 17.0 g/dL   HCT 29.7 (L) 39.0 - 52.0 %   MCV 92.5 78.0 - 100.0 fL   MCH 26.8 26.0 - 34.0 pg   MCHC 29.0 (L) 30.0 - 36.0 g/dL   RDW 18.8 (H) 11.5 - 15.5 %   Platelets 648 (H) 150 - 400 K/uL    ABGS No results for input(s): PHART, PO2ART, TCO2, HCO3 in the last 72 hours.  Invalid input(s): PCO2 CULTURES Recent Results (from the past 240 hour(s))  MRSA PCR Screening     Status: None   Collection Time: 06/16/2015  4:10 AM  Result Value Ref Range Status   MRSA by PCR NEGATIVE NEGATIVE Final    Comment:        The GeneXpert  MRSA Assay (FDA approved for NASAL specimens only), is one component of a comprehensive MRSA colonization surveillance program. It is not intended to diagnose MRSA infection nor to guide or monitor treatment for MRSA infections.    Studies/Results: Dg Chest Port 1 View  06/19/2015  CLINICAL DATA:  Respiratory failure.  Metastatic adenocarcinoma. EXAM: PORTABLE CHEST 1 VIEW COMPARISON:  Radiographs 06/06/2015 and 07/15/2015.  CT 06/01/2015. FINDINGS: 1023 hours. Right subclavian Port-A-Cath appears unchanged at the SVC right atrial level. There is stable near complete opacification of the right hemithorax with volume loss, complex pleural fluid and underlying pulmonary opacity. Airspace opacities throughout the left lung are stable, sparing the apex which is nearly completely replaced by a large bulla. The visualized mediastinal contours are stable. IMPRESSION: No significant change in the radiographic appearance of the chest.  Persistent bilateral airspace opacities with near complete opacification of the right hemithorax. Electronically Signed   By: Richardean Sale M.D.   On: 06/19/2015 11:06    Medications:  Prior to Admission:  Prescriptions prior to admission  Medication Sig Dispense Refill Last Dose  . albuterol (PROVENTIL HFA;VENTOLIN HFA) 108 (90 BASE) MCG/ACT inhaler Inhale 2 puffs into the lungs every 6 (six) hours as needed for wheezing or shortness of breath. 1 Inhaler 2 06/17/2015 at Unknown time  . amoxicillin-clavulanate (AUGMENTIN) 500-125 MG tablet Take 1 tablet (500 mg total) by mouth 3 (three) times daily. 30 tablet 0 06/17/2015 at Unknown time  . CARBOPLATIN IV Inject into the vein. Every 21 days   06/03/2015  . dexamethasone (DECADRON) 4 MG tablet The day before chemo take 5 tabs in the am and 5 tabs in the pm. The morning of chemo take 5 tabs prior to chemo appt. 45 tablet 1 06/17/2015 at Unknown time  . fluconazole (DIFLUCAN) 100 MG tablet Take 1 tablet (100 mg total) by mouth daily. 14 tablet 0 06/17/2015 at Unknown time  . Fluticasone-Salmeterol (ADVAIR DISKUS) 250-50 MCG/DOSE AEPB Inhale 1 puff into the lungs 2 (two) times daily. 60 each 12 06/17/2015 at Unknown time  . guaiFENesin (MUCINEX) 600 MG 12 hr tablet Take 1 tablet (600 mg total) by mouth 2 (two) times daily as needed. 30 tablet 2 06/17/2015 at Unknown time  . lidocaine-prilocaine (EMLA) cream Apply a quarter size amount to port site 1 hour prior to chemo. Do not rub in. Cover with plastic wrap. 30 g 2 Taking  . megestrol (MEGACE) 400 MG/10ML suspension Take 10 mLs (400 mg total) by mouth 2 (two) times daily. 300 mL 1 06/17/2015 at Unknown time  . morphine (MS CONTIN) 15 MG 12 hr tablet Take 1 tablet (15 mg total) by mouth every 12 (twelve) hours. 60 tablet 0 06/17/2015 at Unknown time  . ondansetron (ZOFRAN) 8 MG tablet Take 1 tablet (8 mg total) by mouth every 8 (eight) hours as needed for nausea or vomiting. 30 tablet 2 06/17/2015 at Unknown  time  . oxyCODONE-acetaminophen (PERCOCET/ROXICET) 5-325 MG tablet Take 1-2 tablets by mouth every 4 (four) hours as needed for moderate pain or severe pain. 100 tablet 0 06/17/2015 at Unknown time  . temazepam (RESTORIL) 15 MG capsule Take 1 capsule (15 mg total) by mouth at bedtime as needed for sleep. 30 capsule 1 06/17/2015 at Unknown time  . predniSONE (STERAPRED UNI-PAK 21 TAB) 10 MG (21) TBPK tablet Take 1 tablet (10 mg total) by mouth daily. 4 tab po daily for b3 days, then 3 tab po daily for 3 days, 2 tab po daily for  3 days and 1 tab po daily for 3 days, (Patient not taking: Reported on 06/20/2015) 30 tablet 0 Completed Course at Unknown time   Scheduled: . enoxaparin (LOVENOX) injection  40 mg Subcutaneous Q24H  . feeding supplement (ENSURE ENLIVE)  237 mL Oral BID BM  . ipratropium-albuterol  3 mL Nebulization Q6H  . methylPREDNISolone (SOLU-MEDROL) injection  80 mg Intravenous Q12H  . morphine  15 mg Oral Q12H  . piperacillin-tazobactam (ZOSYN)  IV  3.375 g Intravenous Q8H  . sodium chloride  3 mL Intravenous Q12H  . vancomycin  750 mg Intravenous Q12H   Continuous:  CEY:EMVVKP chloride, albuterol, ondansetron **OR** ondansetron (ZOFRAN) IV, oxyCODONE-acetaminophen, sodium chloride  Assesment: He has acute hypoxic respiratory failure this is on top of his chronic hypoxic respiratory failure. 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 I'm going to have him get a CT to see what's going on with that because were not able to drain much fluid. At baseline he has severe COPD and protein calorie malnutrition which is severe. He has metastatic adenocarcinoma of unknown primary and has infiltrates in his left lung but I'm beginning to think these may be more related to metastatic disease and then to pneumonia. I would however continue treatment for pneumonia for now. 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 He obviously is not going to be able to move out of the ICU. He is critically and may be short-term terminally ill    LOS: 3 days   Montavious Wierzba L 06/21/2015, 8:07 AM

## 2015-06-21 NOTE — Progress Notes (Signed)
Subjective: Patient is on BIPAP. He is desaturating when is off BIPAP. His pleurex catheter is not draining  much .  Objective: Vital signs in last 24 hours: Temp:  [97.2 F (36.2 C)-97.9 F (36.6 C)] 97.6 F (36.4 C) (12/06 0500) Pulse Rate:  [76-98] 85 (12/06 0700) Resp:  [12-28] 19 (12/06 0700) BP: (106-162)/(62-98) 132/79 mmHg (12/06 0700) SpO2:  [78 %-100 %] 95 % (12/06 0758) FiO2 (%):  [55 %-100 %] 55 % (12/06 0758) Weight:  [53.3 kg (117 lb 8.1 oz)] 53.3 kg (117 lb 8.1 oz) (12/06 0500) Weight change: -0.2 kg (-7.1 oz) Last BM Date: 06/17/15  Intake/Output from previous day: 12/05 0701 - 12/06 0700 In: 755.5 [P.O.:340; I.V.:115.5; IV Piggyback:300] Out: 1050 [Urine:1000; Chest Tube:50]  PHYSICAL EXAM General appearance: cachectic, moderate distress and slowed mentation Resp: diminished breath sounds bilaterally and rhonchi bilaterally Cardio: S1, S2 normal GI: soft, non-tender; bowel sounds normal; no masses,  no organomegaly Extremities: extremities normal, atraumatic, no cyanosis or edema  Lab Results:  Results for orders placed or performed during the hospital encounter of 06/17/2015 (from the past 48 hour(s))  Vancomycin, trough     Status: None   Collection Time: 06/21/15  4:34 AM  Result Value Ref Range   Vancomycin Tr 10 10.0 - 20.0 ug/mL  Basic metabolic panel     Status: Abnormal   Collection Time: 06/21/15  4:34 AM  Result Value Ref Range   Sodium 138 135 - 145 mmol/L   Potassium 4.8 3.5 - 5.1 mmol/L   Chloride 85 (L) 101 - 111 mmol/L   CO2 47 (H) 22 - 32 mmol/L   Glucose, Bld 97 65 - 99 mg/dL   BUN 21 (H) 6 - 20 mg/dL   Creatinine, Ser 0.48 (L) 0.61 - 1.24 mg/dL   Calcium 9.7 8.9 - 10.3 mg/dL   GFR calc non Af Amer >60 >60 mL/min   GFR calc Af Amer >60 >60 mL/min    Comment: (NOTE) The eGFR has been calculated using the CKD EPI equation. This calculation has not been validated in all clinical situations. eGFR's persistently <60 mL/min signify  possible Chronic Kidney Disease.    Anion gap 6 5 - 15  CBC     Status: Abnormal   Collection Time: 06/21/15  4:34 AM  Result Value Ref Range   WBC 32.0 (H) 4.0 - 10.5 K/uL   RBC 3.21 (L) 4.22 - 5.81 MIL/uL   Hemoglobin 8.6 (L) 13.0 - 17.0 g/dL   HCT 29.7 (L) 39.0 - 52.0 %   MCV 92.5 78.0 - 100.0 fL   MCH 26.8 26.0 - 34.0 pg   MCHC 29.0 (L) 30.0 - 36.0 g/dL   RDW 18.8 (H) 11.5 - 15.5 %   Platelets 648 (H) 150 - 400 K/uL    ABGS No results for input(s): PHART, PO2ART, TCO2, HCO3 in the last 72 hours.  Invalid input(s): PCO2 CULTURES Recent Results (from the past 240 hour(s))  MRSA PCR Screening     Status: None   Collection Time: 06/25/2015  4:10 AM  Result Value Ref Range Status   MRSA by PCR NEGATIVE NEGATIVE Final    Comment:        The GeneXpert MRSA Assay (FDA approved for NASAL specimens only), is one component of a comprehensive MRSA colonization surveillance program. It is not intended to diagnose MRSA infection nor to guide or monitor treatment for MRSA infections.    Studies/Results: Dg Chest Port 1 View  06/19/2015  CLINICAL DATA:  Respiratory failure.  Metastatic adenocarcinoma. EXAM: PORTABLE CHEST 1 VIEW COMPARISON:  Radiographs 06/06/2015 and 06/19/2015.  CT 06/01/2015. FINDINGS: 1023 hours. Right subclavian Port-A-Cath appears unchanged at the SVC right atrial level. There is stable near complete opacification of the right hemithorax with volume loss, complex pleural fluid and underlying pulmonary opacity. Airspace opacities throughout the left lung are stable, sparing the apex which is nearly completely replaced by a large bulla. The visualized mediastinal contours are stable. IMPRESSION: No significant change in the radiographic appearance of the chest. Persistent bilateral airspace opacities with near complete opacification of the right hemithorax. Electronically Signed   By: Richardean Sale M.D.   On: 06/19/2015 11:06    Medications: I have reviewed the  patient's current medications.  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 Luan Pulling and planned to do Chest CT Continue supportive care   LOS: 3 days   Brian Nelson 06/21/2015, 8:04 AM

## 2015-06-21 NOTE — Plan of Care (Signed)
Problem: ICU Phase Progression Outcomes Goal: O2 sats trending toward baseline Outcome: Not Progressing Pt requiring increased amounts of oxygen this evening to maintain oxygen saturations    Goal: Dyspnea controlled at rest Outcome: Not Progressing Patient remains with labored breathing, continues to talk even though he is unable to catch his breath Goal: Hemodynamically stable Outcome: Progressing All vital signs except O2 sats are stable Goal: Pain controlled with appropriate interventions Outcome: Progressing Patient is on scheduled pain medications with PRN medications for break through pain

## 2015-06-21 NOTE — Clinical Documentation Improvement (Signed)
Internal Medicine  Can the diagnosis of pressure ulcer be further specified?   Document if pressure ulcer with stage is Present on Admission   Document Site with laterality - Elbow, Back (upper/lower), Sacral, Hip, Buttock, Ankle, Heel, Head, Other (Specify)  Pressure Ulcer Stage - Stage1, Stage 2, Stage 3, Stage 4, Unstageable, Unspecified, Unable to Clinically Determine  Document any associated diagnoses/conditions such as: with gangrene  Other  Clinically Undetermined  Please update your documentation within the medical record to reflect your response to this query. Thank you.  Supporting Information:(As per notes) Active Problems:  Pressure ulcer  Please exercise your independent, professional judgment when responding. A specific answer is not anticipated or expected.  Thank You, Alessandra Grout, RN, BSN, CCDS,Clinical Documentation Specialist:  [phone]  [phone]=[facility] [place]- [facility] Information Management

## 2015-06-21 NOTE — Progress Notes (Signed)
Patient removed from Bipap short period of time oxygen saturation decreased 77%-85%. Patient placed on Bipap.

## 2015-06-21 NOTE — Progress Notes (Signed)
[facility]  Patient Status: Active (receiving services up to time of hospitalization)  [facility] is providing the following services: RN   If patient discharges after hours, please call [phone].   Brian Nelson 06/21/2015, 10:42 AM

## 2015-06-24 ENCOUNTER — Ambulatory Visit (HOSPITAL_COMMUNITY): Payer: Medicaid Other | Admitting: Hematology & Oncology

## 2015-06-24 ENCOUNTER — Inpatient Hospital Stay (HOSPITAL_COMMUNITY): Payer: Medicaid Other

## 2015-06-30 NOTE — Progress Notes (Signed)
Brian Nelson at [place] NOTE  Patient Care Team: Rosita Fire, MD as PCP - General (Internal Medicine)  CHIEF COMPLAINTS/PURPOSE OF CONSULTATION:  Abnormal CT imaging of Chest Bronchus Biopsy 04/27/2015 RUL no malignancy Tobacco Abuse, 2 ppd ETOH abuse Illicit drug use Hypoxemia secondary to pleural effusion Primary bronchogenic carcinoma with morphologic features favoring adenocarcinoma, bronchoscopy by Dr. Lake Bells Protein calorie malnutrition, greater than 20 lb weight loss Negative MRI brain [id number] Carcinoma of unknown primary Elevated CEA Colonoscopy/EGD on 05/06/2015 essentially unremarkable  HISTORY OF PRESENTING ILLNESS:  Brian Nelson 54 y.o. male is here because of newly diagnosed bronchogenic carcinoma. He presented to the ED with worsening SOB and chest pain initially on 04/22/2015. He was seen by Dr. 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 Lung vs GI. Please refer to inpatient consultation note dated 05/05/2015. He is here today for ongoing care and treatment recommendations. Catheter has been placed and he notes that it has helped with his breathing. He drains it daily. He estimates approximately 50 cc of fluid daily.       04/22/2015 Imaging CT chest- Advanced right upper lobe pneumonia. Extensive empyema at the right lung base. Compressive atelectasis of the right lung. Background pattern of centrilobular emphysema with a 12 cm bulla at the left lung apex. Areas of abnormal density in    04/23/2015 Procedure R thoracentesis   04/23/2015 Pathology Results MALIGNANT CELLS PRESENT, CONSISTENT WITH NON SMALL CELL CARCINOMA.  THE POSITIVE STAINING WITH CDX-2 RAISES THE POSSIBILITY OF A GASTROINTESTINAL PRIMARY   04/27/2015 Imaging CT abd/pelvis- Moderate right pleural effusion with loculation and pleural nodularity suggesting malignant effusion. Atelectasis in the right  lung base. Ground-glass nodules in the left lung base, largest 1.1 cm. This is likely metastatic although infl   04/27/2015 Procedure Bronchoscopy by Dr. Lake Bells (Pulmonology)   04/27/2015 Pathology Results BRONCHIAL BRUSHING, RIGHT UPPER LOBE (SPECIMEN 2 OF 2 COLLECTED 04-27-2015) SUSPICIOUS FOR MALIGNANCY.   04/27/2015 Pathology Results BRONCHIAL LAVAGE RIGHT UPPER LOBE (SPECIMEN 1 OF 2 COLLECTED 04/27/2015) MALIGNANT CELLS PRESENT,   04/28/2015 Imaging MRI brain- Normal MRI appearance of the brain. No evidence for metastatic disease to the brain or meninges. Degenerative changes in the upper cervical spine without definite osseous metastases.   05/03/2015 Imaging CT chest- Persistent large at least partially loculated RIGHT pleural effusion with scattered areas of pleural thickening and enhancement consistent with malignant pleural effusion as identified by prior thoracentesis and cytology. Complete atelect   05/04/2015 Procedure R thoracentesis   05/04/2015 Pathology Results PLEURAL FLUID, RIGHT (SPECIMEN 1 OF 1 COLLECTED 05/03/18): MALIGNANT CELLS PRESENT   05/05/2015 Imaging Bone scan- No evidence of osseous metastatic disease.   05/06/2015 Procedure EGD/Colonoscopy- Negative for findings suggestive of malignancy.  Performed by Dr. Oneida Alar.   05/08/2015 Imaging CT angio chest- No pulmonary embolus. 2. Large partially loculated right pleural effusion with near complete atelectasis and consolidation of the right lung. The pleural effusion has increased in size compared to exam 5 days prior. 3. Advanced emphys   05/11/2015 Procedure Port-A-Cath insertion, by Dr. Arnoldo Morale   05/12/2015 Procedure Right PleurX placed by IR   05/13/2015 Pathology Results CANCERTYPE ID- 62% probability of gastroesophageal adenocarcinoma, cannot exclude pancreaticobiliary (7%) and intestine.     05/13/2015 Pathology Results Cancer types rule out w 95% confidence: Adrenal, brain, GIST, Germ cell, H&N, kidney, HCC, Lung,  lymphoma, melanoma, meningioma, mesothelioma, neuroendocrine, prostate, sarcoma, sex cord stromal,squamous cell, basal  cell, thymus, thyroid, urinary bladder   05/13/2015 Pathology Results HER2 negative by FISH, equivocal by [facility]   05/17/2015 Pathology Results [facility]- not enough tissue for testing.    MEDICAL HISTORY:  Past Medical History  Diagnosis Date  . Tobacco use disorder 04/23/2015    Quit Fall 2016  . ETOH abuse     Quit Fall 2016  . Illicit drug use     Quit Fall 2016  . On home O2   . Adenocarcinoma of unknown primary, CancerType ID showing 89% probability of Gastroesophageal Adenocarcinoma ([employer])   . Pleural effusion on right     SURGICAL HISTORY: Past Surgical History  Procedure Laterality Date  . Video bronchoscopy Bilateral 04/27/2015    Procedure: VIDEO BRONCHOSCOPY WITH FLUORO;  Surgeon: Juanito Doom, MD;  Location: [facility];  Service: Cardiopulmonary;  Laterality: Bilateral;  . Hernia repair Right 2009    [place]  . Colonoscopy with propofol N/A 05/06/2015    Procedure: COLONOSCOPY WITH PROPOFOL; IN CECUM AT 1159; WITHDRAWAL TIME 23 MINUTES;  Surgeon: Danie Binder, MD;  Location: [facility];  Service: Endoscopy;  Laterality: N/A;  . Esophagogastroduodenoscopy (egd) with propofol N/A 05/06/2015    Procedure: ESOPHAGOGASTRODUODENOSCOPY (EGD) WITH PROPOFOL;  Surgeon: Danie Binder, MD;  Location: [facility];  Service: Endoscopy;  Laterality: N/A;  . Portacath placement Right 05/11/2015    Procedure: INSERTION PORT-A-CATH;  Surgeon: Aviva Signs, MD;  Location: [facility];  Service: General;  Laterality: Right;  . Plurex catheter Right     SOCIAL HISTORY: Social History   Social History  . Marital Status: Married    Spouse Name: N/A  . Number of Children: N/A  . Years of Education: N/A   Occupational History  . Concrete work    Social History Main Topics  . Smoking status: Former Smoker -- 2.00 packs/day for 40 years    Types:  Cigarettes    Quit date: 04/22/2015  . Smokeless tobacco: Former Systems developer  . Alcohol Use: 3.6 oz/week    6 Cans of beer per week     Comment: Quit in fall 2016  . Drug Use: Yes    Special: Cocaine, Marijuana     Comment: Quit in Fall 2016  . Sexual Activity: Not on file   Other Topics Concern  . Not on file   Social History Narrative  He has a 80 pack-year smoking history. He has never used smokeless tobacco. He reports that he drinks about 3.6 oz of alcohol per week. He reports that he uses illicit drugs (Cocaine and Marijuana). He is divorced.  He was a Psychiatrist  FAMILY HISTORY: Family History  Problem Relation Age of Onset  . Diabetes Mother   His mother is 99 years old and relatively healthy. He has 1 sister who is 63 years old. His father passes away in his 21's. He has 2 children, 2 boys, ages 48 and 40.  ALLERGIES:  has No Known Allergies.  MEDICATIONS:  Current Outpatient Prescriptions  Medication Sig Dispense Refill  . Fluticasone-Salmeterol (ADVAIR DISKUS) 250-50 MCG/DOSE AEPB Inhale 1 puff into the lungs 2 (two) times daily. 60 each 12  . albuterol (PROVENTIL HFA;VENTOLIN HFA) 108 (90 BASE) MCG/ACT inhaler Inhale 2 puffs into the lungs every 6 (six) hours as needed for wheezing or shortness of breath. 1 Inhaler 2  . amoxicillin-clavulanate (AUGMENTIN) 500-125 MG tablet Take 1 tablet (500 mg total) by mouth 3 (three) times daily. 30 tablet 0  .  CARBOPLATIN IV Inject into the vein. Every 21 days    . dexamethasone (DECADRON) 4 MG tablet The day before chemo take 5 tabs in the am and 5 tabs in the pm. The morning of chemo take 5 tabs prior to chemo appt. 45 tablet 1  . fluconazole (DIFLUCAN) 100 MG tablet Take 1 tablet (100 mg total) by mouth daily. 14 tablet 0  . guaiFENesin (MUCINEX) 600 MG 12 hr tablet Take 1 tablet (600 mg total) by mouth 2 (two) times daily as needed. 30 tablet 2  . lidocaine-prilocaine (EMLA) cream Apply a quarter size amount to port site 1 hour  prior to chemo. Do not rub in. Cover with plastic wrap. 30 g 2  . megestrol (MEGACE) 400 MG/10ML suspension Take 10 mLs (400 mg total) by mouth 2 (two) times daily. 300 mL 1  . morphine (MS CONTIN) 15 MG 12 hr tablet Take 1 tablet (15 mg total) by mouth every 12 (twelve) hours. 60 tablet 0  . ondansetron (ZOFRAN) 8 MG tablet Take 1 tablet (8 mg total) by mouth every 8 (eight) hours as needed for nausea or vomiting. 30 tablet 2  . oxyCODONE-acetaminophen (PERCOCET/ROXICET) 5-325 MG tablet Take 1-2 tablets by mouth every 4 (four) hours as needed for moderate pain or severe pain. 100 tablet 0  . predniSONE (STERAPRED UNI-PAK 21 TAB) 10 MG (21) TBPK tablet Take 1 tablet (10 mg total) by mouth daily. 4 tab po daily for b3 days, then 3 tab po daily for 3 days, 2 tab po daily for 3 days and 1 tab po daily for 3 days, (Patient not taking: Reported on 06/26/2015) 30 tablet 0  . temazepam (RESTORIL) 15 MG capsule Take 1 capsule (15 mg total) by mouth at bedtime as needed for sleep. 30 capsule 1   No current facility-administered medications for this visit.    Review of Systems  Constitutional: Positive for weight loss and malaise/fatigue.  Eyes: Negative.   Respiratory: Positive for cough, hemoptysis, sputum production and shortness of breath.   Cardiovascular: Positive for chest pain. Negative for orthopnea, claudication, leg swelling and PND.  Gastrointestinal: Negative.   Genitourinary: Negative.   Musculoskeletal: Positive for neck pain.  Skin: Negative.   Neurological: Positive for weakness. Negative for dizziness, tingling, tremors, sensory change, speech change, focal weakness, seizures, loss of consciousness and headaches.  Endo/Heme/Allergies: Negative.   Psychiatric/Behavioral: Positive for substance abuse. Negative for depression, suicidal ideas, hallucinations and memory loss. The patient is nervous/anxious. The patient does not have insomnia.    14 point ROS was done and is otherwise as  detailed above or in HPI   PHYSICAL EXAMINATION: ECOG PERFORMANCE STATUS: 2 - Symptomatic, <50% confined to bed  Filed Vitals:   05/19/15 1424  BP: 166/66  Pulse: 85  Temp: 98.3 F (36.8 C)  Resp: 16   Filed Weights   05/19/15 1424  Weight: 129 lb 6.4 oz (58.695 kg)     Physical Exam  Constitutional: He is oriented to person, place, and time.  Very thin, chronically ill appearing male  HENT:  Head: Normocephalic and atraumatic.  Nose: Nose normal.  Mouth/Throat: Oropharynx is clear and moist. No oropharyngeal exudate.  Eyes: Conjunctivae and EOM are normal. Pupils are equal, round, and reactive to light. Right eye exhibits no discharge. Left eye exhibits no discharge. No scleral icterus.  Neck: Normal range of motion. Neck supple. No tracheal deviation present. No thyromegaly present.  Cardiovascular: Normal rate, regular rhythm and normal heart sounds.  Exam reveals no gallop and no friction rub.   No murmur heard. Pulmonary/Chest: He has no wheezes. He has no rales.  Decreased BS throughout R>L  Abdominal: Soft. Bowel sounds are normal. He exhibits no distension and no mass. There is no tenderness. There is no rebound and no guarding.  Very thin  Musculoskeletal: Normal range of motion. He exhibits no edema.  Lymphadenopathy:    He has no cervical adenopathy.  Neurological: He is alert and oriented to person, place, and time. He has normal reflexes. No cranial nerve deficit. Gait normal. Coordination normal.  Skin: Skin is warm and dry. No rash noted.  Psychiatric: Mood, memory, affect and judgment normal.  Nursing note and vitals reviewed.   LABORATORY DATA:  I have reviewed the data as listed  Results for KATIE, MOCH (MRN [id number])  Ref. Range 05/19/2015 15:30  Sodium Latest Ref Range: 135-145 mmol/L 135  Potassium Latest Ref Range: 3.5-5.1 mmol/L 4.1  Chloride Latest Ref Range: 101-111 mmol/L 99 (L)  CO2 Latest Ref Range: 22-32 mmol/L 27  BUN Latest Ref  Range: 6-20 mg/dL 11  Creatinine Latest Ref Range: 0.61-1.24 mg/dL 1.01  Calcium Latest Ref Range: 8.9-10.3 mg/dL 9.2  EGFR (Non-African Amer.) Latest Ref Range: >60 mL/min >60  EGFR (African American) Latest Ref Range: >60 mL/min >60  Glucose Latest Ref Range: 65-99 mg/dL 109 (H)  Anion gap Latest Ref Range: 5-15  9  Alkaline Phosphatase Latest Ref Range: 38-126 U/L 100  Albumin Latest Ref Range: 3.5-5.0 g/dL 2.5 (L)  AST Latest Ref Range: 15-41 U/L 25  ALT Latest Ref Range: 17-63 U/L 28  Total Protein Latest Ref Range: 6.5-8.1 g/dL 6.8  Total Bilirubin Latest Ref Range: 0.3-1.2 mg/dL 0.4  WBC Latest Ref Range: 4.0-10.5 K/uL 14.3 (H)  RBC Latest Ref Range: 4.22-5.81 MIL/uL 3.21 (L)  Hemoglobin Latest Ref Range: 13.0-17.0 g/dL 8.9 (L)  HCT Latest Ref Range: 39.0-52.0 % 27.2 (L)  MCV Latest Ref Range: 78.0-100.0 fL 84.7  MCH Latest Ref Range: 26.0-34.0 pg 27.7  MCHC Latest Ref Range: 30.0-36.0 g/dL 32.7  RDW Latest Ref Range: 11.5-15.5 % 14.6  Platelets Latest Ref Range: 150-400 K/uL 1177 (HH)    RADIOGRAPHIC STUDIES: I have personally reviewed the radiological images as listed and agreed with the findings in the report.   CLINICAL DATA: Malignant effusion. Cytology positive for non-small cell carcinoma.  EXAM: MRI HEAD WITHOUT AND WITH CONTRAST  IMPRESSION: 1. Normal MRI appearance of the brain. No evidence for metastatic disease to the brain or meninges. 2. Degenerative changes in the upper cervical spine without definite osseous metastases.   Electronically Signed  By: San Morelle M.D.  On: 04/28/2015 12:10    CLINICAL DATA: Malignant cells on recent right thoracentesis. Initial encounter.  EXAM: NUCLEAR MEDICINE WHOLE BODY BONE SCAN  TECHNIQUE: Whole body anterior and posterior images were obtained approximately 3 hours after intravenous injection of radiopharmaceutical.  RADIOPHARMACEUTICALS: 27.01 mCi Technetium-55mMDP  IV  COMPARISON: Chest CT 05/03/2015. Abdominal pelvic CT 04/27/2015.  FINDINGS: There is no osseous activity favoring metastatic disease. There is a mild thoracolumbar scoliosis. There is mild degenerative activity at the shoulders, knees and great toes bilaterally. The soft tissue activity appears unremarkable.  IMPRESSION: No evidence of osseous metastatic disease.   Electronically Signed  By: WRichardean SaleM.D.  On: 05/05/2015 14:29     CLINICAL DATA: Chest pain and shortness of breath for couple weeks, recent CT chest with RIGHT pleural effusion, underwent thoracentesis, re-accumulation of fluid in increased infiltrate,  history COPD ; cytology positive for malignant effusion though uncertain as to whether pulmonary or GI source  EXAM: CT CHEST WITH CONTRAST  TECHNIQUE: Multidetector CT imaging of the chest was performed during intravenous contrast administration. Sagittal and coronal MPR images reconstructed from axial data set.  CONTRAST: 76m OMNIPAQUE IOHEXOL 300 MG/ML SOLN IV  IMPRESSION: Persistent large at least partially loculated RIGHT pleural effusion with scattered areas of pleural thickening and enhancement consistent with malignant pleural effusion as identified by prior thoracentesis and cytology.  Complete atelectasis of RIGHT middle and RIGHT lower lobes.  Subtotal opacification of the RIGHT upper lobe may represent diffuse consolidation or tumor.  Mediastinal and suspected RIGHT hilar adenopathy with upper normal sized LEFT hilar nodes.  Minimal patchy infiltrate LEFT lung increased since previous exam including several subpleural nodular foci in the LEFT upper and LEFT lower lobes, question infection versus tumor.  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   Electronically Signed  By: MLavonia DanaM.D.  On: 05/03/2015 18:59  CLINICAL DATA: Malignant pleural effusions suggesting GI origin.  No abdominal complaints.  EXAM: CT ABDOMEN AND PELVIS WITH CONTRAST  TECHNIQUE: Multidetector CT imaging of the abdomen and pelvis was performed using the standard protocol following bolus administration of intravenous contrast.  CONTRAST: 1017mOMNIPAQUE IOHEXOL 300 MG/ML SOLN  COMPARISON: Ultrasound abdomen 04/23/2015 IMPRESSION: Moderate right pleural effusion with loculation and pleural nodularity suggesting malignant effusion. Atelectasis in the right lung base. Ground-glass nodules in the left lung base, largest 1.1 cm. This is likely metastatic although inflammatory nodularity not entirely excluded. No definite evidence of malignancy demonstrated in the abdomen or pelvis although technical factors limits evaluation of bowel and colon. Bladder wall thickening may be due to cystitis or outlet obstruction.   Electronically Signed  By: WiLucienne Capers.D.  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 McLake Bellsrotein calorie malnutrition, greater than 20 lb weight loss Negative MRI brain Elevated CEA Colonoscopy/EGD on 05/06/2015 essentially unremarkable Reactive Thrombocytosis Anemia of chronic disease  Most likely patient will be treated with carboplatin/taxol.  I have sent his tumor block for carcinoma of unknown primary testing. Symptomatically he is somewhat better, his PS is marginal.  I have addressed that his cancer is not curable and also my concerns with his ability to tolerate therapy given his nutritional status and PS. He wishes to try treatment.  His clinical presentation is c/w primary lung and would also recommend sending off [facility]  testing.  Plan will be for institution of chemotherapy hopfully within the next week. Pain seems to be well controlled. Breathing is improved.  I addressed the importance of smoking cessation with the patient in detail. We discussed other alternatives to quit such as chantix, wellbutrin. We will continue to address this moving forward.   He seems to have a fairly good support network in place. His Ex-wife is here today and has been helping him. He resides with his mother, and has a large family. He has met [facility] navigator.  We discussed nutritional issues as well.  He will return next week.  Orders Placed This Encounter  Procedures  . CBC with Differential  . Comprehensive metabolic panel  . Pathologist smear review    All questions were answered.  The patient knows to call the clinic with any problems, questions or concerns.  I spent 40 minutes with the patient and family today.    Molli Hazard, MD

## 2015-07-15 ENCOUNTER — Inpatient Hospital Stay (HOSPITAL_COMMUNITY): Payer: Medicaid Other

## 2015-07-15 ENCOUNTER — Ambulatory Visit (HOSPITAL_COMMUNITY): Payer: Medicaid Other | Admitting: Oncology

## 2015-07-17 NOTE — Progress Notes (Signed)
RT called to assess patient and help RN with BIPAP. Family at bedside. Patient had no respiratory effort and HR was dropping. Patient taken off BIPAP to listen for heart beat and respirations. No heart sounds or lung sounds noted.

## 2015-07-17 NOTE — Progress Notes (Signed)
In the room at the bedside with the family; patient had a period of apnea, went into a sinus brady, then asystole, no breath sounds or heart tones ausculted by RN or RT; time of 522

## 2015-07-17 NOTE — Progress Notes (Signed)
Patient continues to have increased resp rate, decreasing oxygen sats, and drop in blood pressure; patient is nonverbal and now not withdrawing to painful stimuli; Dr Cindie Laroche called and updated on change in patient condition, changes made to Bipap and FiO2, CT from earlier today, no new orders received at this time; patients mother called about change in condition; after discussion mother stated she would be on the way to the hospital; will continue to monitor and document any changes in condition

## 2015-07-17 NOTE — Progress Notes (Signed)
Patient's BIPAP settings have been turned up to 14/7 and 80%. Patient still maintaining an O2 sat of 88-91%. Breathing treatment given through BIPAP machine. Patient is unresponsive acting (will not talk or move). Respiratory rate continues to rise and lung volumes are dropping. RN in room at time of assessment.

## 2015-07-17 NOTE — Progress Notes (Signed)
Post mortem care and checklist completed; family given valuables and remains at the bedside; patient placement called to verify all information and to contact the funeral home; family will be waiting until funeral home arrives

## 2015-07-17 NOTE — Progress Notes (Signed)
Patient resp rate increasing with oxygen sats decreasing. Resp called to make changes to bipap and increase Fi02. Oral care completed and patient repositioned in the bed. Patient not as talkative as earlier but still responds to verbal stimuli. Will continue to monitor and document changes.

## 2015-07-17 NOTE — Progress Notes (Signed)
Pt left the floor with security staff as well as funeral home staff.  Family aware of transport to the funeral home.

## 2015-07-17 NOTE — Progress Notes (Signed)
Family arrived and are at the bedside; update on patient condition given to the family with questions answered; will continue to monitor and document any changes

## 2015-07-17 DEATH — deceased

## 2015-07-23 ENCOUNTER — Other Ambulatory Visit (HOSPITAL_COMMUNITY): Payer: Self-pay | Admitting: Oncology

## 2015-08-05 ENCOUNTER — Inpatient Hospital Stay (HOSPITAL_COMMUNITY): Payer: Medicaid Other

## 2015-08-05 ENCOUNTER — Ambulatory Visit (HOSPITAL_COMMUNITY): Payer: Medicaid Other | Admitting: Hematology & Oncology

## 2015-08-17 NOTE — Discharge Summary (Signed)
Physician Discharge Summary  Patient ID: Gerod Caligiuri MRN: [id number] DOB/AGE: Dec 08, 1960 55 y.o. 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 CONTRAST:  89m OMNIPAQUE IOHEXOL 300 MG/ML  SOLN COMPARISON:  06/19/2015, 06/01/2015 FINDINGS: Mediastinum/Nodes: Widened or adenopathy in the mediastinum and bilateral hila again identified without significant change when compared to 06/01/2015. Lungs/Pleura: Loculated moderate to large right pleural effusion is not significantly different when compared to the prior study. Particularly prominent loculation over the right apex is 6 cm in diameter and stable. On the left, a previously small pleural effusion has increased in size. There is now all a partially loculated moderate left pleural effusion. Extensive dense consolidation throughout the right lung again identified. There is little aerated right lung. Appearance of the lower lobe appears unchanged. There is further progressive right upper lobe consolidation with minimal aeration, and this process extends into the middle lobe. On the left, a very large upper lobe bleb is stable. Patchy multifocal airspace disease throughout the left upper  and lower lobe is worse when compared to 06/01/2015. Upper abdomen: There is at least a small volume of ascites partially visualized in the abdomen Musculoskeletal: Progressive lytic lesions throughout the thoracic spine, showing progression when compared to 06/01/2015. IMPRESSION: Similar but progressive abnormalities when compared to 06/01/2015 including progression of left pleural effusion and diffuse left airspace disease. Right effusion is stable with progression of right airspace disease. 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 Electronically Signed   By: RSkipper ClicheM.D.   On: 06/21/2015 11:41   Dg Chest Port 1 View  06/19/2015  CLINICAL DATA:  Respiratory failure.  Metastatic adenocarcinoma. EXAM: PORTABLE CHEST 1 VIEW COMPARISON:  Radiographs 06/06/2015 and 06/24/2015.  CT 06/01/2015. FINDINGS: 1023 hours. Right subclavian Port-A-Cath appears unchanged at the SVC right atrial level. There is stable near complete opacification of the right hemithorax with volume loss, complex pleural fluid and underlying pulmonary opacity. Airspace opacities throughout the left lung are stable, sparing the apex which is nearly completely replaced by a large bulla. The visualized mediastinal contours are stable. IMPRESSION: No significant change in the radiographic appearance of the chest. Persistent bilateral airspace opacities with near complete opacification of the right hemithorax. Electronically Signed   By: WRichardean SaleM.D.   On: 06/19/2015 11:06    Lab Results: Basic Metabolic Panel: No results for input(s): NA, K, CL, CO2, GLUCOSE, BUN, CREATININE, CALCIUM, MG, PHOS in the last 72 hours. Liver Function Tests: No results for input(s): AST, ALT, ALKPHOS, BILITOT, PROT, ALBUMIN in the last 72 hours.   CBC: No results for input(s): WBC, NEUTROABS, HGB, HCT, MCV, PLT in the last 72 hours.  No results found for this or any previous visit (from the past 240 hour(s)).   Hospital  Course:  This was a 55years old male patient with history of  multiple medical illnesses was admitted due to respiratory failure. 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 He had malignant pleural effusion and pneumonia.Pulmonary consult was done to assist in managing the patient. He was treated with IV antibiotics and BIPAP support. Patient was a DNR and his condition deteriorated. Patient expired on 03-Jul-2015.  Disposition: Funeral Home     Signed: Dwaine Pringle   07/18/2015, 5:02 PM

## 2017-04-03 IMAGING — DX DG CHEST 2V
2 series · 2 of 2 positions shown · non-contrast
Comparison: None.

CLINICAL DATA: Right-sided chest pain and shortness of breath since
yesterday.

EXAM:
CHEST  2 VIEW

[chest pa]
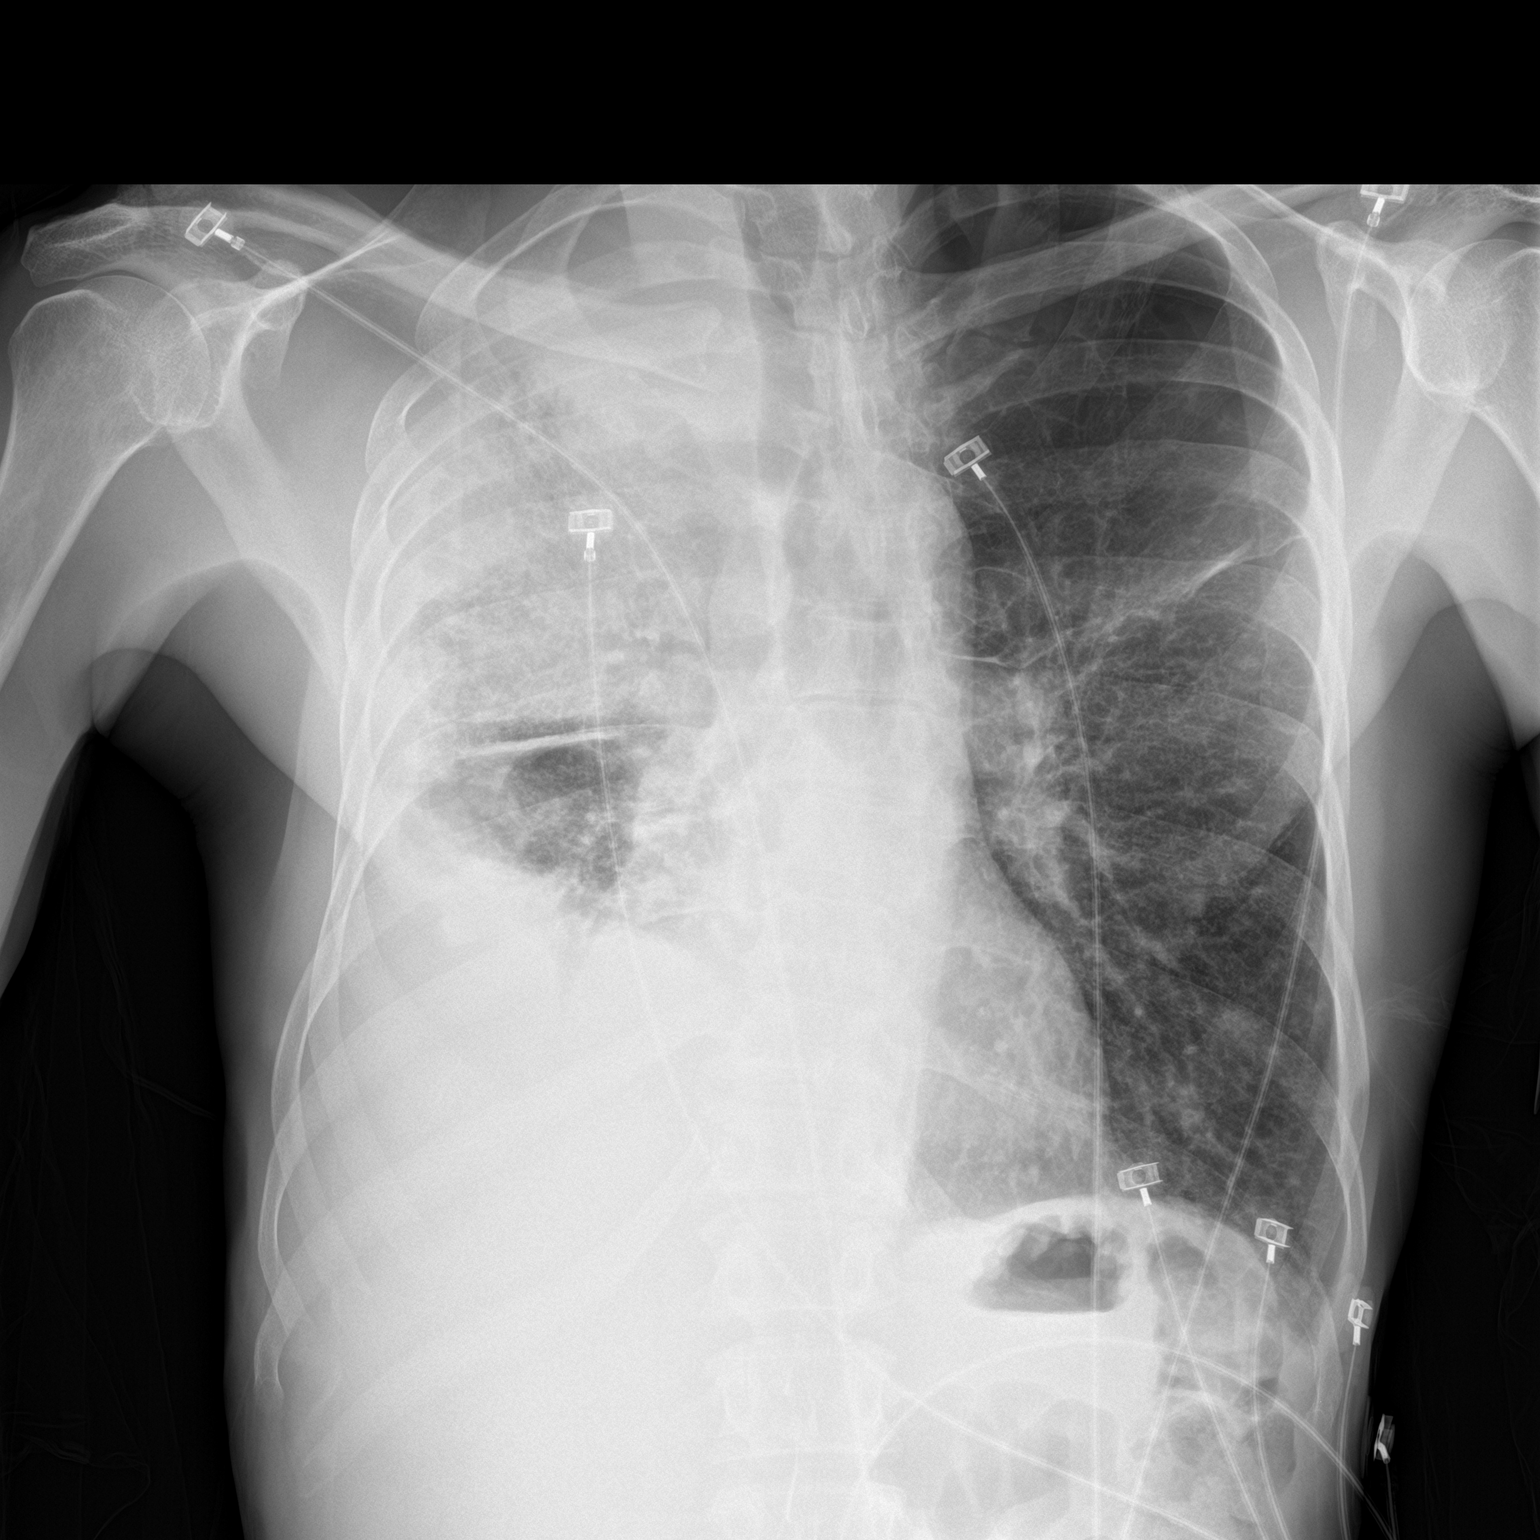

[chest lat]
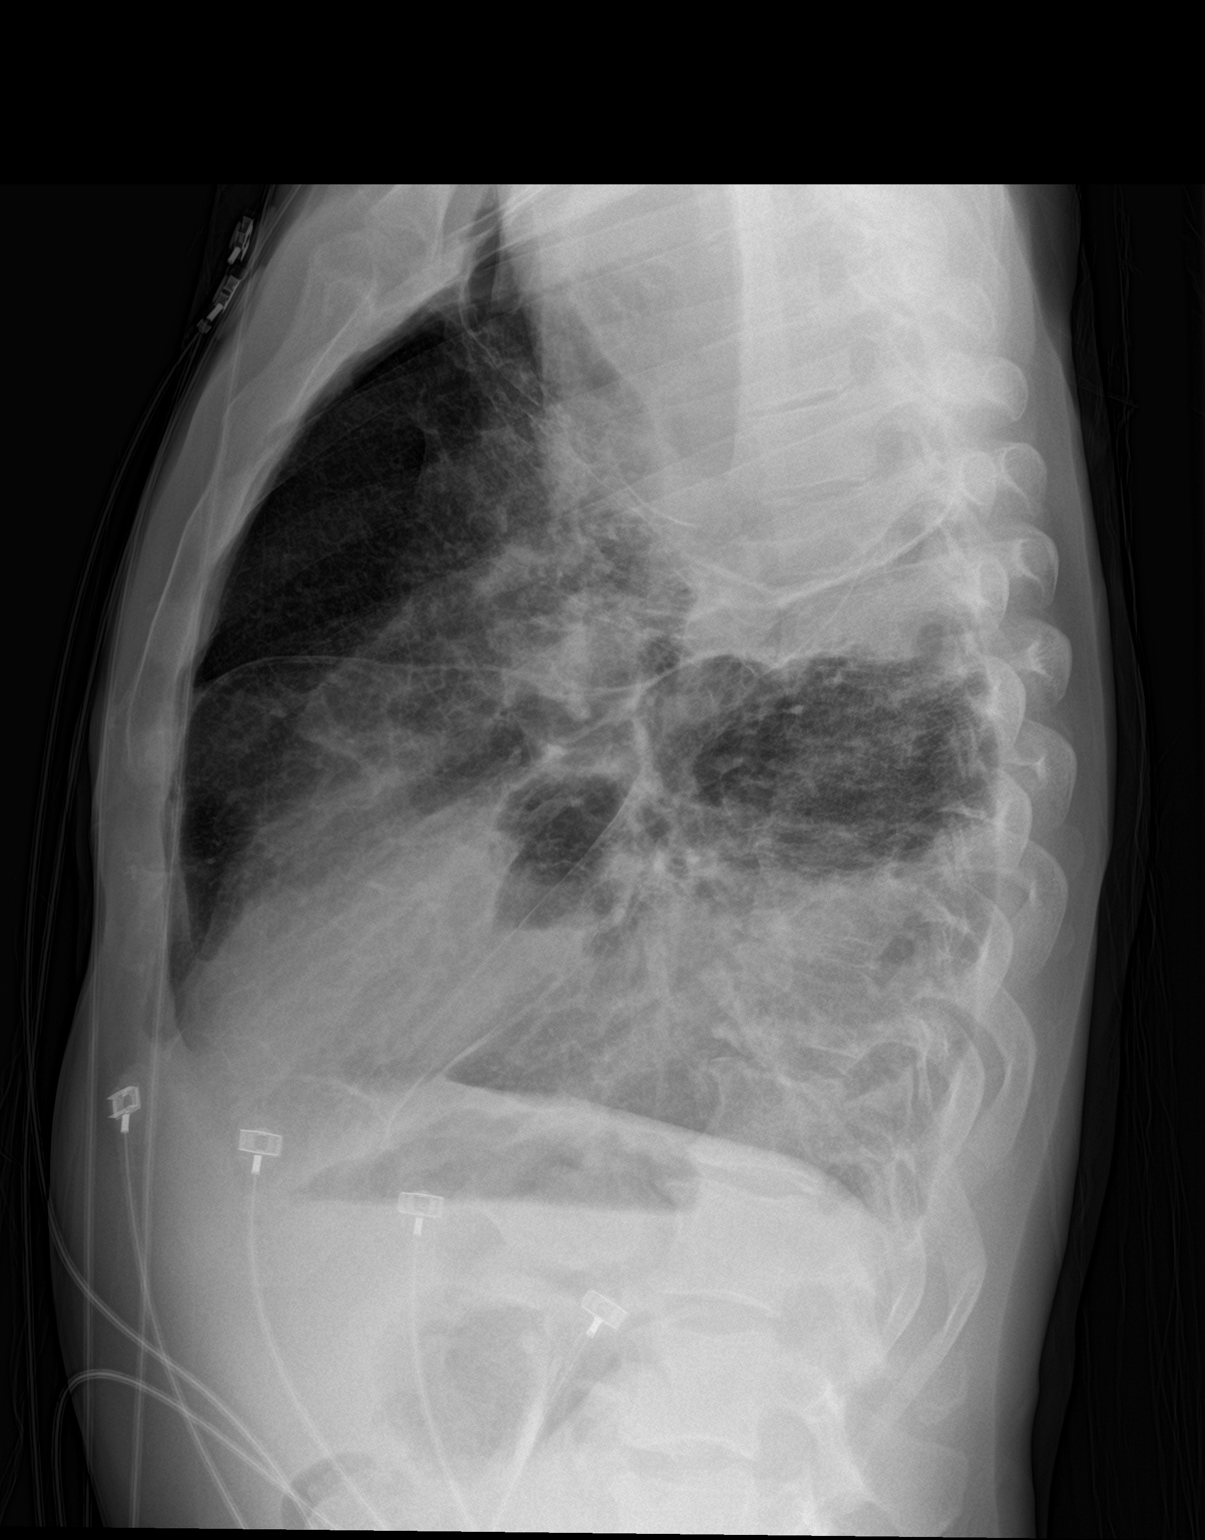

[2 of 2 positions shown; findings below may reference images not displayed]

FINDINGS: Cardiomediastinal silhouette is normal. Mediastinal contours appear
intact.

The lungs are hyperinflated, and there is evidence of emphysematous
changes with upper lobe predominance. There is a lucency within the
left lung apex, which may represent a pneumothorax versus bullous
changes. There is a small left pleural effusion. There is a large
right pleural effusion. There is near complete opacification of the
right upper lobe of the lung.

Osseous structures are without acute abnormality. Soft tissues are
grossly normal.
IMPRESSION: Lung emphysema.

Left apical pneumothorax versus bullous changes of the lung
parenchyma. Small left pleural effusion.

Large right pleural effusion. Near complete opacification of the
right upper lobe of the lung. This may represent infectious
consolidation, however endobronchial lesion causing postobstructive
pneumonia cannot be excluded.

CT of the chest may be considered.

## 2017-04-04 IMAGING — DX DG CHEST 1V
1 series · 1 of 1 positions shown · non-contrast
Comparison: 04/22/2015

CLINICAL DATA: Right thoracentesis.

EXAM:
CHEST 1 VIEW

[chest pa]
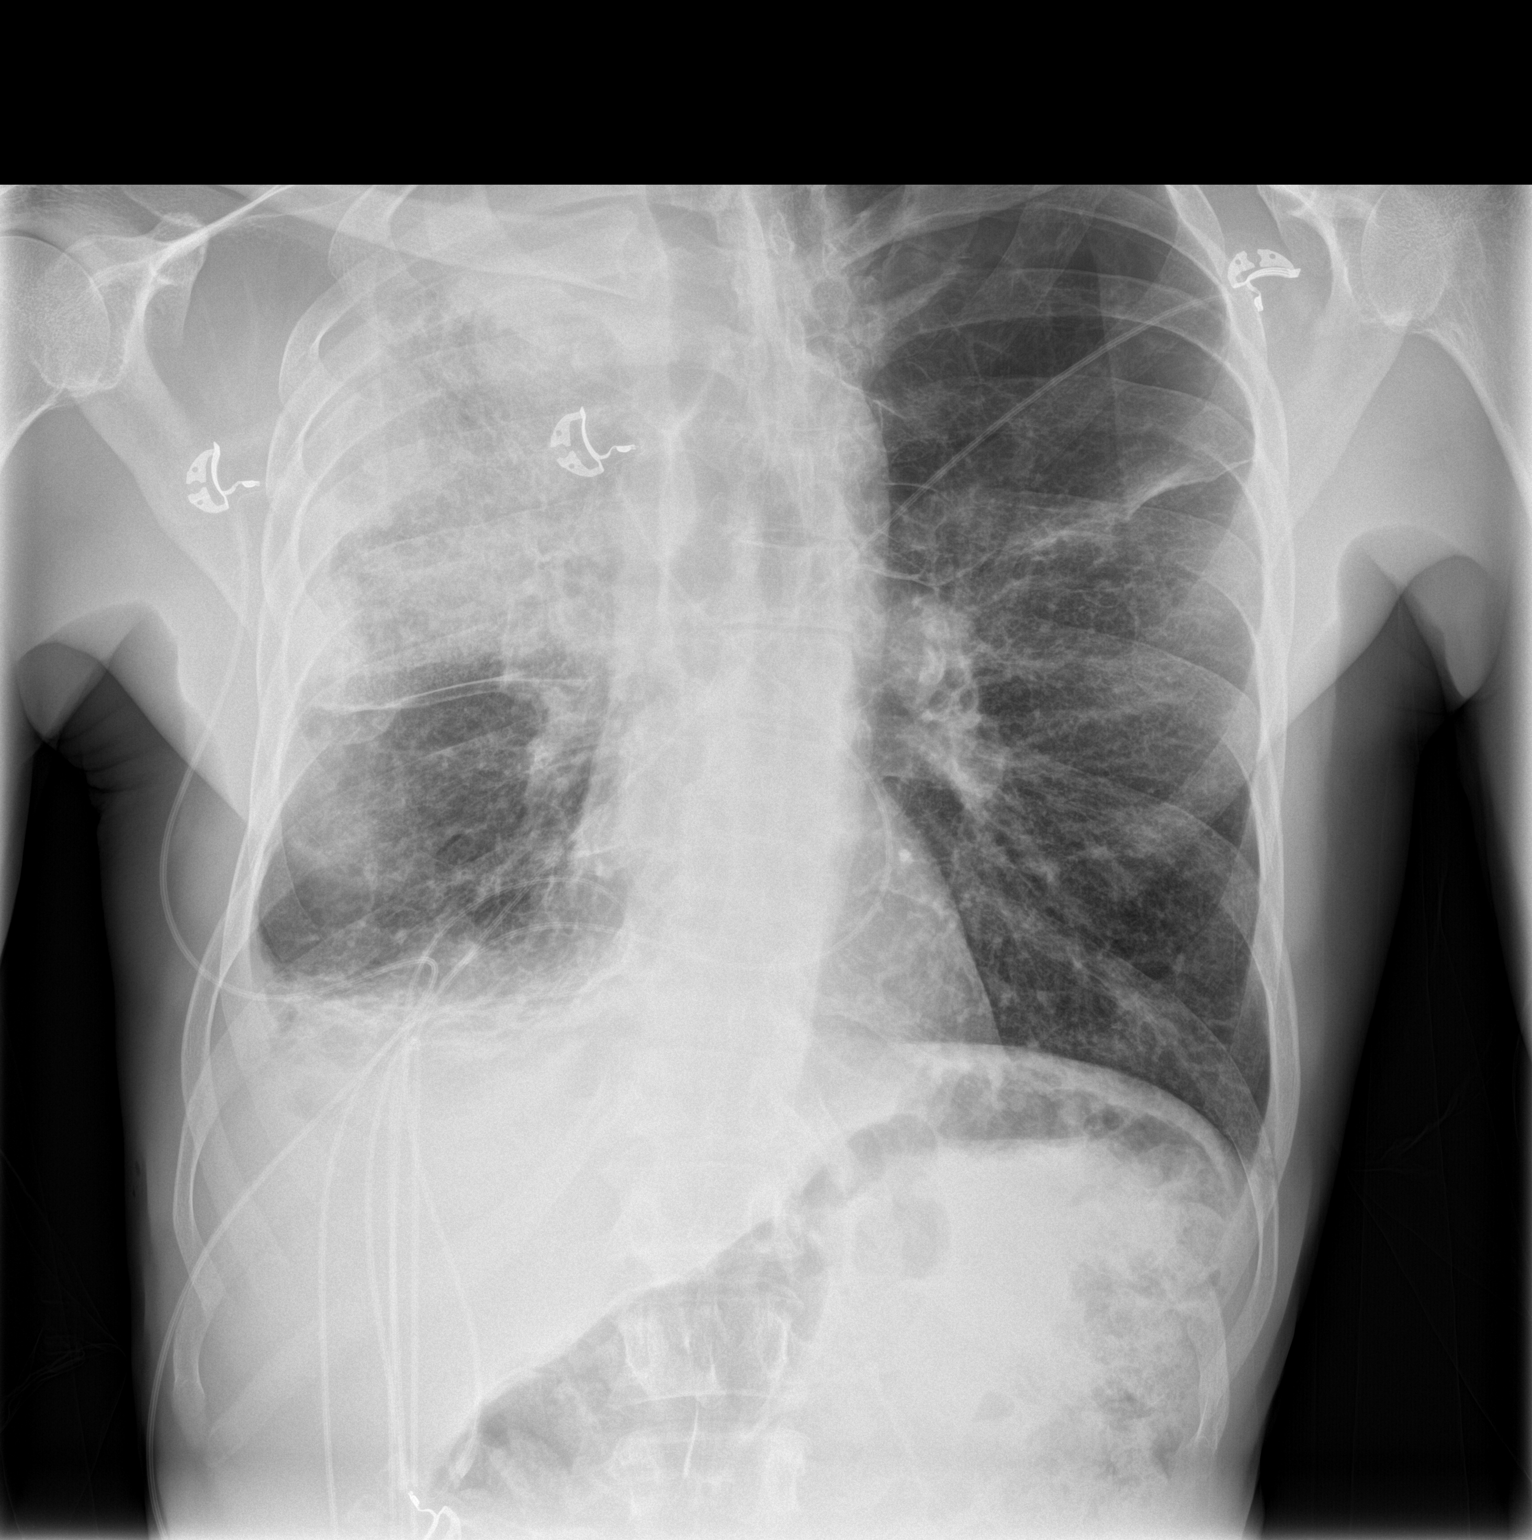

[1 of 1 positions shown; findings below may reference images not displayed]

FINDINGS: Much less pleural density in the right lower chest compared to the
yesterday study. Right upper lobe infiltrate with opacified bullous
disease at the right apex appears unchanged. Severe emphysema again
noted on the left with increased interstitial markings. No
pneumothorax post procedure.
IMPRESSION: No complication following right paracentesis. Much less pleural
density at the right lung base. Persistent right upper lobe
pneumonia with opacified bullous disease at the apex.

## 2017-04-20 IMAGING — DX DG CHEST 1V
1 series · 1 of 1 positions shown · non-contrast
Comparison: Expiratory chest radiograph compared to 05/04/2015 and
correlated with CT chest of 05/08/2015

CLINICAL DATA: Post RIGHT thoracentesis

EXAM:
CHEST 1 VIEW

[chest ap]
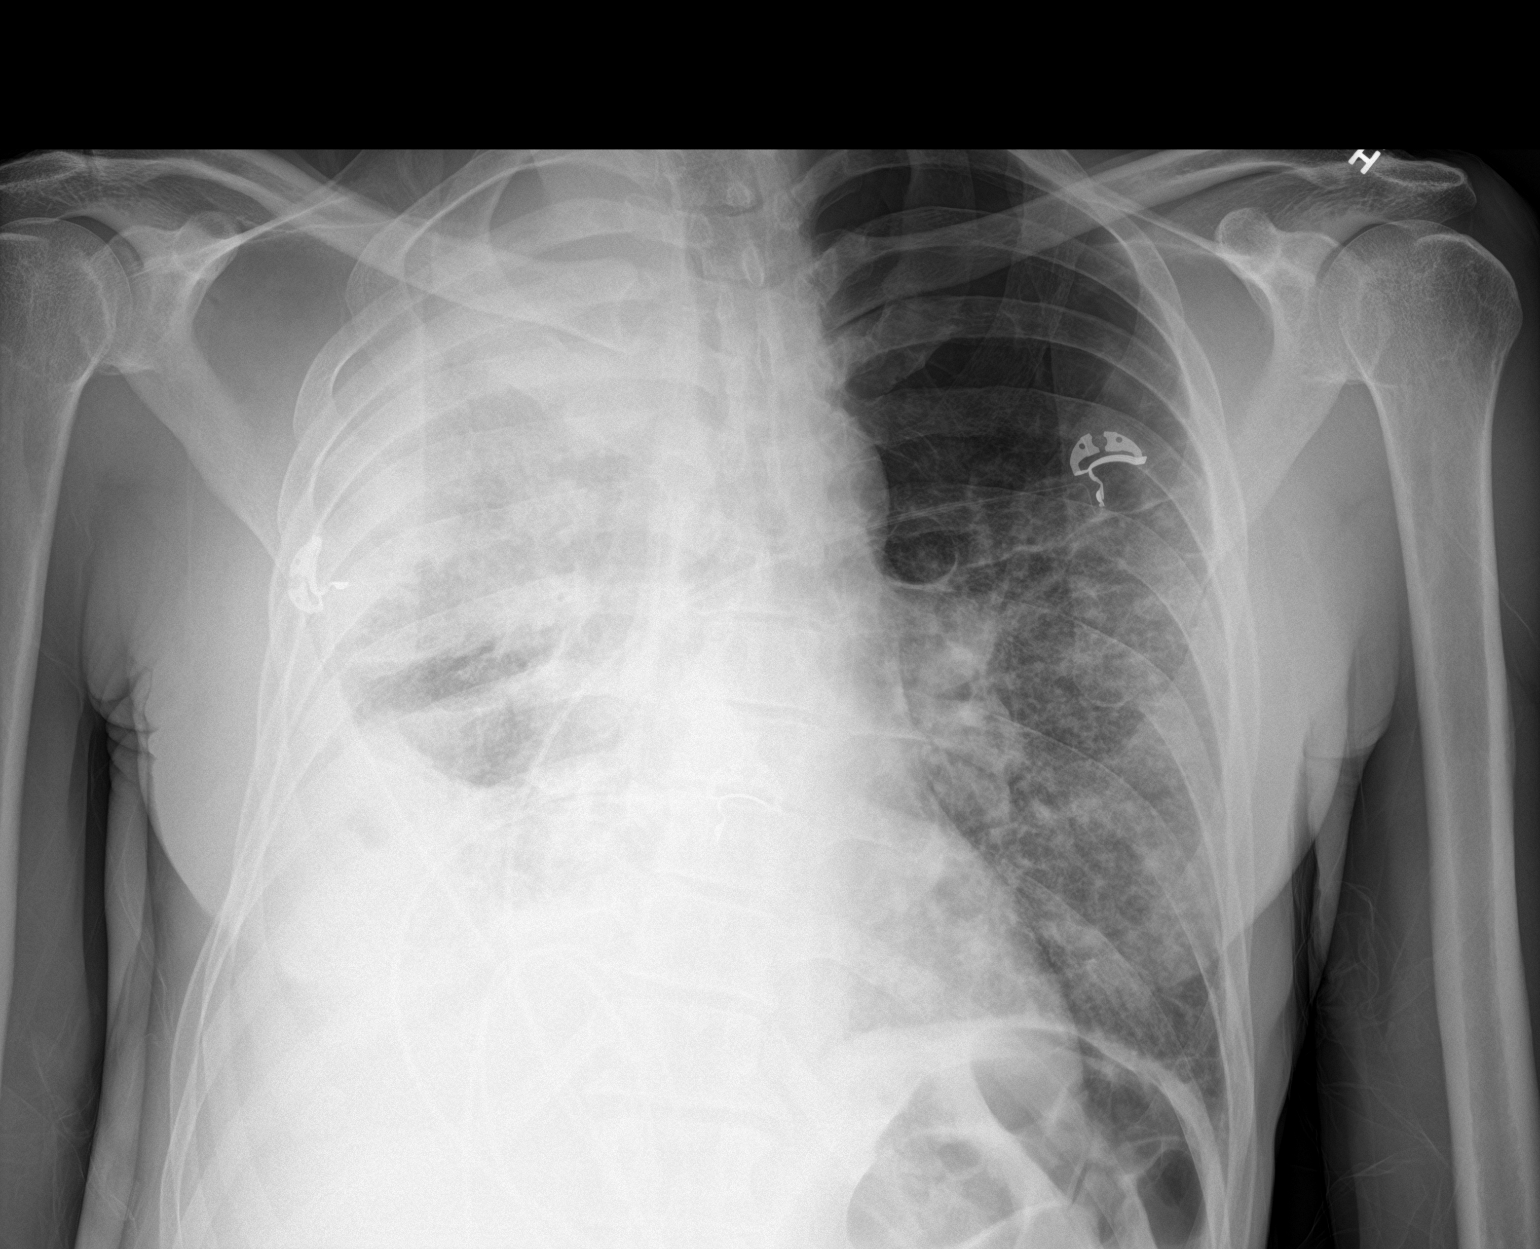

[1 of 1 positions shown; findings below may reference images not displayed]

FINDINGS: Stable heart size.

Persistent loculated RIGHT pleural effusion identified with
scattered areas of atelectasis and consolidation within RIGHT lung.

Underlying emphysematous changes with persistent infiltrates in the
mid to lower LEFT lung.

No pneumothorax identified.

No acute osseous findings.
IMPRESSION: No pneumothorax following RIGHT thoracentesis.

Persistent loculated RIGHT pleural effusion with areas of
atelectasis and consolidation of RIGHT lung.

Persistent infiltrate LEFT lower lobe.

Findings discussed with patient. Patient asymptomatic following
procedure.

## 2017-04-22 IMAGING — CR DG CHEST 1V PORT
1 series · 1 of 1 positions shown · non-contrast
Comparison: 05/09/2015

CLINICAL DATA: Right-sided Port-A-Cath insertion.

EXAM:
PORTABLE CHEST 1 VIEW

[ap portable]
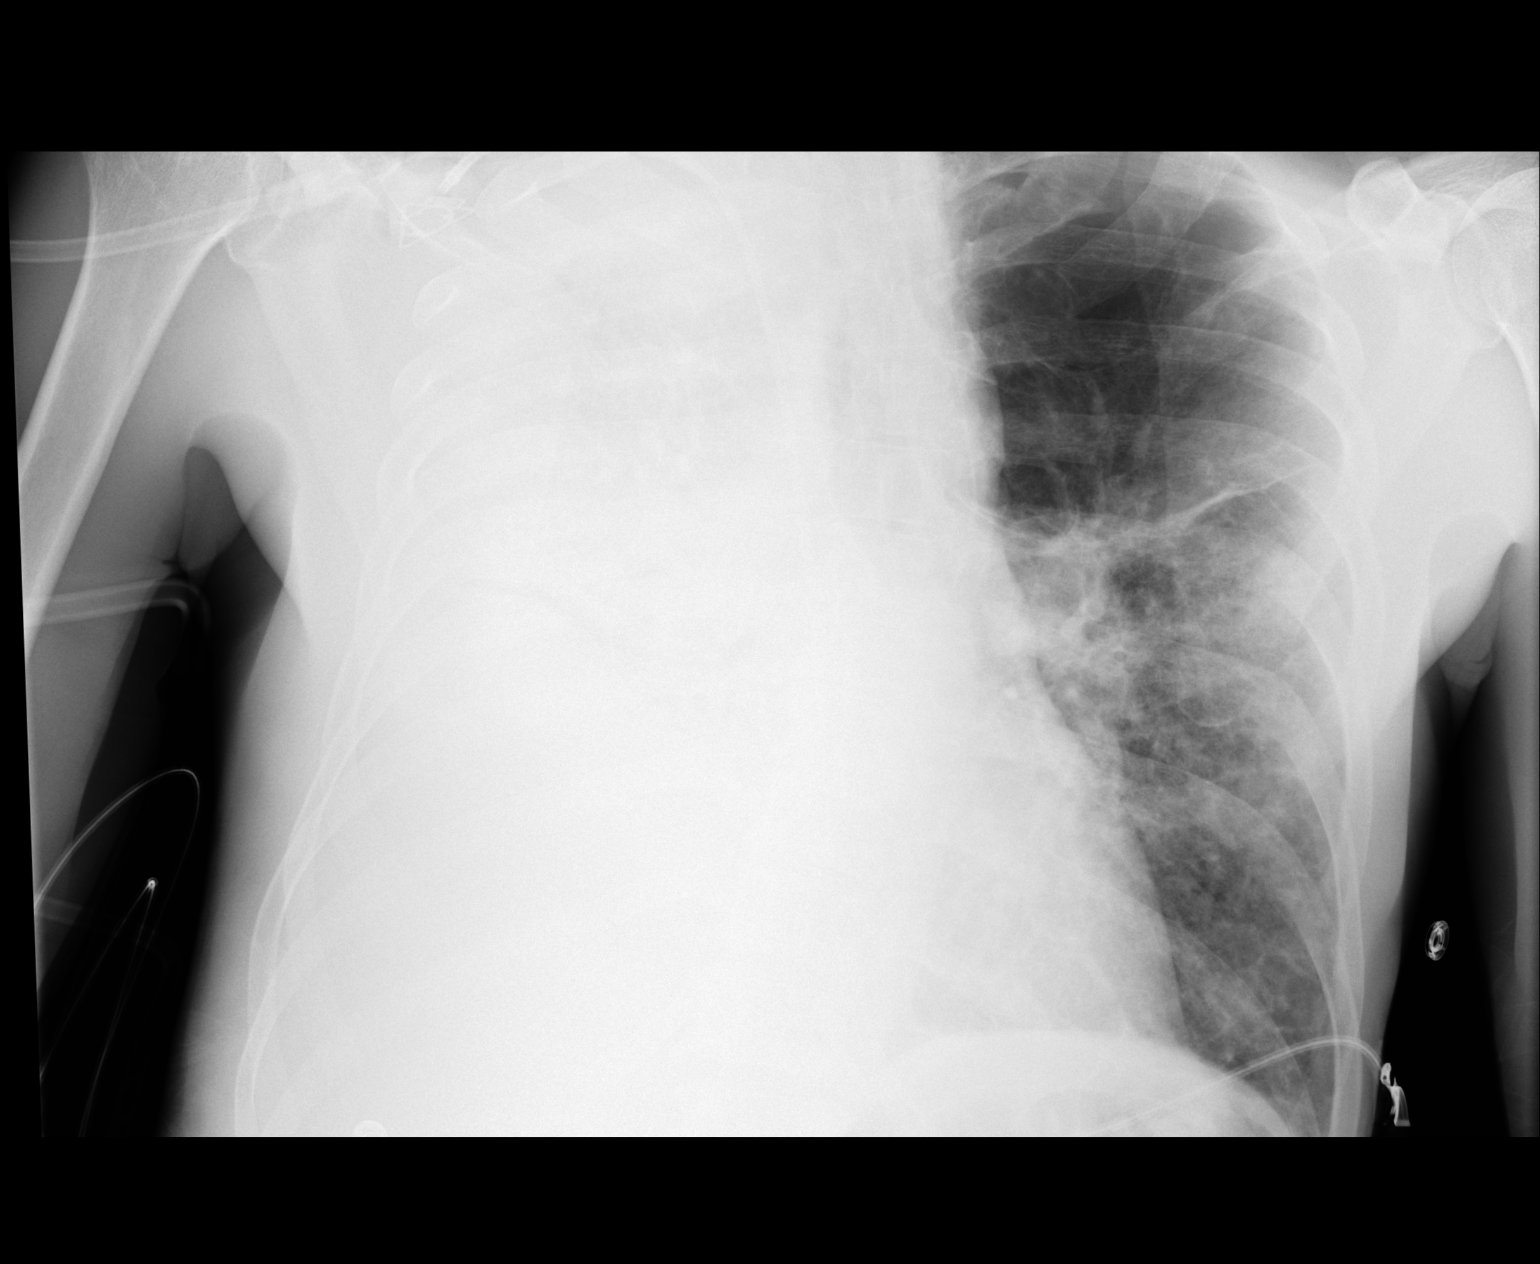

[1 of 1 positions shown; findings below may reference images not displayed]

FINDINGS: New right anterior chest wall Port-A-Cath has its catheter tip
projecting in the lower superior vena cava. There is no
pneumothorax.

Right hemi thorax is now almost completely opacified. Patchy opacity
persists in the left mid and lower lung zone, stable. No left
pleural effusion.
IMPRESSION: 1. Right Port-A-Cath is well positioned with its tip in the lower
superior vena cava.
2. There has been further worsening of lung aeration on the right
now with the right hemi thorax walls completely opacified.
3. No pneumothorax.
